# Patient Record
Sex: Female | Born: 1950 | Race: White | Hispanic: No | Smoking: Former smoker
Health system: Southern US, Community
[De-identification: ages and names within clinical notes are randomized; demographics above are authoritative.]

## PROBLEM LIST (undated history)

## (undated) DIAGNOSIS — Z8669 Personal history of other diseases of the nervous system and sense organs: Secondary | ICD-10-CM

## (undated) DIAGNOSIS — R350 Frequency of micturition: Secondary | ICD-10-CM

## (undated) DIAGNOSIS — M1990 Unspecified osteoarthritis, unspecified site: Secondary | ICD-10-CM

## (undated) DIAGNOSIS — R600 Localized edema: Secondary | ICD-10-CM

## (undated) DIAGNOSIS — R531 Weakness: Secondary | ICD-10-CM

## (undated) DIAGNOSIS — R51 Headache: Secondary | ICD-10-CM

## (undated) DIAGNOSIS — C801 Malignant (primary) neoplasm, unspecified: Secondary | ICD-10-CM

## (undated) DIAGNOSIS — M549 Dorsalgia, unspecified: Secondary | ICD-10-CM

## (undated) DIAGNOSIS — Z9289 Personal history of other medical treatment: Secondary | ICD-10-CM

## (undated) DIAGNOSIS — F32A Depression, unspecified: Secondary | ICD-10-CM

## (undated) DIAGNOSIS — R233 Spontaneous ecchymoses: Secondary | ICD-10-CM

## (undated) DIAGNOSIS — G629 Polyneuropathy, unspecified: Secondary | ICD-10-CM

## (undated) DIAGNOSIS — Z860100 Personal history of colon polyps, unspecified: Secondary | ICD-10-CM

## (undated) DIAGNOSIS — R238 Other skin changes: Secondary | ICD-10-CM

## (undated) DIAGNOSIS — F419 Anxiety disorder, unspecified: Secondary | ICD-10-CM

## (undated) DIAGNOSIS — R519 Headache, unspecified: Secondary | ICD-10-CM

## (undated) DIAGNOSIS — F329 Major depressive disorder, single episode, unspecified: Secondary | ICD-10-CM

## (undated) DIAGNOSIS — R3915 Urgency of urination: Secondary | ICD-10-CM

## (undated) DIAGNOSIS — M2550 Pain in unspecified joint: Secondary | ICD-10-CM

## (undated) DIAGNOSIS — G4700 Insomnia, unspecified: Secondary | ICD-10-CM

## (undated) DIAGNOSIS — Z8619 Personal history of other infectious and parasitic diseases: Secondary | ICD-10-CM

## (undated) DIAGNOSIS — K5900 Constipation, unspecified: Secondary | ICD-10-CM

## (undated) DIAGNOSIS — R609 Edema, unspecified: Secondary | ICD-10-CM

## (undated) DIAGNOSIS — K219 Gastro-esophageal reflux disease without esophagitis: Secondary | ICD-10-CM

## (undated) DIAGNOSIS — Z8614 Personal history of Methicillin resistant Staphylococcus aureus infection: Secondary | ICD-10-CM

## (undated) DIAGNOSIS — G8929 Other chronic pain: Secondary | ICD-10-CM

## (undated) HISTORY — PX: BUNIONECTOMY: SHX129

## (undated) HISTORY — PX: ESOPHAGOGASTRODUODENOSCOPY: SHX1529

## (undated) HISTORY — PX: OTHER SURGICAL HISTORY: SHX169

## (undated) HISTORY — DX: Malignant (primary) neoplasm, unspecified: C80.1

## (undated) HISTORY — PX: ROTATOR CUFF REPAIR: SHX139

## (undated) HISTORY — PX: ABDOMINAL HYSTERECTOMY: SHX81

## (undated) HISTORY — PX: TUBAL LIGATION: SHX77

## (undated) HISTORY — PX: TARSAL TUNNEL RELEASE: SUR1099

## (undated) HISTORY — PX: SPINE SURGERY: SHX786

## (undated) HISTORY — PX: COLONOSCOPY: SHX174

## (undated) HISTORY — PX: WRIST SURGERY: SHX841

## (undated) HISTORY — PX: CARPAL TUNNEL RELEASE: SHX101

---

## 1978-03-24 HISTORY — PX: APPENDECTOMY: SHX54

## 2004-03-24 HISTORY — PX: JOINT REPLACEMENT: SHX530

## 2007-03-25 DIAGNOSIS — Z8614 Personal history of Methicillin resistant Staphylococcus aureus infection: Secondary | ICD-10-CM

## 2007-03-25 DIAGNOSIS — Z8619 Personal history of other infectious and parasitic diseases: Secondary | ICD-10-CM

## 2007-03-25 HISTORY — DX: Personal history of Methicillin resistant Staphylococcus aureus infection: Z86.14

## 2007-03-25 HISTORY — DX: Personal history of other infectious and parasitic diseases: Z86.19

## 2008-04-26 ENCOUNTER — Inpatient Hospital Stay (HOSPITAL_COMMUNITY): Admission: RE | Admit: 2008-04-26 | Discharge: 2008-04-30 | Payer: Self-pay | Admitting: Orthopedic Surgery

## 2008-12-22 ENCOUNTER — Encounter: Admission: RE | Admit: 2008-12-22 | Discharge: 2008-12-22 | Payer: Self-pay | Admitting: Orthopedic Surgery

## 2009-05-10 ENCOUNTER — Ambulatory Visit (HOSPITAL_COMMUNITY): Admission: RE | Admit: 2009-05-10 | Discharge: 2009-05-11 | Payer: Self-pay | Admitting: Orthopedic Surgery

## 2010-02-06 ENCOUNTER — Inpatient Hospital Stay (HOSPITAL_COMMUNITY): Admission: EM | Admit: 2010-02-06 | Discharge: 2010-02-08 | Payer: Self-pay | Admitting: Emergency Medicine

## 2010-02-21 ENCOUNTER — Ambulatory Visit (HOSPITAL_COMMUNITY)
Admission: RE | Admit: 2010-02-21 | Discharge: 2010-02-21 | Payer: Self-pay | Source: Home / Self Care | Admitting: Orthopedic Surgery

## 2010-04-23 ENCOUNTER — Encounter
Admission: RE | Admit: 2010-04-23 | Discharge: 2010-04-23 | Payer: Self-pay | Source: Home / Self Care | Attending: Orthopedic Surgery | Admitting: Orthopedic Surgery

## 2010-04-28 IMAGING — CR DG LUMBAR SPINE 2-3V
2 series · 2 of 2 positions shown · non-contrast
Comparison: None

CLINICAL DATA: Scoliosis.  Stenosis.

LUMBAR SPINE - 2-3 VIEW

[view not recorded (1 of 2)]
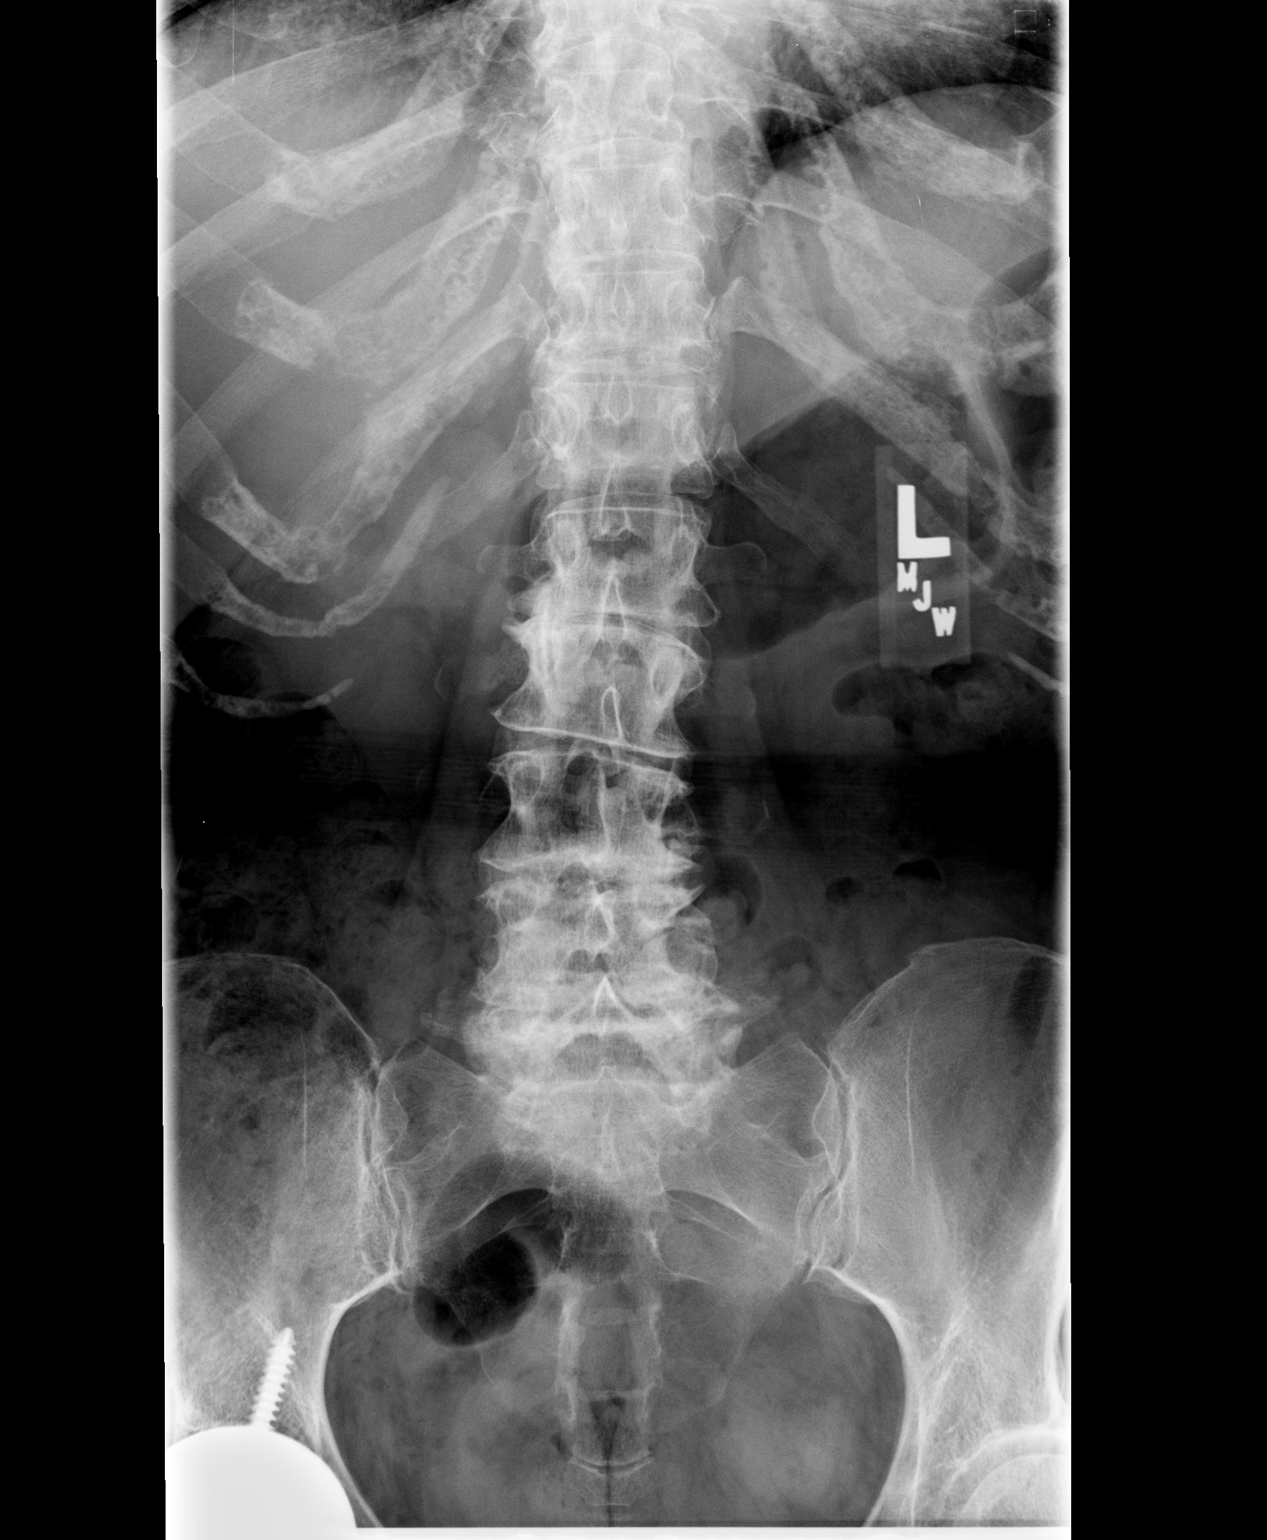

[view not recorded (2 of 2)]
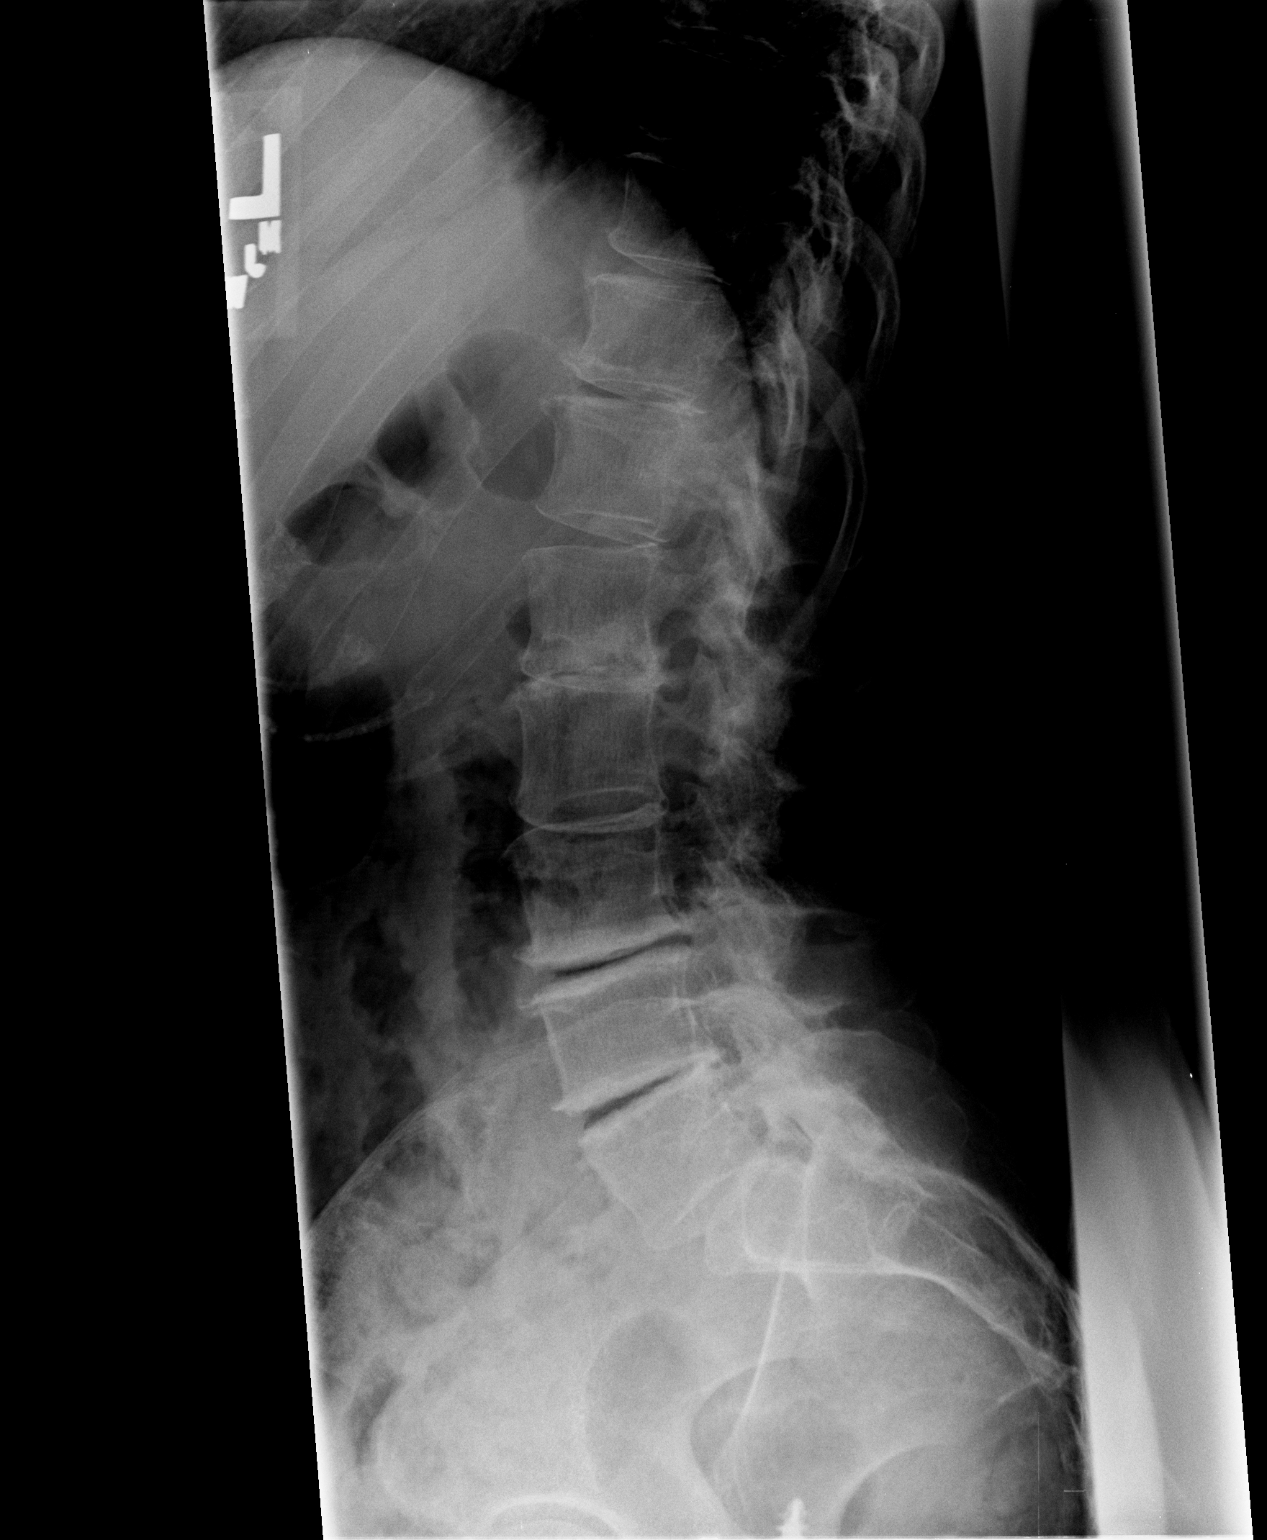

[2 of 2 positions shown; findings below may reference images not displayed]

FINDINGS: There is thoracolumbar scoliosis convex to the left and
lower lumbar scoliosis convex to the right.  There is disc space
narrowing throughout the lumbar spine.  There is facet arthropathy
in the lower lumbar spine.  There is 2 mm anterolisthesis of L5-S1.
IMPRESSION: Scoliosis, degenerative disc disease and degenerative facet
disease.  Levels marked for operative correlation.

## 2010-05-14 ENCOUNTER — Encounter (HOSPITAL_COMMUNITY)
Admission: RE | Admit: 2010-05-14 | Discharge: 2010-05-14 | Disposition: A | Discharge status: Home / Self Care | Payer: Medicare Other | Source: Ambulatory Visit | Attending: Orthopedic Surgery | Admitting: Orthopedic Surgery

## 2010-05-14 ENCOUNTER — Other Ambulatory Visit (HOSPITAL_COMMUNITY): Payer: Self-pay | Admitting: Orthopedic Surgery

## 2010-05-14 DIAGNOSIS — Z01812 Encounter for preprocedural laboratory examination: Secondary | ICD-10-CM | POA: Insufficient documentation

## 2010-05-14 DIAGNOSIS — Z01818 Encounter for other preprocedural examination: Secondary | ICD-10-CM

## 2010-05-14 LAB — CBC
HCT: 39.4 % (ref 36.0–46.0)
Hemoglobin: 13 g/dL (ref 12.0–15.0)
MCH: 29.5 pg (ref 26.0–34.0)
MCHC: 33 g/dL (ref 30.0–36.0)
MCV: 89.5 fL (ref 78.0–100.0)
Platelets: 331 10*3/uL (ref 150–400)
RBC: 4.4 MIL/uL (ref 3.87–5.11)
RDW: 14.4 % (ref 11.5–15.5)
WBC: 8 10*3/uL (ref 4.0–10.5)

## 2010-05-14 LAB — SURGICAL PCR SCREEN
MRSA, PCR: NEGATIVE
Staphylococcus aureus: NEGATIVE

## 2010-05-16 ENCOUNTER — Ambulatory Visit (HOSPITAL_COMMUNITY)
Admission: RE | Admit: 2010-05-16 | Discharge: 2010-05-17 | Disposition: A | Discharge status: Home / Self Care | Payer: Medicare Other | Source: Ambulatory Visit | Attending: Orthopedic Surgery | Admitting: Orthopedic Surgery

## 2010-05-16 ENCOUNTER — Ambulatory Visit (HOSPITAL_COMMUNITY): Payer: Medicare Other

## 2010-05-16 ENCOUNTER — Ambulatory Visit (HOSPITAL_COMMUNITY)
Admission: RE | Admit: 2010-05-16 | Discharge: 2010-05-16 | Disposition: A | Discharge status: Home / Self Care | Payer: Medicare Other | Source: Ambulatory Visit | Attending: Orthopedic Surgery | Admitting: Orthopedic Surgery

## 2010-05-16 ENCOUNTER — Other Ambulatory Visit (HOSPITAL_COMMUNITY): Payer: Self-pay | Admitting: Orthopedic Surgery

## 2010-05-16 DIAGNOSIS — IMO0001 Reserved for inherently not codable concepts without codable children: Secondary | ICD-10-CM | POA: Insufficient documentation

## 2010-05-16 DIAGNOSIS — H44009 Unspecified purulent endophthalmitis, unspecified eye: Secondary | ICD-10-CM | POA: Insufficient documentation

## 2010-05-16 DIAGNOSIS — Y838 Other surgical procedures as the cause of abnormal reaction of the patient, or of later complication, without mention of misadventure at the time of the procedure: Secondary | ICD-10-CM | POA: Insufficient documentation

## 2010-05-16 DIAGNOSIS — R52 Pain, unspecified: Secondary | ICD-10-CM

## 2010-05-16 DIAGNOSIS — K219 Gastro-esophageal reflux disease without esophagitis: Secondary | ICD-10-CM | POA: Insufficient documentation

## 2010-05-16 DIAGNOSIS — T8489XA Other specified complication of internal orthopedic prosthetic devices, implants and grafts, initial encounter: Secondary | ICD-10-CM | POA: Insufficient documentation

## 2010-05-16 DIAGNOSIS — Z87891 Personal history of nicotine dependence: Secondary | ICD-10-CM | POA: Insufficient documentation

## 2010-05-16 LAB — TYPE AND SCREEN
ABO/RH(D): A POS
Antibody Screen: NEGATIVE
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
## 2010-06-04 LAB — COMPREHENSIVE METABOLIC PANEL
ALT: 28 U/L (ref 0–35)
AST: 25 U/L (ref 0–37)
Albumin: 3 g/dL — ABNORMAL LOW (ref 3.5–5.2)
Alkaline Phosphatase: 42 U/L (ref 39–117)
BUN: 9 mg/dL (ref 6–23)
CO2: 23 mEq/L (ref 19–32)
Calcium: 7.9 mg/dL — ABNORMAL LOW (ref 8.4–10.5)
Chloride: 113 mEq/L — ABNORMAL HIGH (ref 96–112)
Creatinine, Ser: 0.53 mg/dL (ref 0.4–1.2)
GFR calc Af Amer: >60 mL/min (ref >60)
GFR calc non Af Amer: >60 mL/min (ref >60)
Glucose, Bld: 73 mg/dL (ref 70–99)
Potassium: 3 mEq/L — ABNORMAL LOW (ref 3.5–5.1)
Sodium: 142 mEq/L (ref 135–145)
Total Bilirubin: 0.4 mg/dL (ref 0.3–1.2)
Total Protein: 5.3 g/dL — ABNORMAL LOW (ref 6.0–8.3)

## 2010-06-04 LAB — DIFFERENTIAL
Basophils Absolute: 0 10*3/uL (ref 0.0–0.1)
Basophils Relative: 0 % (ref 0–1)
Eosinophils Absolute: 0 10*3/uL (ref 0.0–0.7)
Eosinophils Relative: 0 % (ref 0–5)
Lymphocytes Relative: 10 % — ABNORMAL LOW (ref 12–46)
Lymphs Abs: 1.2 10*3/uL (ref 0.7–4.0)
Monocytes Absolute: 1 10*3/uL (ref 0.1–1.0)
Monocytes Relative: 8 % (ref 3–12)
Neutro Abs: 10.6 10*3/uL — ABNORMAL HIGH (ref 1.7–7.7)
Neutrophils Relative %: 82 % — ABNORMAL HIGH (ref 43–77)

## 2010-06-04 LAB — CBC
HCT: 31.6 % — ABNORMAL LOW (ref 36.0–46.0)
HCT: 33.7 % — ABNORMAL LOW (ref 36.0–46.0)
Hemoglobin: 10.9 g/dL — ABNORMAL LOW (ref 12.0–15.0)
Hemoglobin: 11.5 g/dL — ABNORMAL LOW (ref 12.0–15.0)
MCH: 33 pg (ref 26.0–34.0)
MCH: 33.4 pg (ref 26.0–34.0)
MCHC: 34.1 g/dL (ref 30.0–36.0)
MCHC: 34.5 g/dL (ref 30.0–36.0)
MCV: 96.6 fL (ref 78.0–100.0)
MCV: 96.9 fL (ref 78.0–100.0)
Platelets: 204 10*3/uL (ref 150–400)
Platelets: 402 10*3/uL — ABNORMAL HIGH (ref 150–400)
RBC: 3.26 MIL/uL — ABNORMAL LOW (ref 3.87–5.11)
RBC: 3.49 MIL/uL — ABNORMAL LOW (ref 3.87–5.11)
RDW: 12.4 % (ref 11.5–15.5)
RDW: 12.5 % (ref 11.5–15.5)
WBC: 12.9 10*3/uL — ABNORMAL HIGH (ref 4.0–10.5)
WBC: 7.5 10*3/uL (ref 4.0–10.5)

## 2010-06-04 LAB — APTT: aPTT: 29 seconds (ref 24–37)

## 2010-06-04 LAB — SURGICAL PCR SCREEN
MRSA, PCR: NEGATIVE
Staphylococcus aureus: NEGATIVE

## 2010-06-04 LAB — PROTIME-INR
INR: 1.13 (ref 0.00–1.49)
Prothrombin Time: 14.7 seconds (ref 11.6–15.2)

## 2010-06-04 LAB — MRSA PCR SCREENING: MRSA by PCR: NEGATIVE

## 2010-06-12 LAB — CBC
HCT: 40.5 % (ref 36.0–46.0)
Hemoglobin: 14 g/dL (ref 12.0–15.0)
MCHC: 34.5 g/dL (ref 30.0–36.0)
MCV: 95.3 fL (ref 78.0–100.0)
Platelets: 254 10*3/uL (ref 150–400)
RBC: 4.25 MIL/uL (ref 3.87–5.11)
RDW: 12.4 % (ref 11.5–15.5)
WBC: 6.6 10*3/uL (ref 4.0–10.5)

## 2010-06-12 LAB — BASIC METABOLIC PANEL
BUN: 9 mg/dL (ref 6–23)
CO2: 28 mEq/L (ref 19–32)
Calcium: 10.2 mg/dL (ref 8.4–10.5)
Chloride: 106 mEq/L (ref 96–112)
Creatinine, Ser: 0.49 mg/dL (ref 0.4–1.2)
GFR calc Af Amer: >60 mL/min (ref >60)
GFR calc non Af Amer: >60 mL/min (ref >60)
Glucose, Bld: 98 mg/dL (ref 70–99)
Potassium: 4.3 mEq/L (ref 3.5–5.1)
Sodium: 142 mEq/L (ref 135–145)

## 2010-07-08 LAB — TYPE AND SCREEN
ABO/RH(D): A POS
Antibody Screen: NEGATIVE

## 2010-07-08 LAB — CBC
HCT: 41.3 % (ref 36.0–46.0)
Hemoglobin: 14.2 g/dL (ref 12.0–15.0)
MCHC: 34.3 g/dL (ref 30.0–36.0)
MCV: 96.2 fL (ref 78.0–100.0)
Platelets: 350 10*3/uL (ref 150–400)
RBC: 4.29 MIL/uL (ref 3.87–5.11)
RDW: 12.2 % (ref 11.5–15.5)
WBC: 8.8 10*3/uL (ref 4.0–10.5)

## 2010-07-08 LAB — ABO/RH: ABO/RH(D): A POS

## 2010-07-09 LAB — PROTIME-INR
INR: 1.1 (ref 0.00–1.49)
Prothrombin Time: 14.4 seconds (ref 11.6–15.2)

## 2010-07-09 LAB — DIFFERENTIAL
Basophils Absolute: 0 10*3/uL (ref 0.0–0.1)
Basophils Absolute: 0 10*3/uL (ref 0.0–0.1)
Basophils Absolute: 0 10*3/uL (ref 0.0–0.1)
Basophils Absolute: 0 10*3/uL (ref 0.0–0.1)
Basophils Absolute: 0.5 10*3/uL — ABNORMAL HIGH (ref 0.0–0.1)
Basophils Relative: 0 % (ref 0–1)
Basophils Relative: 0 % (ref 0–1)
Basophils Relative: 0 % (ref 0–1)
Basophils Relative: 0 % (ref 0–1)
Basophils Relative: 6 % — ABNORMAL HIGH (ref 0–1)
Eosinophils Absolute: 0 10*3/uL (ref 0.0–0.7)
Eosinophils Absolute: 0 10*3/uL (ref 0.0–0.7)
Eosinophils Absolute: 0 10*3/uL (ref 0.0–0.7)
Eosinophils Absolute: 0.2 10*3/uL (ref 0.0–0.7)
Eosinophils Absolute: 0.2 10*3/uL (ref 0.0–0.7)
Eosinophils Relative: 0 % (ref 0–5)
Eosinophils Relative: 0 % (ref 0–5)
Eosinophils Relative: 0 % (ref 0–5)
Eosinophils Relative: 2 % (ref 0–5)
Eosinophils Relative: 3 % (ref 0–5)
Lymphocytes Relative: 13 % (ref 12–46)
Lymphocytes Relative: 17 % (ref 12–46)
Lymphocytes Relative: 19 % (ref 12–46)
Lymphocytes Relative: 5 % — ABNORMAL LOW (ref 12–46)
Lymphocytes Relative: 7 % — ABNORMAL LOW (ref 12–46)
Lymphs Abs: 0.6 10*3/uL — ABNORMAL LOW (ref 0.7–4.0)
Lymphs Abs: 0.8 10*3/uL (ref 0.7–4.0)
Lymphs Abs: 1.3 10*3/uL (ref 0.7–4.0)
Lymphs Abs: 1.4 10*3/uL (ref 0.7–4.0)
Lymphs Abs: 1.5 10*3/uL (ref 0.7–4.0)
Monocytes Absolute: 0.5 10*3/uL (ref 0.1–1.0)
Monocytes Absolute: 0.6 10*3/uL (ref 0.1–1.0)
Monocytes Absolute: 0.8 10*3/uL (ref 0.1–1.0)
Monocytes Absolute: 1 10*3/uL (ref 0.1–1.0)
Monocytes Absolute: 1 10*3/uL (ref 0.1–1.0)
Monocytes Relative: 10 % (ref 3–12)
Monocytes Relative: 10 % (ref 3–12)
Monocytes Relative: 4 % (ref 3–12)
Monocytes Relative: 7 % (ref 3–12)
Monocytes Relative: 8 % (ref 3–12)
Neutro Abs: 10.6 10*3/uL — ABNORMAL HIGH (ref 1.7–7.7)
Neutro Abs: 11.6 10*3/uL — ABNORMAL HIGH (ref 1.7–7.7)
Neutro Abs: 5.3 10*3/uL (ref 1.7–7.7)
Neutro Abs: 6 10*3/uL (ref 1.7–7.7)
Neutro Abs: 7.7 10*3/uL (ref 1.7–7.7)
Neutrophils Relative %: 66 % (ref 43–77)
Neutrophils Relative %: 71 % (ref 43–77)
Neutrophils Relative %: 77 % (ref 43–77)
Neutrophils Relative %: 85 % — ABNORMAL HIGH (ref 43–77)
Neutrophils Relative %: 91 % — ABNORMAL HIGH (ref 43–77)

## 2010-07-09 LAB — BASIC METABOLIC PANEL
BUN: 4 mg/dL — ABNORMAL LOW (ref 6–23)
BUN: 5 mg/dL — ABNORMAL LOW (ref 6–23)
BUN: 6 mg/dL (ref 6–23)
BUN: 6 mg/dL (ref 6–23)
BUN: 6 mg/dL (ref 6–23)
CO2: 23 mEq/L (ref 19–32)
CO2: 29 mEq/L (ref 19–32)
CO2: 29 mEq/L (ref 19–32)
CO2: 29 mEq/L (ref 19–32)
CO2: 31 mEq/L (ref 19–32)
Calcium: 6.8 mg/dL — ABNORMAL LOW (ref 8.4–10.5)
Calcium: 7.2 mg/dL — ABNORMAL LOW (ref 8.4–10.5)
Calcium: 7.6 mg/dL — ABNORMAL LOW (ref 8.4–10.5)
Calcium: 7.7 mg/dL — ABNORMAL LOW (ref 8.4–10.5)
Calcium: 7.7 mg/dL — ABNORMAL LOW (ref 8.4–10.5)
Chloride: 100 mEq/L (ref 96–112)
Chloride: 102 mEq/L (ref 96–112)
Chloride: 111 mEq/L (ref 96–112)
Chloride: 96 mEq/L (ref 96–112)
Chloride: 98 mEq/L (ref 96–112)
Creatinine, Ser: 0.37 mg/dL — ABNORMAL LOW (ref 0.4–1.2)
Creatinine, Ser: 0.4 mg/dL (ref 0.4–1.2)
Creatinine, Ser: 0.44 mg/dL (ref 0.4–1.2)
Creatinine, Ser: 0.46 mg/dL (ref 0.4–1.2)
Creatinine, Ser: 0.5 mg/dL (ref 0.4–1.2)
GFR calc Af Amer: >60 mL/min (ref >60)
GFR calc Af Amer: >60 mL/min (ref >60)
GFR calc Af Amer: >60 mL/min (ref >60)
GFR calc Af Amer: >60 mL/min (ref >60)
GFR calc Af Amer: >60 mL/min (ref >60)
GFR calc non Af Amer: >60 mL/min (ref >60)
GFR calc non Af Amer: >60 mL/min (ref >60)
GFR calc non Af Amer: >60 mL/min (ref >60)
GFR calc non Af Amer: >60 mL/min (ref >60)
GFR calc non Af Amer: >60 mL/min (ref >60)
Glucose, Bld: 118 mg/dL — ABNORMAL HIGH (ref 70–99)
Glucose, Bld: 133 mg/dL — ABNORMAL HIGH (ref 70–99)
Glucose, Bld: 138 mg/dL — ABNORMAL HIGH (ref 70–99)
Glucose, Bld: 91 mg/dL (ref 70–99)
Glucose, Bld: 91 mg/dL (ref 70–99)
Potassium: 3.4 mEq/L — ABNORMAL LOW (ref 3.5–5.1)
Potassium: 3.6 mEq/L (ref 3.5–5.1)
Potassium: 3.6 mEq/L (ref 3.5–5.1)
Potassium: 3.9 mEq/L (ref 3.5–5.1)
Potassium: 4.2 mEq/L (ref 3.5–5.1)
Sodium: 130 mEq/L — ABNORMAL LOW (ref 135–145)
Sodium: 132 mEq/L — ABNORMAL LOW (ref 135–145)
Sodium: 136 mEq/L (ref 135–145)
Sodium: 136 mEq/L (ref 135–145)
Sodium: 137 mEq/L (ref 135–145)

## 2010-07-09 LAB — CROSSMATCH
ABO/RH(D): A POS
Antibody Screen: NEGATIVE

## 2010-07-09 LAB — POCT I-STAT 7, (LYTES, BLD GAS, ICA,H+H)
Acid-Base Excess: 3 mmol/L — ABNORMAL HIGH (ref 0.0–2.0)
Bicarbonate: 27.4 mEq/L — ABNORMAL HIGH (ref 20.0–24.0)
Calcium, Ion: 1.17 mmol/L (ref 1.12–1.32)
HCT: 22 % — ABNORMAL LOW (ref 36.0–46.0)
Hemoglobin: 7.5 g/dL — CL (ref 12.0–15.0)
O2 Saturation: 100 %
Patient temperature: 37
Potassium: 3.9 mEq/L (ref 3.5–5.1)
Sodium: 139 mEq/L (ref 135–145)
TCO2: 29 mmol/L (ref 0–100)
pCO2 arterial: 41.8 mmHg (ref 35.0–45.0)
pH, Arterial: 7.424 — ABNORMAL HIGH (ref 7.350–7.400)
pO2, Arterial: 453 mmHg — ABNORMAL HIGH (ref 80.0–100.0)

## 2010-07-09 LAB — CBC
HCT: 19.5 % — ABNORMAL LOW (ref 36.0–46.0)
HCT: 25 % — ABNORMAL LOW (ref 36.0–46.0)
HCT: 25.6 % — ABNORMAL LOW (ref 36.0–46.0)
HCT: 27.6 % — ABNORMAL LOW (ref 36.0–46.0)
HCT: 27.9 % — ABNORMAL LOW (ref 36.0–46.0)
Hemoglobin: 7 g/dL — CL (ref 12.0–15.0)
Hemoglobin: 8.8 g/dL — ABNORMAL LOW (ref 12.0–15.0)
Hemoglobin: 9.1 g/dL — ABNORMAL LOW (ref 12.0–15.0)
Hemoglobin: 9.8 g/dL — ABNORMAL LOW (ref 12.0–15.0)
Hemoglobin: 9.9 g/dL — ABNORMAL LOW (ref 12.0–15.0)
MCHC: 35.2 g/dL (ref 30.0–36.0)
MCHC: 35.3 g/dL (ref 30.0–36.0)
MCHC: 35.4 g/dL (ref 30.0–36.0)
MCHC: 35.5 g/dL (ref 30.0–36.0)
MCHC: 36.1 g/dL — ABNORMAL HIGH (ref 30.0–36.0)
MCV: 91.4 fL (ref 78.0–100.0)
MCV: 91.8 fL (ref 78.0–100.0)
MCV: 92.7 fL (ref 78.0–100.0)
MCV: 92.9 fL (ref 78.0–100.0)
MCV: 93.1 fL (ref 78.0–100.0)
Platelets: 104 10*3/uL — ABNORMAL LOW (ref 150–400)
Platelets: 109 10*3/uL — ABNORMAL LOW (ref 150–400)
Platelets: 122 10*3/uL — ABNORMAL LOW (ref 150–400)
Platelets: 146 10*3/uL — ABNORMAL LOW (ref 150–400)
Platelets: 94 10*3/uL — ABNORMAL LOW (ref 150–400)
RBC: 2.1 MIL/uL — ABNORMAL LOW (ref 3.87–5.11)
RBC: 2.72 MIL/uL — ABNORMAL LOW (ref 3.87–5.11)
RBC: 2.8 MIL/uL — ABNORMAL LOW (ref 3.87–5.11)
RBC: 2.98 MIL/uL — ABNORMAL LOW (ref 3.87–5.11)
RBC: 2.99 MIL/uL — ABNORMAL LOW (ref 3.87–5.11)
RDW: 13.1 % (ref 11.5–15.5)
RDW: 13.9 % (ref 11.5–15.5)
RDW: 14 % (ref 11.5–15.5)
RDW: 14.5 % (ref 11.5–15.5)
RDW: 15 % (ref 11.5–15.5)
WBC: 10 10*3/uL (ref 4.0–10.5)
WBC: 12.4 10*3/uL — ABNORMAL HIGH (ref 4.0–10.5)
WBC: 12.7 10*3/uL — ABNORMAL HIGH (ref 4.0–10.5)
WBC: 8 10*3/uL (ref 4.0–10.5)
WBC: 8.4 10*3/uL (ref 4.0–10.5)

## 2010-07-09 LAB — POCT I-STAT 4, (NA,K, GLUC, HGB,HCT)
Glucose, Bld: 112 mg/dL — ABNORMAL HIGH (ref 70–99)
HCT: 25 % — ABNORMAL LOW (ref 36.0–46.0)
Hemoglobin: 8.5 g/dL — ABNORMAL LOW (ref 12.0–15.0)
Potassium: 4.2 mEq/L (ref 3.5–5.1)
Sodium: 140 mEq/L (ref 135–145)

## 2010-07-09 LAB — BLEEDING TIME: Bleeding Time: 10.5 minutes — ABNORMAL HIGH (ref 2.5–9.5)

## 2010-07-09 LAB — HEMATOCRIT: HCT: 26.8 % — ABNORMAL LOW (ref 36.0–46.0)

## 2010-07-09 LAB — APTT: aPTT: 34 seconds (ref 24–37)

## 2010-08-06 NOTE — Op Note (Signed)
Amanda Guerrero, Amanda Guerrero                ACCOUNT NO.:  [id number]   MEDICAL RECORD NO.:  [id number]          PATIENT TYPE:  INP   LOCATION:  5040                         FACILITY:  [facility]   PHYSICIAN:  Alvy Beal, MD    DATE OF BIRTH:  1950/05/25   DATE OF PROCEDURE:  04/26/2008  DATE OF DISCHARGE:                               OPERATIVE REPORT   PREOPERATIVE DIAGNOSIS:  Degenerative spinal stenosis with scoliosis.   POSTOPERATIVE DIAGNOSIS:  Degenerative spinal stenosis with scoliosis.   OPERATIVE PROCEDURES:  1. Posterior instrumented fusion T10 through L5.  2. L2 through L5 posterior decompression.  3. Posterior arthrodesis, T10 through L5.   COMPLICATIONS:  None.   CONDITION:  Stable.   Intraoperative-evoked motor sensory and EMGs remained normal throughout  except for one.  There were diminished signals in the right upper  extremity and right lower extremity; however, this was felt due to the  change in anesthesia and hypotension.  Once both were resolved, the  signals returned to normal to their baseline.   It also be noted that each pedicle screw was tested  electrodiagnostically to determine whether or not there was pedicle  breach and ranges from 26-40 with no response indicating no evidence of  electrodiagnostic pedicle screw compromise.   HISTORY:  Amanda Guerrero is a very pleasant 60 year old woman with longstanding  back and bilateral leg and buttock pain.  The patient was diagnosed with  degenerative lumbar stenosis with scoliosis and neurogenic claudication.  After attempts at conservative management had failed to alleviate her  symptoms, she elected to proceed with surgery.  All appropriate risks,  benefits, and alternatives were discussed with the patient and consent  was obtained.   SURGEON:  Dahari D. Shon Baton, MD   FIRST ASSISTANT:  Crissie Reese, PA-C   At this point, the patient was brought to the operating room, placed  supine on the operating table.   After successful induction of general  anesthesia, endotracheal intubation, insertion of a triple-lumen  catheter and A-line, a Foley, SCDs, and TEDs were applied.  The arms  were secured to 5.5 [place] frame.  Spinal frame was secured over the  patient, and she was turned to the prone position.  The arms were placed  overhead.  Axillary rolls were placed, and all bony prominences were  well padded.  The back was prepped and draped in standard fashion.  A  midline incision was made starting at about the level of the T9 spinous  process and proceeding caudally down to the S1 spinous process.  Sharp  dissection was carried out down to the deep fascia.  The deep fascia was  clear and then using electrocautery, I began a subperiosteal dissection.  I used a Cobb and the cautery to dissect the rectus spinal muscles off  the spinous process from T10 down to L5.  I then carried the dissection  out over the lamina and exposed the facet complex.  I did this  bilaterally.  Once I had the bilateral thoracolumbar spine exposed, I  took an x-ray confirming the location of the L5 pedicle.  Once I  identified this L5 pedicle, I then proceeded to strip the L4-5 facet  complex and exposed out the transverse process of L5.  I then continued  this dissection cranially exposing the L4, L3, L2, and L1 transverse  processes and excising the facet capsule at the same time.  I then  dissected out to expose the T12, T11, and T10 transverse processes and  began decorticating the facet complex.  Once I had bilateral exposure to  the spine, I then proceeded with the instrumentation.   Using a [employer], I used fluoro and anatomical landmarks to  identify my appropriate starting position.  I then used an awl to break  through the cortex and then a pedicle probe to place a transpedicular  pedicle screw.  Appropriate trajectory was confirmed with the AP and  lateral x-rays.  Once I was at an appropriate depth  in the vertebral  body, I then went to L4 and in a similar fashion, placed the L4 pedicle  probe and then the L3, the L2, and the L1.  Once I had the lumbar spine  pedicle probes in position on the left-hand side, I then sequentially  removed each pedicle probe, palpated with a ball-tip feeler, tapped and  then re-palpated with a ball-tip feeler.  I then decorticated the  transverse process with a bur and then placed the appropriate size screw  at the level.  I repeated this entire procedure at the L4, L3, L2, and  L1 levels.  I then checked again in AP and lateral fluoroscopy,  confirmed satisfactory position and trajectory of the screws.  I then  used the electrodiagnostic equipment, grounded it, and then tested each  of the pedicle screws to ensure that there was no evidence of any  electrodiagnostically of pedicle breach and nerve irritation.  Once I  had confirmed this, I then proceeded to place the T12, T11, T10  pedicles.  I used a transpedicular approach.  I identified the margins  of the pedicle, decorticated with a high-speed bur, and then used the  leaky probe to advance down using the AP to identify my trajectory and  then once I confirmed I was at the medial wall of the pedicle and the  posterior aspect of the vertebral body, I then went into the body.  Once  I had placed the pedicle probes at the T11, 12, and 10 levels, I then  removed the probe, palpated with a ball-tip feeler, tapped, re-palpated  with ball-tip feeler and then placed the screw again the appropriate  size and length.  I then tested electrodiagnostically confirming that I  had satisfactory position with no evidence of any nerve irritation.  At  this point with the fixation from T10 through L5 completed, I then went  to the contralateral side and repeated the entire process instrumenting  from T10 down to L5.  Again, I tested all of the screws individually  electrodiagnostically.  There was no evidence of  nerve irritation or  pedicle breach and radiographically, all screws appeared to be in the  appropriate position and trajectory.   At this point with the hardware in place, I then proceeded with the  decompression.  Using a double-action Leksell rongeur, I resected the  spinous process of L5, 4, 3 and 2.  I saved this bone for my  arthrodesis.  I then performed a complete laminectomy of L5, L4, L3, and  L2.  With a 3-mm Kerrison rongeur, I  proceeded to complete the central  and lateral decompression.  I used a Kerrison rongeur to resect the  remaining portion of the lamina and completed the L5, 4, 3, 2  laminectomies.  At this point with the central decompression completed,  I then went into the lateral gutter and resected the osteophyte  thickened ligamentum flavum from the lateral gutter.  I then used a 2  and 3 mm Kerrison to perform foraminotomies at L2, L3, and L4 neural  foramen.  I then went to the contralateral side and did a lateral recess  and decompression from L2 through L5 and a foraminotomy at L2, L3, and  L4.  At this point, I could freely pass a Woodson elevator superiorly in  the lateral recess and out each neural foramen and there was no  significant tension or neural compressive bone spurs.   With the decompression complete, I then contoured the Vitallium 6.0 rod  and placed it on the right-hand side which was the concavity of the  major King 1 curve.  I then reduced this and locked it into place.  I  confirmed that the hardware was completely fixed and I torqued down all  the nuts.  I then packed the posterolateral gutter with a combination of  the local bone that I had harvested along with Actifuse.  Once I had the  posterolateral gutter packed with bone graft, I then contoured the rod  for the neutral side and locked it into place.  I then packed out the  left posterolateral gutter with bone graft and at this time, I needed  supplemental with cortical cancellus  bone chips.  Once the  posterolateral arthrodesis and instrumentation was complete, I placed 2  cross-links in order to augment the stiffness of the construct.  At this  point, I took an intraoperative AP and lateral x-ray, which demonstrated  satisfactory sagittal alignment and coronal alignment.  Hardware was in  good position.  At this point, I obtained hemostasis using bipolar  electrocautery and maintained it with FloSeal and thrombin-soaked  Gelfoam.  I placed 2 drains then closed the deep fascia with interrupted  #1 Vicryl sutures, superficial 2-0 Vicryl sutures, and the skin with  3-0 running Monocryl.  Steri-Strips, dry dressing were applied, and the  patient was extubated and transferred to the PACU without incident.  At  the end of the case, all needle and sponge counts were correct.  There  were no significant complications.      Alvy Beal, MD  Electronically Signed     DDB/MEDQ  D:  04/26/2008  T:  04/27/2008  Job:  [id number]

## 2010-08-06 NOTE — H&P (Signed)
[name], Guerrero                ACCOUNT NO.:  [id number]   MEDICAL RECORD NO.:  [id number]          PATIENT TYPE:  INP   LOCATION:  NA                           FACILITY:  [facility]   PHYSICIAN:  Alvy Beal, MD    DATE OF BIRTH:  03-08-1951   DATE OF ADMISSION:  04/26/2008  DATE OF DISCHARGE:                              HISTORY & PHYSICAL   The patient is to undergo a surgery at [facility] on April 26, 2008.   CHIEF COMPLAINT:  Chronic low back pain and buttock and bilateral leg  pain.   HISTORY OF PRESENT ILLNESS:  Amanda Guerrero is a very pleasant 60 year old female  who has been disabled for a longtime due to chronic pain issues.  The  patient has been having pain in her back for several years now, but she  has had no formal treatment for this and unfortunately she has just been  living with the pain.  Dr. Shon Baton has recently treated her daughter and  on her recommendation, the patient began seeing Dr. Shon Baton for treatment  for her low back pain.  The patient has no history of incontinence or  bowel or bladder problems.  She had significant stiffness in the lumbar  spine with no loss of range of motion.  An MRI was obtained dated  September, 2009 which demonstrated multilevel degenerative lumbar disk  disease at L2-L3, L3-L4, and L4-L5 with significant spinal stenosis at  L3-L4 and L4-L5.  There was also a degenerative scoliosis in the lower  lumbar spine.  The patient was initially started on appropriate  conservative therapy consisting of physical therapy, medications, and  injection therapy.  Unfortunately despite the conservative care that was  rendered by Dr. Shon Baton over the last few months, the patient still  having progressive back, buttock, and hamstring pain.  Dr. Shon Baton felt  that the spinal stenosis at the L2-L3, L3-L4, and L4-L5 was the major  symptom for her neurogenic claudication.  However, Dr. Shon Baton does not  feel that decompression alone would relieve  the pressure on the nerve  and it could possibly make the scoliosis more asymptomatic, so in  addition to the decompression Dr. Shon Baton feels that he has to address  the curve and that would entail a T10 through S1 instrumented fusion  with an L2 through L4 decompression and a possible L5-S1 TLIF.   PAST MEDICAL HISTORY:  Includes skin cancer, basal cell carcinoma in  remission, reflux, night sweats, arthritis, mild incontinence, bowel or  bladder problems which has necessitated by the bladder track.  The  patient also has a history of migraines, impaired vision, anxiety,  depression, dentures as well as osteoporosis, arthritis, and bursitis.  The patient also has a past medical history of MRSA infection in January  2009.   CURRENT MEDICATIONS:  1. Lyrica 200 mg twice a day.  2. Cymbalta 60 mg.  3. Nexium 40 mg twice a day.  4. Lunesta 3 mg once at night.  5. Actonel 150 mg once a month.  6. Tramadol 50 mg 1 tablet every 8 hours  as needed.   ALLERGIES:  The patient has no known medical allergies, no known food  allergies, and no known latex allergies.   FAMILY HISTORY:  Father was deceased at 103 because of cancer.  Mother  died at 98 because of a heart bypass surgery.  Her other siblings are  otherwise healthy.  She has three living children.  The patient is  currently disabled and separated, and living at home with her two sons.  She lives in a one-level home with four steps entering her facility.  The patient is a previous smoker.  She smoked approximately half a pack  per day for 35 years.   REVIEW OF SYSTEMS:  In the office today, general was otherwise negative  for fevers, chills, weight loss, loss of memory, or fatigue.  It was  positive for night sweats.  HEENT was positive for headache, blurred  vision, insomnia, and balance problems.  Dentures was otherwise negative  for double vision, dizziness, hearing loss, paralysis, tremor, blackouts  spells, or ringing in the  years.  Derm was otherwise negative for  rashes, itching, hives, lesions, or eczema.  Respiratory was negative  for shortness of breath, cough, wheezing, coughing up blood, and/or  allergies.  Cardiovascular was otherwise negative for chest pains,  palpitations, difficulty breathing lying flat, any swellings or heart  murmurs.  GI was otherwise negative for nausea, vomiting, diarrhea,  blood in stool, jaundice, difficulty swallowing, loss of appetite, or  abdominal pain.  It was positive for constipation and heartburn.  GU was  positive for incontinence.  Again, this is due to bladder-neck problems.  It was otherwise negative for painful urination, urinary frequency,  blood in the urine, urinary discharge, weak stream, urinating at night,  flank pain, or urinary retention.  Musculoskeletal, the patient has  multiple joint pains.  It was positive for muscular pain, back pain,  spasms, morning stiffness,, and positive for neurogenic claudication.   PHYSICAL EXAMINATION:  GENERAL:  In the office today, the patient is  alert and oriented x3, in no acute distress.  She is able to ambulate  without any antalgic gait.  [name] is a very pleasant 60 year old  female who appears younger than her stated age.  She is in no acute  distress.  She is alert and oriented x3.  Cranial nerves II through XII  are tested and grossly intact.  VITAL SIGNS:  Blood pressure today in the office was 115/80, respiratory  rate was 16, and pulse was 80.  HEAD:  Normocephalic and atraumatic.  RESPIRATORY:  Lungs were clear to auscultation.  No wheezes, crackles,  or vocal fremitus is present.  CARDIOVASCULAR:  Regular rate and rhythm.  No murmurs, rubs, or gallops  were present.  NECK:  Supple.  ABDOMEN:  Soft and nontender.  Bowel sounds are present in all 4  quadrants.  No masses were present.  MUSCULOSKELETAL:  She has significant stiffness in the lumbar spine.  No  loss of range of motion.  No hip, knee, or  ankle pain with isolated  joint range of motion.  NEUROLOGIC:  She is intact with no focal or motor neurosensory deficits.  In the lower extremity, she has negative nerve root tension signs.  She  has 2+ symmetrical deep tendon reflexes in the lower extremity.  No  clonus.  Negative Babinski.  Intact peripheral pulses bilaterally.   ASSESSMENT:  Degenerative spinal stenosis.   PLAN:  The plan is for the patient to undergo a  T10-S1 instrumented  fusion with the L2-L4 decompression and possible L5-S1 TLIF.      Crissie Reese, PA      Alvy Beal, MD  Electronically Signed    AC/MEDQ  D:  04/17/2008  T:  04/18/2008  Job:  [id number]

## 2010-08-09 NOTE — Discharge Summary (Signed)
[name], [name]                ACCOUNT NO.:  [id number]   MEDICAL RECORD NO.:  [id number]          PATIENT TYPE:  INP   LOCATION:  5040                         FACILITY:  [facility]   PHYSICIAN:  Amanda Beal, MD    DATE OF BIRTH:  04-03-1950   DATE OF ADMISSION:  04/26/2008  DATE OF DISCHARGE:  04/30/2008                               DISCHARGE SUMMARY   ADMISSION DIAGNOSIS:  Degenerative spinal stenosis with scoliosis.   DISCHARGE DIAGNOSES:  Degenerative spinal stenosis with scoliosis with  the addition of postoperative blood loss anemia.   OPERATIVE PROCEDURE:  Posterior instrumented fusion at T10 through L5,  L2 through L5 posterior decompression, and posterior arthrodesis at T10  through L5.   BRIEF HISTORY:  Amanda Guerrero is a very pleasant 60 year old female with a  longstanding back and bilateral buttock pain.  She was diagnosed with  degenerative lumbar stenosis with scoliosis and neurogenic claudication.  After all attempts at conservative management had failed to alleviate  her pain, she had elected to proceed with surgery.  All appropriate  risks, benefits, and alternatives were discussed and the patient consent  was obtained.   BRIEF HOSPITAL COURSE:  The patient's hospital course was approximately  4 days in length.  Postoperatively day #1, she was doing well.  However,  because of some mild increase in pain, physical therapy was held.  Postoperatively day #2, it was noted that her hemoglobin and hematocrit  had dropped from a preoperative level of 14.2 hemoglobin and hematocrit  of 41.3 to 7.5 hemoglobin and 22 hematocrit.  Therefore, she was  transfused 2 units of PRBCs and tolerated that very well.  Postoperatively day #3, she continued to work with physical therapy and  made gains.  Her other labs have remained stable.  Her hemoglobin and  hematocrit had improved and remained stable for the rest of her hospital  course.  Throughout her stay, she had no  compartment tenderness.  She  was tolerating a regular diet.  Abdomen remained soft and nontender.  She had no shortness of breath and/or chest pain.  Postoperatively day  #4, she was having regular bowel movements and having no other  complaints.  Therefore, she was deemed stable to be discharged home with  a home health service.   DISPOSITION:  The patient was discharged to home with home health  service.   DISCHARGE INSTRUCTIONS:  The patient was given a preprinted list of  discharge instructions that was thoroughly went over that she is allowed  to shower postoperatively day #3.  She is to keep her incision clean and  dry at all times, change the dressing for 7 days.  She is on back  precautions, no bending, twisting, stooping, or squatting.  She is to  continue using her lumbar brace when ambulating.  She is only to walk  short distances.  No lifting over 6 pounds.  The patient is to call the  office for increased fevers or chills or increased pain.   DISCHARGE MEDICATIONS:  The patient was discharged on:  1. Lyrica 200 mg.  2.  Cymbalta 60 mg.  3. Nexium 40 mg.  4. Lunesta 3 mg.  5. Actonel 150 mg.  6. Over-the-counter vitamin.  She was instructed to discontinue her tramadol.   She is also discharged on a new medication of Percocet 1-2 tablets  10/325 one every 4-6 hours as needed for pain and Robaxin 500 mg 1  tablet every 6 hours as needed for spasms.  She is also discharged on  Colace 100 mg b.i.d. and she is also discharged on iron 325 mg twice  daily for 3 weeks.   FOLLOWUP:  The patient is to call the office at [phone] to set up her  follow up.       Amanda Reese, PA      Amanda Beal, MD  Electronically Signed    AC/MEDQ  D:  06/02/2008  T:  06/03/2008  Job:  [id number]

## 2011-04-08 DIAGNOSIS — K219 Gastro-esophageal reflux disease without esophagitis: Secondary | ICD-10-CM | POA: Diagnosis not present

## 2011-04-08 DIAGNOSIS — M810 Age-related osteoporosis without current pathological fracture: Secondary | ICD-10-CM | POA: Diagnosis not present

## 2011-04-08 DIAGNOSIS — R252 Cramp and spasm: Secondary | ICD-10-CM | POA: Diagnosis not present

## 2011-04-08 DIAGNOSIS — G4700 Insomnia, unspecified: Secondary | ICD-10-CM | POA: Diagnosis not present

## 2011-04-22 DIAGNOSIS — Z1231 Encounter for screening mammogram for malignant neoplasm of breast: Secondary | ICD-10-CM | POA: Diagnosis not present

## 2011-04-24 DIAGNOSIS — M961 Postlaminectomy syndrome, not elsewhere classified: Secondary | ICD-10-CM | POA: Diagnosis not present

## 2011-05-07 DIAGNOSIS — G4700 Insomnia, unspecified: Secondary | ICD-10-CM | POA: Diagnosis not present

## 2011-05-07 DIAGNOSIS — M810 Age-related osteoporosis without current pathological fracture: Secondary | ICD-10-CM | POA: Diagnosis not present

## 2011-05-07 DIAGNOSIS — K219 Gastro-esophageal reflux disease without esophagitis: Secondary | ICD-10-CM | POA: Diagnosis not present

## 2011-05-07 DIAGNOSIS — IMO0002 Reserved for concepts with insufficient information to code with codable children: Secondary | ICD-10-CM | POA: Diagnosis not present

## 2011-06-04 DIAGNOSIS — G4700 Insomnia, unspecified: Secondary | ICD-10-CM | POA: Diagnosis not present

## 2011-06-04 DIAGNOSIS — K219 Gastro-esophageal reflux disease without esophagitis: Secondary | ICD-10-CM | POA: Diagnosis not present

## 2011-06-04 DIAGNOSIS — F411 Generalized anxiety disorder: Secondary | ICD-10-CM | POA: Diagnosis not present

## 2011-06-04 DIAGNOSIS — M159 Polyosteoarthritis, unspecified: Secondary | ICD-10-CM | POA: Diagnosis not present

## 2011-06-18 DIAGNOSIS — M961 Postlaminectomy syndrome, not elsewhere classified: Secondary | ICD-10-CM | POA: Diagnosis not present

## 2011-06-27 DIAGNOSIS — G894 Chronic pain syndrome: Secondary | ICD-10-CM | POA: Diagnosis not present

## 2011-07-02 DIAGNOSIS — M25519 Pain in unspecified shoulder: Secondary | ICD-10-CM | POA: Diagnosis not present

## 2011-07-07 DIAGNOSIS — R252 Cramp and spasm: Secondary | ICD-10-CM | POA: Diagnosis not present

## 2011-07-07 DIAGNOSIS — G4700 Insomnia, unspecified: Secondary | ICD-10-CM | POA: Diagnosis not present

## 2011-07-07 DIAGNOSIS — K219 Gastro-esophageal reflux disease without esophagitis: Secondary | ICD-10-CM | POA: Diagnosis not present

## 2011-07-07 DIAGNOSIS — M810 Age-related osteoporosis without current pathological fracture: Secondary | ICD-10-CM | POA: Diagnosis not present

## 2011-07-21 DIAGNOSIS — M25519 Pain in unspecified shoulder: Secondary | ICD-10-CM | POA: Diagnosis not present

## 2011-07-31 DIAGNOSIS — M25519 Pain in unspecified shoulder: Secondary | ICD-10-CM | POA: Diagnosis not present

## 2011-08-04 DIAGNOSIS — G4700 Insomnia, unspecified: Secondary | ICD-10-CM | POA: Diagnosis not present

## 2011-08-04 DIAGNOSIS — K219 Gastro-esophageal reflux disease without esophagitis: Secondary | ICD-10-CM | POA: Diagnosis not present

## 2011-08-04 DIAGNOSIS — F411 Generalized anxiety disorder: Secondary | ICD-10-CM | POA: Diagnosis not present

## 2011-08-04 DIAGNOSIS — M810 Age-related osteoporosis without current pathological fracture: Secondary | ICD-10-CM | POA: Diagnosis not present

## 2011-08-07 DIAGNOSIS — M25519 Pain in unspecified shoulder: Secondary | ICD-10-CM | POA: Diagnosis not present

## 2011-08-19 DIAGNOSIS — S43429A Sprain of unspecified rotator cuff capsule, initial encounter: Secondary | ICD-10-CM | POA: Diagnosis not present

## 2011-09-02 DIAGNOSIS — M159 Polyosteoarthritis, unspecified: Secondary | ICD-10-CM | POA: Diagnosis not present

## 2011-09-02 DIAGNOSIS — F329 Major depressive disorder, single episode, unspecified: Secondary | ICD-10-CM | POA: Diagnosis not present

## 2011-09-02 DIAGNOSIS — K219 Gastro-esophageal reflux disease without esophagitis: Secondary | ICD-10-CM | POA: Diagnosis not present

## 2011-09-02 DIAGNOSIS — Z01810 Encounter for preprocedural cardiovascular examination: Secondary | ICD-10-CM | POA: Diagnosis not present

## 2011-09-15 DIAGNOSIS — M719 Bursopathy, unspecified: Secondary | ICD-10-CM | POA: Diagnosis not present

## 2011-09-15 DIAGNOSIS — M67919 Unspecified disorder of synovium and tendon, unspecified shoulder: Secondary | ICD-10-CM | POA: Diagnosis not present

## 2011-09-15 DIAGNOSIS — S43439A Superior glenoid labrum lesion of unspecified shoulder, initial encounter: Secondary | ICD-10-CM | POA: Diagnosis not present

## 2011-09-15 DIAGNOSIS — M25819 Other specified joint disorders, unspecified shoulder: Secondary | ICD-10-CM | POA: Diagnosis not present

## 2011-09-15 DIAGNOSIS — M19019 Primary osteoarthritis, unspecified shoulder: Secondary | ICD-10-CM | POA: Diagnosis not present

## 2011-09-15 DIAGNOSIS — M24119 Other articular cartilage disorders, unspecified shoulder: Secondary | ICD-10-CM | POA: Diagnosis not present

## 2011-09-15 DIAGNOSIS — S43499A Other sprain of unspecified shoulder joint, initial encounter: Secondary | ICD-10-CM | POA: Diagnosis not present

## 2011-09-15 DIAGNOSIS — S46819A Strain of other muscles, fascia and tendons at shoulder and upper arm level, unspecified arm, initial encounter: Secondary | ICD-10-CM | POA: Diagnosis not present

## 2011-09-15 DIAGNOSIS — M75100 Unspecified rotator cuff tear or rupture of unspecified shoulder, not specified as traumatic: Secondary | ICD-10-CM | POA: Diagnosis not present

## 2011-09-18 DIAGNOSIS — M75120 Complete rotator cuff tear or rupture of unspecified shoulder, not specified as traumatic: Secondary | ICD-10-CM | POA: Diagnosis not present

## 2011-09-18 DIAGNOSIS — Z4789 Encounter for other orthopedic aftercare: Secondary | ICD-10-CM | POA: Diagnosis not present

## 2011-09-18 DIAGNOSIS — IMO0001 Reserved for inherently not codable concepts without codable children: Secondary | ICD-10-CM | POA: Diagnosis not present

## 2011-09-18 DIAGNOSIS — M6281 Muscle weakness (generalized): Secondary | ICD-10-CM | POA: Diagnosis not present

## 2011-09-18 DIAGNOSIS — M25519 Pain in unspecified shoulder: Secondary | ICD-10-CM | POA: Diagnosis not present

## 2011-09-18 DIAGNOSIS — M25619 Stiffness of unspecified shoulder, not elsewhere classified: Secondary | ICD-10-CM | POA: Diagnosis not present

## 2011-09-19 DIAGNOSIS — M75120 Complete rotator cuff tear or rupture of unspecified shoulder, not specified as traumatic: Secondary | ICD-10-CM | POA: Diagnosis not present

## 2011-09-19 DIAGNOSIS — M25519 Pain in unspecified shoulder: Secondary | ICD-10-CM | POA: Diagnosis not present

## 2011-09-19 DIAGNOSIS — IMO0001 Reserved for inherently not codable concepts without codable children: Secondary | ICD-10-CM | POA: Diagnosis not present

## 2011-09-19 DIAGNOSIS — M6281 Muscle weakness (generalized): Secondary | ICD-10-CM | POA: Diagnosis not present

## 2011-09-19 DIAGNOSIS — M25619 Stiffness of unspecified shoulder, not elsewhere classified: Secondary | ICD-10-CM | POA: Diagnosis not present

## 2011-09-19 DIAGNOSIS — Z4789 Encounter for other orthopedic aftercare: Secondary | ICD-10-CM | POA: Diagnosis not present

## 2011-09-22 DIAGNOSIS — M6281 Muscle weakness (generalized): Secondary | ICD-10-CM | POA: Diagnosis not present

## 2011-09-22 DIAGNOSIS — M75120 Complete rotator cuff tear or rupture of unspecified shoulder, not specified as traumatic: Secondary | ICD-10-CM | POA: Diagnosis not present

## 2011-09-22 DIAGNOSIS — Z4789 Encounter for other orthopedic aftercare: Secondary | ICD-10-CM | POA: Diagnosis not present

## 2011-09-22 DIAGNOSIS — M25519 Pain in unspecified shoulder: Secondary | ICD-10-CM | POA: Diagnosis not present

## 2011-09-22 DIAGNOSIS — IMO0001 Reserved for inherently not codable concepts without codable children: Secondary | ICD-10-CM | POA: Diagnosis not present

## 2011-09-22 DIAGNOSIS — M25619 Stiffness of unspecified shoulder, not elsewhere classified: Secondary | ICD-10-CM | POA: Diagnosis not present

## 2011-09-24 DIAGNOSIS — M25619 Stiffness of unspecified shoulder, not elsewhere classified: Secondary | ICD-10-CM | POA: Diagnosis not present

## 2011-09-24 DIAGNOSIS — M25519 Pain in unspecified shoulder: Secondary | ICD-10-CM | POA: Diagnosis not present

## 2011-09-24 DIAGNOSIS — Z4789 Encounter for other orthopedic aftercare: Secondary | ICD-10-CM | POA: Diagnosis not present

## 2011-09-24 DIAGNOSIS — IMO0001 Reserved for inherently not codable concepts without codable children: Secondary | ICD-10-CM | POA: Diagnosis not present

## 2011-09-24 DIAGNOSIS — M75120 Complete rotator cuff tear or rupture of unspecified shoulder, not specified as traumatic: Secondary | ICD-10-CM | POA: Diagnosis not present

## 2011-09-24 DIAGNOSIS — M6281 Muscle weakness (generalized): Secondary | ICD-10-CM | POA: Diagnosis not present

## 2011-09-26 DIAGNOSIS — M75120 Complete rotator cuff tear or rupture of unspecified shoulder, not specified as traumatic: Secondary | ICD-10-CM | POA: Diagnosis not present

## 2011-09-26 DIAGNOSIS — IMO0001 Reserved for inherently not codable concepts without codable children: Secondary | ICD-10-CM | POA: Diagnosis not present

## 2011-09-26 DIAGNOSIS — M25619 Stiffness of unspecified shoulder, not elsewhere classified: Secondary | ICD-10-CM | POA: Diagnosis not present

## 2011-09-26 DIAGNOSIS — Z4789 Encounter for other orthopedic aftercare: Secondary | ICD-10-CM | POA: Diagnosis not present

## 2011-09-26 DIAGNOSIS — M25519 Pain in unspecified shoulder: Secondary | ICD-10-CM | POA: Diagnosis not present

## 2011-09-26 DIAGNOSIS — M6281 Muscle weakness (generalized): Secondary | ICD-10-CM | POA: Diagnosis not present

## 2011-09-30 DIAGNOSIS — IMO0001 Reserved for inherently not codable concepts without codable children: Secondary | ICD-10-CM | POA: Diagnosis not present

## 2011-09-30 DIAGNOSIS — M25519 Pain in unspecified shoulder: Secondary | ICD-10-CM | POA: Diagnosis not present

## 2011-09-30 DIAGNOSIS — Z4789 Encounter for other orthopedic aftercare: Secondary | ICD-10-CM | POA: Diagnosis not present

## 2011-09-30 DIAGNOSIS — M810 Age-related osteoporosis without current pathological fracture: Secondary | ICD-10-CM | POA: Diagnosis not present

## 2011-09-30 DIAGNOSIS — G4700 Insomnia, unspecified: Secondary | ICD-10-CM | POA: Diagnosis not present

## 2011-09-30 DIAGNOSIS — M75120 Complete rotator cuff tear or rupture of unspecified shoulder, not specified as traumatic: Secondary | ICD-10-CM | POA: Diagnosis not present

## 2011-09-30 DIAGNOSIS — M159 Polyosteoarthritis, unspecified: Secondary | ICD-10-CM | POA: Diagnosis not present

## 2011-09-30 DIAGNOSIS — M25619 Stiffness of unspecified shoulder, not elsewhere classified: Secondary | ICD-10-CM | POA: Diagnosis not present

## 2011-09-30 DIAGNOSIS — K219 Gastro-esophageal reflux disease without esophagitis: Secondary | ICD-10-CM | POA: Diagnosis not present

## 2011-09-30 DIAGNOSIS — M6281 Muscle weakness (generalized): Secondary | ICD-10-CM | POA: Diagnosis not present

## 2011-10-01 DIAGNOSIS — IMO0001 Reserved for inherently not codable concepts without codable children: Secondary | ICD-10-CM | POA: Diagnosis not present

## 2011-10-01 DIAGNOSIS — M25519 Pain in unspecified shoulder: Secondary | ICD-10-CM | POA: Diagnosis not present

## 2011-10-01 DIAGNOSIS — M6281 Muscle weakness (generalized): Secondary | ICD-10-CM | POA: Diagnosis not present

## 2011-10-01 DIAGNOSIS — Z4789 Encounter for other orthopedic aftercare: Secondary | ICD-10-CM | POA: Diagnosis not present

## 2011-10-01 DIAGNOSIS — M75120 Complete rotator cuff tear or rupture of unspecified shoulder, not specified as traumatic: Secondary | ICD-10-CM | POA: Diagnosis not present

## 2011-10-01 DIAGNOSIS — M25619 Stiffness of unspecified shoulder, not elsewhere classified: Secondary | ICD-10-CM | POA: Diagnosis not present

## 2011-10-03 DIAGNOSIS — Z4789 Encounter for other orthopedic aftercare: Secondary | ICD-10-CM | POA: Diagnosis not present

## 2011-10-03 DIAGNOSIS — M75120 Complete rotator cuff tear or rupture of unspecified shoulder, not specified as traumatic: Secondary | ICD-10-CM | POA: Diagnosis not present

## 2011-10-03 DIAGNOSIS — M25619 Stiffness of unspecified shoulder, not elsewhere classified: Secondary | ICD-10-CM | POA: Diagnosis not present

## 2011-10-03 DIAGNOSIS — M6281 Muscle weakness (generalized): Secondary | ICD-10-CM | POA: Diagnosis not present

## 2011-10-03 DIAGNOSIS — M25519 Pain in unspecified shoulder: Secondary | ICD-10-CM | POA: Diagnosis not present

## 2011-10-03 DIAGNOSIS — IMO0001 Reserved for inherently not codable concepts without codable children: Secondary | ICD-10-CM | POA: Diagnosis not present

## 2011-10-07 DIAGNOSIS — M25619 Stiffness of unspecified shoulder, not elsewhere classified: Secondary | ICD-10-CM | POA: Diagnosis not present

## 2011-10-07 DIAGNOSIS — M25519 Pain in unspecified shoulder: Secondary | ICD-10-CM | POA: Diagnosis not present

## 2011-10-07 DIAGNOSIS — M75120 Complete rotator cuff tear or rupture of unspecified shoulder, not specified as traumatic: Secondary | ICD-10-CM | POA: Diagnosis not present

## 2011-10-07 DIAGNOSIS — M6281 Muscle weakness (generalized): Secondary | ICD-10-CM | POA: Diagnosis not present

## 2011-10-07 DIAGNOSIS — IMO0001 Reserved for inherently not codable concepts without codable children: Secondary | ICD-10-CM | POA: Diagnosis not present

## 2011-10-07 DIAGNOSIS — Z4789 Encounter for other orthopedic aftercare: Secondary | ICD-10-CM | POA: Diagnosis not present

## 2011-10-09 DIAGNOSIS — M25619 Stiffness of unspecified shoulder, not elsewhere classified: Secondary | ICD-10-CM | POA: Diagnosis not present

## 2011-10-09 DIAGNOSIS — M25519 Pain in unspecified shoulder: Secondary | ICD-10-CM | POA: Diagnosis not present

## 2011-10-09 DIAGNOSIS — M75120 Complete rotator cuff tear or rupture of unspecified shoulder, not specified as traumatic: Secondary | ICD-10-CM | POA: Diagnosis not present

## 2011-10-09 DIAGNOSIS — IMO0001 Reserved for inherently not codable concepts without codable children: Secondary | ICD-10-CM | POA: Diagnosis not present

## 2011-10-09 DIAGNOSIS — M6281 Muscle weakness (generalized): Secondary | ICD-10-CM | POA: Diagnosis not present

## 2011-10-09 DIAGNOSIS — Z4789 Encounter for other orthopedic aftercare: Secondary | ICD-10-CM | POA: Diagnosis not present

## 2011-10-10 DIAGNOSIS — M75120 Complete rotator cuff tear or rupture of unspecified shoulder, not specified as traumatic: Secondary | ICD-10-CM | POA: Diagnosis not present

## 2011-10-10 DIAGNOSIS — Z4789 Encounter for other orthopedic aftercare: Secondary | ICD-10-CM | POA: Diagnosis not present

## 2011-10-10 DIAGNOSIS — IMO0001 Reserved for inherently not codable concepts without codable children: Secondary | ICD-10-CM | POA: Diagnosis not present

## 2011-10-10 DIAGNOSIS — M25619 Stiffness of unspecified shoulder, not elsewhere classified: Secondary | ICD-10-CM | POA: Diagnosis not present

## 2011-10-10 DIAGNOSIS — M25519 Pain in unspecified shoulder: Secondary | ICD-10-CM | POA: Diagnosis not present

## 2011-10-10 DIAGNOSIS — M6281 Muscle weakness (generalized): Secondary | ICD-10-CM | POA: Diagnosis not present

## 2011-10-13 DIAGNOSIS — M961 Postlaminectomy syndrome, not elsewhere classified: Secondary | ICD-10-CM | POA: Diagnosis not present

## 2011-10-13 DIAGNOSIS — M545 Low back pain, unspecified: Secondary | ICD-10-CM | POA: Diagnosis not present

## 2011-10-13 DIAGNOSIS — M25519 Pain in unspecified shoulder: Secondary | ICD-10-CM | POA: Diagnosis not present

## 2011-10-14 DIAGNOSIS — Z4789 Encounter for other orthopedic aftercare: Secondary | ICD-10-CM | POA: Diagnosis not present

## 2011-10-14 DIAGNOSIS — M25519 Pain in unspecified shoulder: Secondary | ICD-10-CM | POA: Diagnosis not present

## 2011-10-14 DIAGNOSIS — M75120 Complete rotator cuff tear or rupture of unspecified shoulder, not specified as traumatic: Secondary | ICD-10-CM | POA: Diagnosis not present

## 2011-10-14 DIAGNOSIS — IMO0001 Reserved for inherently not codable concepts without codable children: Secondary | ICD-10-CM | POA: Diagnosis not present

## 2011-10-14 DIAGNOSIS — M6281 Muscle weakness (generalized): Secondary | ICD-10-CM | POA: Diagnosis not present

## 2011-10-14 DIAGNOSIS — M25619 Stiffness of unspecified shoulder, not elsewhere classified: Secondary | ICD-10-CM | POA: Diagnosis not present

## 2011-10-17 DIAGNOSIS — IMO0001 Reserved for inherently not codable concepts without codable children: Secondary | ICD-10-CM | POA: Diagnosis not present

## 2011-10-17 DIAGNOSIS — Z4789 Encounter for other orthopedic aftercare: Secondary | ICD-10-CM | POA: Diagnosis not present

## 2011-10-17 DIAGNOSIS — M75120 Complete rotator cuff tear or rupture of unspecified shoulder, not specified as traumatic: Secondary | ICD-10-CM | POA: Diagnosis not present

## 2011-10-17 DIAGNOSIS — M6281 Muscle weakness (generalized): Secondary | ICD-10-CM | POA: Diagnosis not present

## 2011-10-17 DIAGNOSIS — M25519 Pain in unspecified shoulder: Secondary | ICD-10-CM | POA: Diagnosis not present

## 2011-10-17 DIAGNOSIS — M25619 Stiffness of unspecified shoulder, not elsewhere classified: Secondary | ICD-10-CM | POA: Diagnosis not present

## 2011-10-21 DIAGNOSIS — M75120 Complete rotator cuff tear or rupture of unspecified shoulder, not specified as traumatic: Secondary | ICD-10-CM | POA: Diagnosis not present

## 2011-10-21 DIAGNOSIS — M6281 Muscle weakness (generalized): Secondary | ICD-10-CM | POA: Diagnosis not present

## 2011-10-21 DIAGNOSIS — IMO0001 Reserved for inherently not codable concepts without codable children: Secondary | ICD-10-CM | POA: Diagnosis not present

## 2011-10-21 DIAGNOSIS — Z4789 Encounter for other orthopedic aftercare: Secondary | ICD-10-CM | POA: Diagnosis not present

## 2011-10-21 DIAGNOSIS — M25519 Pain in unspecified shoulder: Secondary | ICD-10-CM | POA: Diagnosis not present

## 2011-10-21 DIAGNOSIS — M25619 Stiffness of unspecified shoulder, not elsewhere classified: Secondary | ICD-10-CM | POA: Diagnosis not present

## 2011-10-23 DIAGNOSIS — M75120 Complete rotator cuff tear or rupture of unspecified shoulder, not specified as traumatic: Secondary | ICD-10-CM | POA: Diagnosis not present

## 2011-10-23 DIAGNOSIS — M25619 Stiffness of unspecified shoulder, not elsewhere classified: Secondary | ICD-10-CM | POA: Diagnosis not present

## 2011-10-23 DIAGNOSIS — M25519 Pain in unspecified shoulder: Secondary | ICD-10-CM | POA: Diagnosis not present

## 2011-10-23 DIAGNOSIS — IMO0001 Reserved for inherently not codable concepts without codable children: Secondary | ICD-10-CM | POA: Diagnosis not present

## 2011-10-23 DIAGNOSIS — M6281 Muscle weakness (generalized): Secondary | ICD-10-CM | POA: Diagnosis not present

## 2011-10-23 DIAGNOSIS — Z4789 Encounter for other orthopedic aftercare: Secondary | ICD-10-CM | POA: Diagnosis not present

## 2011-10-28 DIAGNOSIS — M25619 Stiffness of unspecified shoulder, not elsewhere classified: Secondary | ICD-10-CM | POA: Diagnosis not present

## 2011-10-28 DIAGNOSIS — M6281 Muscle weakness (generalized): Secondary | ICD-10-CM | POA: Diagnosis not present

## 2011-10-28 DIAGNOSIS — M75120 Complete rotator cuff tear or rupture of unspecified shoulder, not specified as traumatic: Secondary | ICD-10-CM | POA: Diagnosis not present

## 2011-10-28 DIAGNOSIS — IMO0001 Reserved for inherently not codable concepts without codable children: Secondary | ICD-10-CM | POA: Diagnosis not present

## 2011-10-28 DIAGNOSIS — M810 Age-related osteoporosis without current pathological fracture: Secondary | ICD-10-CM | POA: Diagnosis not present

## 2011-10-28 DIAGNOSIS — F411 Generalized anxiety disorder: Secondary | ICD-10-CM | POA: Diagnosis not present

## 2011-10-28 DIAGNOSIS — Z4789 Encounter for other orthopedic aftercare: Secondary | ICD-10-CM | POA: Diagnosis not present

## 2011-10-28 DIAGNOSIS — M25519 Pain in unspecified shoulder: Secondary | ICD-10-CM | POA: Diagnosis not present

## 2011-10-28 DIAGNOSIS — G4700 Insomnia, unspecified: Secondary | ICD-10-CM | POA: Diagnosis not present

## 2011-10-28 DIAGNOSIS — K219 Gastro-esophageal reflux disease without esophagitis: Secondary | ICD-10-CM | POA: Diagnosis not present

## 2011-11-06 DIAGNOSIS — M75120 Complete rotator cuff tear or rupture of unspecified shoulder, not specified as traumatic: Secondary | ICD-10-CM | POA: Diagnosis not present

## 2011-11-06 DIAGNOSIS — Z4789 Encounter for other orthopedic aftercare: Secondary | ICD-10-CM | POA: Diagnosis not present

## 2011-11-06 DIAGNOSIS — M25519 Pain in unspecified shoulder: Secondary | ICD-10-CM | POA: Diagnosis not present

## 2011-11-06 DIAGNOSIS — M6281 Muscle weakness (generalized): Secondary | ICD-10-CM | POA: Diagnosis not present

## 2011-11-06 DIAGNOSIS — M25619 Stiffness of unspecified shoulder, not elsewhere classified: Secondary | ICD-10-CM | POA: Diagnosis not present

## 2011-11-06 DIAGNOSIS — IMO0001 Reserved for inherently not codable concepts without codable children: Secondary | ICD-10-CM | POA: Diagnosis not present

## 2011-11-13 DIAGNOSIS — M25519 Pain in unspecified shoulder: Secondary | ICD-10-CM | POA: Diagnosis not present

## 2011-11-13 DIAGNOSIS — M6281 Muscle weakness (generalized): Secondary | ICD-10-CM | POA: Diagnosis not present

## 2011-11-13 DIAGNOSIS — Z4789 Encounter for other orthopedic aftercare: Secondary | ICD-10-CM | POA: Diagnosis not present

## 2011-11-13 DIAGNOSIS — M75120 Complete rotator cuff tear or rupture of unspecified shoulder, not specified as traumatic: Secondary | ICD-10-CM | POA: Diagnosis not present

## 2011-11-13 DIAGNOSIS — M25619 Stiffness of unspecified shoulder, not elsewhere classified: Secondary | ICD-10-CM | POA: Diagnosis not present

## 2011-11-13 DIAGNOSIS — IMO0001 Reserved for inherently not codable concepts without codable children: Secondary | ICD-10-CM | POA: Diagnosis not present

## 2011-11-18 DIAGNOSIS — M75120 Complete rotator cuff tear or rupture of unspecified shoulder, not specified as traumatic: Secondary | ICD-10-CM | POA: Diagnosis not present

## 2011-11-18 DIAGNOSIS — M6281 Muscle weakness (generalized): Secondary | ICD-10-CM | POA: Diagnosis not present

## 2011-11-18 DIAGNOSIS — M25519 Pain in unspecified shoulder: Secondary | ICD-10-CM | POA: Diagnosis not present

## 2011-11-18 DIAGNOSIS — IMO0001 Reserved for inherently not codable concepts without codable children: Secondary | ICD-10-CM | POA: Diagnosis not present

## 2011-11-18 DIAGNOSIS — Z4789 Encounter for other orthopedic aftercare: Secondary | ICD-10-CM | POA: Diagnosis not present

## 2011-11-18 DIAGNOSIS — M25619 Stiffness of unspecified shoulder, not elsewhere classified: Secondary | ICD-10-CM | POA: Diagnosis not present

## 2011-11-21 DIAGNOSIS — M25619 Stiffness of unspecified shoulder, not elsewhere classified: Secondary | ICD-10-CM | POA: Diagnosis not present

## 2011-11-21 DIAGNOSIS — M75120 Complete rotator cuff tear or rupture of unspecified shoulder, not specified as traumatic: Secondary | ICD-10-CM | POA: Diagnosis not present

## 2011-11-21 DIAGNOSIS — M25519 Pain in unspecified shoulder: Secondary | ICD-10-CM | POA: Diagnosis not present

## 2011-11-21 DIAGNOSIS — M6281 Muscle weakness (generalized): Secondary | ICD-10-CM | POA: Diagnosis not present

## 2011-11-21 DIAGNOSIS — IMO0001 Reserved for inherently not codable concepts without codable children: Secondary | ICD-10-CM | POA: Diagnosis not present

## 2011-11-21 DIAGNOSIS — Z4789 Encounter for other orthopedic aftercare: Secondary | ICD-10-CM | POA: Diagnosis not present

## 2011-11-25 DIAGNOSIS — M75120 Complete rotator cuff tear or rupture of unspecified shoulder, not specified as traumatic: Secondary | ICD-10-CM | POA: Diagnosis not present

## 2011-11-25 DIAGNOSIS — Z4789 Encounter for other orthopedic aftercare: Secondary | ICD-10-CM | POA: Diagnosis not present

## 2011-11-25 DIAGNOSIS — M6281 Muscle weakness (generalized): Secondary | ICD-10-CM | POA: Diagnosis not present

## 2011-11-25 DIAGNOSIS — M25619 Stiffness of unspecified shoulder, not elsewhere classified: Secondary | ICD-10-CM | POA: Diagnosis not present

## 2011-11-25 DIAGNOSIS — M25519 Pain in unspecified shoulder: Secondary | ICD-10-CM | POA: Diagnosis not present

## 2011-11-25 DIAGNOSIS — IMO0001 Reserved for inherently not codable concepts without codable children: Secondary | ICD-10-CM | POA: Diagnosis not present

## 2011-11-26 DIAGNOSIS — M810 Age-related osteoporosis without current pathological fracture: Secondary | ICD-10-CM | POA: Diagnosis not present

## 2011-11-26 DIAGNOSIS — G4700 Insomnia, unspecified: Secondary | ICD-10-CM | POA: Diagnosis not present

## 2011-11-26 DIAGNOSIS — F411 Generalized anxiety disorder: Secondary | ICD-10-CM | POA: Diagnosis not present

## 2011-11-26 DIAGNOSIS — K219 Gastro-esophageal reflux disease without esophagitis: Secondary | ICD-10-CM | POA: Diagnosis not present

## 2011-11-27 DIAGNOSIS — M6281 Muscle weakness (generalized): Secondary | ICD-10-CM | POA: Diagnosis not present

## 2011-11-27 DIAGNOSIS — M25519 Pain in unspecified shoulder: Secondary | ICD-10-CM | POA: Diagnosis not present

## 2011-11-27 DIAGNOSIS — IMO0001 Reserved for inherently not codable concepts without codable children: Secondary | ICD-10-CM | POA: Diagnosis not present

## 2011-11-27 DIAGNOSIS — M75120 Complete rotator cuff tear or rupture of unspecified shoulder, not specified as traumatic: Secondary | ICD-10-CM | POA: Diagnosis not present

## 2011-11-27 DIAGNOSIS — Z4789 Encounter for other orthopedic aftercare: Secondary | ICD-10-CM | POA: Diagnosis not present

## 2011-11-27 DIAGNOSIS — M25619 Stiffness of unspecified shoulder, not elsewhere classified: Secondary | ICD-10-CM | POA: Diagnosis not present

## 2011-12-02 DIAGNOSIS — M6281 Muscle weakness (generalized): Secondary | ICD-10-CM | POA: Diagnosis not present

## 2011-12-02 DIAGNOSIS — IMO0001 Reserved for inherently not codable concepts without codable children: Secondary | ICD-10-CM | POA: Diagnosis not present

## 2011-12-02 DIAGNOSIS — M75120 Complete rotator cuff tear or rupture of unspecified shoulder, not specified as traumatic: Secondary | ICD-10-CM | POA: Diagnosis not present

## 2011-12-02 DIAGNOSIS — Z4789 Encounter for other orthopedic aftercare: Secondary | ICD-10-CM | POA: Diagnosis not present

## 2011-12-02 DIAGNOSIS — M25619 Stiffness of unspecified shoulder, not elsewhere classified: Secondary | ICD-10-CM | POA: Diagnosis not present

## 2011-12-02 DIAGNOSIS — M25519 Pain in unspecified shoulder: Secondary | ICD-10-CM | POA: Diagnosis not present

## 2011-12-04 DIAGNOSIS — M6281 Muscle weakness (generalized): Secondary | ICD-10-CM | POA: Diagnosis not present

## 2011-12-04 DIAGNOSIS — M25619 Stiffness of unspecified shoulder, not elsewhere classified: Secondary | ICD-10-CM | POA: Diagnosis not present

## 2011-12-04 DIAGNOSIS — M75120 Complete rotator cuff tear or rupture of unspecified shoulder, not specified as traumatic: Secondary | ICD-10-CM | POA: Diagnosis not present

## 2011-12-04 DIAGNOSIS — M25519 Pain in unspecified shoulder: Secondary | ICD-10-CM | POA: Diagnosis not present

## 2011-12-04 DIAGNOSIS — IMO0001 Reserved for inherently not codable concepts without codable children: Secondary | ICD-10-CM | POA: Diagnosis not present

## 2011-12-04 DIAGNOSIS — Z4789 Encounter for other orthopedic aftercare: Secondary | ICD-10-CM | POA: Diagnosis not present

## 2011-12-09 DIAGNOSIS — M6281 Muscle weakness (generalized): Secondary | ICD-10-CM | POA: Diagnosis not present

## 2011-12-09 DIAGNOSIS — Z4789 Encounter for other orthopedic aftercare: Secondary | ICD-10-CM | POA: Diagnosis not present

## 2011-12-09 DIAGNOSIS — M75120 Complete rotator cuff tear or rupture of unspecified shoulder, not specified as traumatic: Secondary | ICD-10-CM | POA: Diagnosis not present

## 2011-12-09 DIAGNOSIS — M25519 Pain in unspecified shoulder: Secondary | ICD-10-CM | POA: Diagnosis not present

## 2011-12-09 DIAGNOSIS — M25619 Stiffness of unspecified shoulder, not elsewhere classified: Secondary | ICD-10-CM | POA: Diagnosis not present

## 2011-12-09 DIAGNOSIS — IMO0001 Reserved for inherently not codable concepts without codable children: Secondary | ICD-10-CM | POA: Diagnosis not present

## 2011-12-11 DIAGNOSIS — IMO0001 Reserved for inherently not codable concepts without codable children: Secondary | ICD-10-CM | POA: Diagnosis not present

## 2011-12-11 DIAGNOSIS — M6281 Muscle weakness (generalized): Secondary | ICD-10-CM | POA: Diagnosis not present

## 2011-12-11 DIAGNOSIS — M75120 Complete rotator cuff tear or rupture of unspecified shoulder, not specified as traumatic: Secondary | ICD-10-CM | POA: Diagnosis not present

## 2011-12-11 DIAGNOSIS — M25619 Stiffness of unspecified shoulder, not elsewhere classified: Secondary | ICD-10-CM | POA: Diagnosis not present

## 2011-12-11 DIAGNOSIS — Z4789 Encounter for other orthopedic aftercare: Secondary | ICD-10-CM | POA: Diagnosis not present

## 2011-12-11 DIAGNOSIS — M25519 Pain in unspecified shoulder: Secondary | ICD-10-CM | POA: Diagnosis not present

## 2011-12-16 DIAGNOSIS — M25519 Pain in unspecified shoulder: Secondary | ICD-10-CM | POA: Diagnosis not present

## 2011-12-16 DIAGNOSIS — Z4789 Encounter for other orthopedic aftercare: Secondary | ICD-10-CM | POA: Diagnosis not present

## 2011-12-16 DIAGNOSIS — M6281 Muscle weakness (generalized): Secondary | ICD-10-CM | POA: Diagnosis not present

## 2011-12-16 DIAGNOSIS — M25619 Stiffness of unspecified shoulder, not elsewhere classified: Secondary | ICD-10-CM | POA: Diagnosis not present

## 2011-12-16 DIAGNOSIS — IMO0001 Reserved for inherently not codable concepts without codable children: Secondary | ICD-10-CM | POA: Diagnosis not present

## 2011-12-16 DIAGNOSIS — M75120 Complete rotator cuff tear or rupture of unspecified shoulder, not specified as traumatic: Secondary | ICD-10-CM | POA: Diagnosis not present

## 2011-12-18 DIAGNOSIS — IMO0001 Reserved for inherently not codable concepts without codable children: Secondary | ICD-10-CM | POA: Diagnosis not present

## 2011-12-18 DIAGNOSIS — Z4789 Encounter for other orthopedic aftercare: Secondary | ICD-10-CM | POA: Diagnosis not present

## 2011-12-18 DIAGNOSIS — M6281 Muscle weakness (generalized): Secondary | ICD-10-CM | POA: Diagnosis not present

## 2011-12-18 DIAGNOSIS — M75120 Complete rotator cuff tear or rupture of unspecified shoulder, not specified as traumatic: Secondary | ICD-10-CM | POA: Diagnosis not present

## 2011-12-18 DIAGNOSIS — M25519 Pain in unspecified shoulder: Secondary | ICD-10-CM | POA: Diagnosis not present

## 2011-12-18 DIAGNOSIS — M25619 Stiffness of unspecified shoulder, not elsewhere classified: Secondary | ICD-10-CM | POA: Diagnosis not present

## 2011-12-24 DIAGNOSIS — M25619 Stiffness of unspecified shoulder, not elsewhere classified: Secondary | ICD-10-CM | POA: Diagnosis not present

## 2011-12-24 DIAGNOSIS — IMO0001 Reserved for inherently not codable concepts without codable children: Secondary | ICD-10-CM | POA: Diagnosis not present

## 2011-12-24 DIAGNOSIS — K219 Gastro-esophageal reflux disease without esophagitis: Secondary | ICD-10-CM | POA: Diagnosis not present

## 2011-12-24 DIAGNOSIS — M75120 Complete rotator cuff tear or rupture of unspecified shoulder, not specified as traumatic: Secondary | ICD-10-CM | POA: Diagnosis not present

## 2011-12-24 DIAGNOSIS — Z4789 Encounter for other orthopedic aftercare: Secondary | ICD-10-CM | POA: Diagnosis not present

## 2011-12-24 DIAGNOSIS — M810 Age-related osteoporosis without current pathological fracture: Secondary | ICD-10-CM | POA: Diagnosis not present

## 2011-12-24 DIAGNOSIS — M6281 Muscle weakness (generalized): Secondary | ICD-10-CM | POA: Diagnosis not present

## 2011-12-24 DIAGNOSIS — IMO0002 Reserved for concepts with insufficient information to code with codable children: Secondary | ICD-10-CM | POA: Diagnosis not present

## 2011-12-24 DIAGNOSIS — G4700 Insomnia, unspecified: Secondary | ICD-10-CM | POA: Diagnosis not present

## 2011-12-24 DIAGNOSIS — M25519 Pain in unspecified shoulder: Secondary | ICD-10-CM | POA: Diagnosis not present

## 2011-12-24 DIAGNOSIS — Z23 Encounter for immunization: Secondary | ICD-10-CM | POA: Diagnosis not present

## 2011-12-31 DIAGNOSIS — M6281 Muscle weakness (generalized): Secondary | ICD-10-CM | POA: Diagnosis not present

## 2011-12-31 DIAGNOSIS — M25519 Pain in unspecified shoulder: Secondary | ICD-10-CM | POA: Diagnosis not present

## 2011-12-31 DIAGNOSIS — M25619 Stiffness of unspecified shoulder, not elsewhere classified: Secondary | ICD-10-CM | POA: Diagnosis not present

## 2011-12-31 DIAGNOSIS — M75120 Complete rotator cuff tear or rupture of unspecified shoulder, not specified as traumatic: Secondary | ICD-10-CM | POA: Diagnosis not present

## 2011-12-31 DIAGNOSIS — IMO0001 Reserved for inherently not codable concepts without codable children: Secondary | ICD-10-CM | POA: Diagnosis not present

## 2011-12-31 DIAGNOSIS — Z4789 Encounter for other orthopedic aftercare: Secondary | ICD-10-CM | POA: Diagnosis not present

## 2012-01-01 DIAGNOSIS — M25519 Pain in unspecified shoulder: Secondary | ICD-10-CM | POA: Diagnosis not present

## 2012-01-05 DIAGNOSIS — M961 Postlaminectomy syndrome, not elsewhere classified: Secondary | ICD-10-CM | POA: Diagnosis not present

## 2012-01-05 DIAGNOSIS — M5137 Other intervertebral disc degeneration, lumbosacral region: Secondary | ICD-10-CM | POA: Diagnosis not present

## 2012-01-05 DIAGNOSIS — M545 Low back pain, unspecified: Secondary | ICD-10-CM | POA: Diagnosis not present

## 2012-01-05 DIAGNOSIS — M25519 Pain in unspecified shoulder: Secondary | ICD-10-CM | POA: Diagnosis not present

## 2012-01-07 DIAGNOSIS — M6281 Muscle weakness (generalized): Secondary | ICD-10-CM | POA: Diagnosis not present

## 2012-01-07 DIAGNOSIS — IMO0001 Reserved for inherently not codable concepts without codable children: Secondary | ICD-10-CM | POA: Diagnosis not present

## 2012-01-07 DIAGNOSIS — M25619 Stiffness of unspecified shoulder, not elsewhere classified: Secondary | ICD-10-CM | POA: Diagnosis not present

## 2012-01-07 DIAGNOSIS — M25519 Pain in unspecified shoulder: Secondary | ICD-10-CM | POA: Diagnosis not present

## 2012-01-07 DIAGNOSIS — M75120 Complete rotator cuff tear or rupture of unspecified shoulder, not specified as traumatic: Secondary | ICD-10-CM | POA: Diagnosis not present

## 2012-01-07 DIAGNOSIS — Z4789 Encounter for other orthopedic aftercare: Secondary | ICD-10-CM | POA: Diagnosis not present

## 2012-01-14 DIAGNOSIS — IMO0001 Reserved for inherently not codable concepts without codable children: Secondary | ICD-10-CM | POA: Diagnosis not present

## 2012-01-14 DIAGNOSIS — M75120 Complete rotator cuff tear or rupture of unspecified shoulder, not specified as traumatic: Secondary | ICD-10-CM | POA: Diagnosis not present

## 2012-01-14 DIAGNOSIS — M25619 Stiffness of unspecified shoulder, not elsewhere classified: Secondary | ICD-10-CM | POA: Diagnosis not present

## 2012-01-14 DIAGNOSIS — M6281 Muscle weakness (generalized): Secondary | ICD-10-CM | POA: Diagnosis not present

## 2012-01-14 DIAGNOSIS — Z4789 Encounter for other orthopedic aftercare: Secondary | ICD-10-CM | POA: Diagnosis not present

## 2012-01-14 DIAGNOSIS — M25519 Pain in unspecified shoulder: Secondary | ICD-10-CM | POA: Diagnosis not present

## 2012-01-21 DIAGNOSIS — K219 Gastro-esophageal reflux disease without esophagitis: Secondary | ICD-10-CM | POA: Diagnosis not present

## 2012-01-21 DIAGNOSIS — R5381 Other malaise: Secondary | ICD-10-CM | POA: Diagnosis not present

## 2012-01-21 DIAGNOSIS — G238 Other specified degenerative diseases of basal ganglia: Secondary | ICD-10-CM | POA: Diagnosis not present

## 2012-01-21 DIAGNOSIS — IMO0002 Reserved for concepts with insufficient information to code with codable children: Secondary | ICD-10-CM | POA: Diagnosis not present

## 2012-01-21 DIAGNOSIS — G4700 Insomnia, unspecified: Secondary | ICD-10-CM | POA: Diagnosis not present

## 2012-01-21 DIAGNOSIS — R5383 Other fatigue: Secondary | ICD-10-CM | POA: Diagnosis not present

## 2012-02-06 DIAGNOSIS — M949 Disorder of cartilage, unspecified: Secondary | ICD-10-CM | POA: Diagnosis not present

## 2012-02-06 DIAGNOSIS — M899 Disorder of bone, unspecified: Secondary | ICD-10-CM | POA: Diagnosis not present

## 2012-02-12 DIAGNOSIS — S43439A Superior glenoid labrum lesion of unspecified shoulder, initial encounter: Secondary | ICD-10-CM | POA: Diagnosis not present

## 2012-02-18 DIAGNOSIS — G4700 Insomnia, unspecified: Secondary | ICD-10-CM | POA: Diagnosis not present

## 2012-02-18 DIAGNOSIS — IMO0002 Reserved for concepts with insufficient information to code with codable children: Secondary | ICD-10-CM | POA: Diagnosis not present

## 2012-02-18 DIAGNOSIS — M810 Age-related osteoporosis without current pathological fracture: Secondary | ICD-10-CM | POA: Diagnosis not present

## 2012-02-18 DIAGNOSIS — K219 Gastro-esophageal reflux disease without esophagitis: Secondary | ICD-10-CM | POA: Diagnosis not present

## 2012-02-26 DIAGNOSIS — R1310 Dysphagia, unspecified: Secondary | ICD-10-CM | POA: Diagnosis not present

## 2012-03-04 DIAGNOSIS — D126 Benign neoplasm of colon, unspecified: Secondary | ICD-10-CM | POA: Diagnosis not present

## 2012-03-04 DIAGNOSIS — Z800 Family history of malignant neoplasm of digestive organs: Secondary | ICD-10-CM | POA: Diagnosis not present

## 2012-03-04 DIAGNOSIS — Z86010 Personal history of colonic polyps: Secondary | ICD-10-CM | POA: Diagnosis not present

## 2012-03-04 DIAGNOSIS — D128 Benign neoplasm of rectum: Secondary | ICD-10-CM | POA: Diagnosis not present

## 2012-03-04 DIAGNOSIS — F329 Major depressive disorder, single episode, unspecified: Secondary | ICD-10-CM | POA: Diagnosis not present

## 2012-03-04 DIAGNOSIS — Z79899 Other long term (current) drug therapy: Secondary | ICD-10-CM | POA: Diagnosis not present

## 2012-03-04 DIAGNOSIS — E079 Disorder of thyroid, unspecified: Secondary | ICD-10-CM | POA: Diagnosis not present

## 2012-03-15 DIAGNOSIS — IMO0002 Reserved for concepts with insufficient information to code with codable children: Secondary | ICD-10-CM | POA: Diagnosis not present

## 2012-03-15 DIAGNOSIS — M159 Polyosteoarthritis, unspecified: Secondary | ICD-10-CM | POA: Diagnosis not present

## 2012-03-15 DIAGNOSIS — D72829 Elevated white blood cell count, unspecified: Secondary | ICD-10-CM | POA: Diagnosis not present

## 2012-03-15 DIAGNOSIS — R946 Abnormal results of thyroid function studies: Secondary | ICD-10-CM | POA: Diagnosis not present

## 2012-03-30 DIAGNOSIS — M961 Postlaminectomy syndrome, not elsewhere classified: Secondary | ICD-10-CM | POA: Diagnosis not present

## 2012-03-30 DIAGNOSIS — G894 Chronic pain syndrome: Secondary | ICD-10-CM | POA: Diagnosis not present

## 2012-03-30 DIAGNOSIS — M5137 Other intervertebral disc degeneration, lumbosacral region: Secondary | ICD-10-CM | POA: Diagnosis not present

## 2012-03-30 DIAGNOSIS — M51379 Other intervertebral disc degeneration, lumbosacral region without mention of lumbar back pain or lower extremity pain: Secondary | ICD-10-CM | POA: Diagnosis not present

## 2012-03-30 DIAGNOSIS — M25519 Pain in unspecified shoulder: Secondary | ICD-10-CM | POA: Diagnosis not present

## 2012-03-30 DIAGNOSIS — M25569 Pain in unspecified knee: Secondary | ICD-10-CM | POA: Diagnosis not present

## 2012-04-12 DIAGNOSIS — R252 Cramp and spasm: Secondary | ICD-10-CM | POA: Diagnosis not present

## 2012-04-12 DIAGNOSIS — G4700 Insomnia, unspecified: Secondary | ICD-10-CM | POA: Diagnosis not present

## 2012-04-12 DIAGNOSIS — M159 Polyosteoarthritis, unspecified: Secondary | ICD-10-CM | POA: Diagnosis not present

## 2012-04-12 DIAGNOSIS — IMO0002 Reserved for concepts with insufficient information to code with codable children: Secondary | ICD-10-CM | POA: Diagnosis not present

## 2012-04-19 DIAGNOSIS — M25569 Pain in unspecified knee: Secondary | ICD-10-CM | POA: Diagnosis not present

## 2012-04-24 HISTORY — PX: KNEE SURGERY: SHX244

## 2012-04-26 DIAGNOSIS — M23329 Other meniscus derangements, posterior horn of medial meniscus, unspecified knee: Secondary | ICD-10-CM | POA: Diagnosis not present

## 2012-04-26 DIAGNOSIS — M25569 Pain in unspecified knee: Secondary | ICD-10-CM | POA: Diagnosis not present

## 2012-04-28 DIAGNOSIS — S83289A Other tear of lateral meniscus, current injury, unspecified knee, initial encounter: Secondary | ICD-10-CM | POA: Diagnosis not present

## 2012-04-28 DIAGNOSIS — IMO0002 Reserved for concepts with insufficient information to code with codable children: Secondary | ICD-10-CM | POA: Diagnosis not present

## 2012-05-10 DIAGNOSIS — G4700 Insomnia, unspecified: Secondary | ICD-10-CM | POA: Diagnosis not present

## 2012-05-10 DIAGNOSIS — M810 Age-related osteoporosis without current pathological fracture: Secondary | ICD-10-CM | POA: Diagnosis not present

## 2012-05-10 DIAGNOSIS — K219 Gastro-esophageal reflux disease without esophagitis: Secondary | ICD-10-CM | POA: Diagnosis not present

## 2012-05-10 DIAGNOSIS — IMO0002 Reserved for concepts with insufficient information to code with codable children: Secondary | ICD-10-CM | POA: Diagnosis not present

## 2012-05-11 DIAGNOSIS — M2340 Loose body in knee, unspecified knee: Secondary | ICD-10-CM | POA: Diagnosis not present

## 2012-05-11 DIAGNOSIS — IMO0002 Reserved for concepts with insufficient information to code with codable children: Secondary | ICD-10-CM | POA: Diagnosis not present

## 2012-05-11 DIAGNOSIS — M659 Synovitis and tenosynovitis, unspecified: Secondary | ICD-10-CM | POA: Diagnosis not present

## 2012-05-11 DIAGNOSIS — M23359 Other meniscus derangements, posterior horn of lateral meniscus, unspecified knee: Secondary | ICD-10-CM | POA: Diagnosis not present

## 2012-05-11 DIAGNOSIS — S83289A Other tear of lateral meniscus, current injury, unspecified knee, initial encounter: Secondary | ICD-10-CM | POA: Diagnosis not present

## 2012-05-11 DIAGNOSIS — M23329 Other meniscus derangements, posterior horn of medial meniscus, unspecified knee: Secondary | ICD-10-CM | POA: Diagnosis not present

## 2012-05-11 DIAGNOSIS — M2240 Chondromalacia patellae, unspecified knee: Secondary | ICD-10-CM | POA: Diagnosis not present

## 2012-05-11 DIAGNOSIS — M6750 Plica syndrome, unspecified knee: Secondary | ICD-10-CM | POA: Diagnosis not present

## 2012-05-11 DIAGNOSIS — M1710 Unilateral primary osteoarthritis, unspecified knee: Secondary | ICD-10-CM | POA: Diagnosis not present

## 2012-05-14 DIAGNOSIS — M25569 Pain in unspecified knee: Secondary | ICD-10-CM | POA: Diagnosis not present

## 2012-05-19 DIAGNOSIS — M25569 Pain in unspecified knee: Secondary | ICD-10-CM | POA: Diagnosis not present

## 2012-05-21 DIAGNOSIS — M25569 Pain in unspecified knee: Secondary | ICD-10-CM | POA: Diagnosis not present

## 2012-05-21 DIAGNOSIS — M1710 Unilateral primary osteoarthritis, unspecified knee: Secondary | ICD-10-CM | POA: Diagnosis not present

## 2012-05-24 DIAGNOSIS — M1710 Unilateral primary osteoarthritis, unspecified knee: Secondary | ICD-10-CM | POA: Diagnosis not present

## 2012-05-24 DIAGNOSIS — M25569 Pain in unspecified knee: Secondary | ICD-10-CM | POA: Diagnosis not present

## 2012-05-27 DIAGNOSIS — M1710 Unilateral primary osteoarthritis, unspecified knee: Secondary | ICD-10-CM | POA: Diagnosis not present

## 2012-05-27 DIAGNOSIS — M25569 Pain in unspecified knee: Secondary | ICD-10-CM | POA: Diagnosis not present

## 2012-06-01 DIAGNOSIS — IMO0001 Reserved for inherently not codable concepts without codable children: Secondary | ICD-10-CM | POA: Diagnosis not present

## 2012-06-01 DIAGNOSIS — M25569 Pain in unspecified knee: Secondary | ICD-10-CM | POA: Diagnosis not present

## 2012-06-01 DIAGNOSIS — M1710 Unilateral primary osteoarthritis, unspecified knee: Secondary | ICD-10-CM | POA: Diagnosis not present

## 2012-06-04 DIAGNOSIS — IMO0001 Reserved for inherently not codable concepts without codable children: Secondary | ICD-10-CM | POA: Diagnosis not present

## 2012-06-04 DIAGNOSIS — M25569 Pain in unspecified knee: Secondary | ICD-10-CM | POA: Diagnosis not present

## 2012-06-04 DIAGNOSIS — M1710 Unilateral primary osteoarthritis, unspecified knee: Secondary | ICD-10-CM | POA: Diagnosis not present

## 2012-06-07 DIAGNOSIS — M25569 Pain in unspecified knee: Secondary | ICD-10-CM | POA: Diagnosis not present

## 2012-06-07 DIAGNOSIS — G4700 Insomnia, unspecified: Secondary | ICD-10-CM | POA: Diagnosis not present

## 2012-06-07 DIAGNOSIS — M1710 Unilateral primary osteoarthritis, unspecified knee: Secondary | ICD-10-CM | POA: Diagnosis not present

## 2012-06-07 DIAGNOSIS — M810 Age-related osteoporosis without current pathological fracture: Secondary | ICD-10-CM | POA: Diagnosis not present

## 2012-06-07 DIAGNOSIS — IMO0002 Reserved for concepts with insufficient information to code with codable children: Secondary | ICD-10-CM | POA: Diagnosis not present

## 2012-06-07 DIAGNOSIS — K219 Gastro-esophageal reflux disease without esophagitis: Secondary | ICD-10-CM | POA: Diagnosis not present

## 2012-06-07 DIAGNOSIS — IMO0001 Reserved for inherently not codable concepts without codable children: Secondary | ICD-10-CM | POA: Diagnosis not present

## 2012-06-11 DIAGNOSIS — Z1231 Encounter for screening mammogram for malignant neoplasm of breast: Secondary | ICD-10-CM | POA: Diagnosis not present

## 2012-06-17 DIAGNOSIS — K219 Gastro-esophageal reflux disease without esophagitis: Secondary | ICD-10-CM | POA: Diagnosis not present

## 2012-06-17 DIAGNOSIS — M25559 Pain in unspecified hip: Secondary | ICD-10-CM | POA: Diagnosis not present

## 2012-06-17 DIAGNOSIS — IMO0002 Reserved for concepts with insufficient information to code with codable children: Secondary | ICD-10-CM | POA: Diagnosis not present

## 2012-06-17 DIAGNOSIS — M159 Polyosteoarthritis, unspecified: Secondary | ICD-10-CM | POA: Diagnosis not present

## 2012-06-21 DIAGNOSIS — G894 Chronic pain syndrome: Secondary | ICD-10-CM | POA: Diagnosis not present

## 2012-06-21 DIAGNOSIS — M5137 Other intervertebral disc degeneration, lumbosacral region: Secondary | ICD-10-CM | POA: Diagnosis not present

## 2012-06-21 DIAGNOSIS — M961 Postlaminectomy syndrome, not elsewhere classified: Secondary | ICD-10-CM | POA: Diagnosis not present

## 2012-07-05 DIAGNOSIS — M5137 Other intervertebral disc degeneration, lumbosacral region: Secondary | ICD-10-CM | POA: Diagnosis not present

## 2012-07-05 DIAGNOSIS — M159 Polyosteoarthritis, unspecified: Secondary | ICD-10-CM | POA: Diagnosis not present

## 2012-07-05 DIAGNOSIS — M25569 Pain in unspecified knee: Secondary | ICD-10-CM | POA: Diagnosis not present

## 2012-07-05 DIAGNOSIS — Z9889 Other specified postprocedural states: Secondary | ICD-10-CM | POA: Diagnosis not present

## 2012-07-05 DIAGNOSIS — M1710 Unilateral primary osteoarthritis, unspecified knee: Secondary | ICD-10-CM | POA: Diagnosis not present

## 2012-07-05 DIAGNOSIS — G8929 Other chronic pain: Secondary | ICD-10-CM | POA: Diagnosis not present

## 2012-07-05 DIAGNOSIS — IMO0002 Reserved for concepts with insufficient information to code with codable children: Secondary | ICD-10-CM | POA: Diagnosis not present

## 2012-07-05 DIAGNOSIS — R609 Edema, unspecified: Secondary | ICD-10-CM | POA: Diagnosis not present

## 2012-07-05 DIAGNOSIS — M810 Age-related osteoporosis without current pathological fracture: Secondary | ICD-10-CM | POA: Diagnosis not present

## 2012-07-28 DIAGNOSIS — E041 Nontoxic single thyroid nodule: Secondary | ICD-10-CM | POA: Diagnosis not present

## 2012-07-28 DIAGNOSIS — Z9889 Other specified postprocedural states: Secondary | ICD-10-CM | POA: Diagnosis not present

## 2012-08-02 DIAGNOSIS — K219 Gastro-esophageal reflux disease without esophagitis: Secondary | ICD-10-CM | POA: Diagnosis not present

## 2012-08-02 DIAGNOSIS — Z79899 Other long term (current) drug therapy: Secondary | ICD-10-CM | POA: Diagnosis not present

## 2012-08-02 DIAGNOSIS — M159 Polyosteoarthritis, unspecified: Secondary | ICD-10-CM | POA: Diagnosis not present

## 2012-08-02 DIAGNOSIS — IMO0002 Reserved for concepts with insufficient information to code with codable children: Secondary | ICD-10-CM | POA: Diagnosis not present

## 2012-08-02 DIAGNOSIS — M810 Age-related osteoporosis without current pathological fracture: Secondary | ICD-10-CM | POA: Diagnosis not present

## 2012-08-05 DIAGNOSIS — E041 Nontoxic single thyroid nodule: Secondary | ICD-10-CM | POA: Diagnosis not present

## 2012-08-05 DIAGNOSIS — IMO0001 Reserved for inherently not codable concepts without codable children: Secondary | ICD-10-CM | POA: Diagnosis not present

## 2012-08-05 DIAGNOSIS — G4700 Insomnia, unspecified: Secondary | ICD-10-CM | POA: Diagnosis not present

## 2012-08-05 DIAGNOSIS — E049 Nontoxic goiter, unspecified: Secondary | ICD-10-CM | POA: Diagnosis not present

## 2012-08-05 DIAGNOSIS — K219 Gastro-esophageal reflux disease without esophagitis: Secondary | ICD-10-CM | POA: Diagnosis not present

## 2012-08-26 DIAGNOSIS — M545 Low back pain, unspecified: Secondary | ICD-10-CM | POA: Diagnosis not present

## 2012-08-31 DIAGNOSIS — M810 Age-related osteoporosis without current pathological fracture: Secondary | ICD-10-CM | POA: Diagnosis not present

## 2012-08-31 DIAGNOSIS — K219 Gastro-esophageal reflux disease without esophagitis: Secondary | ICD-10-CM | POA: Diagnosis not present

## 2012-08-31 DIAGNOSIS — R252 Cramp and spasm: Secondary | ICD-10-CM | POA: Diagnosis not present

## 2012-08-31 DIAGNOSIS — M159 Polyosteoarthritis, unspecified: Secondary | ICD-10-CM | POA: Diagnosis not present

## 2012-09-20 DIAGNOSIS — M545 Low back pain, unspecified: Secondary | ICD-10-CM | POA: Diagnosis not present

## 2012-09-22 ENCOUNTER — Other Ambulatory Visit: Payer: Self-pay | Admitting: Orthopedic Surgery

## 2012-09-22 DIAGNOSIS — M549 Dorsalgia, unspecified: Secondary | ICD-10-CM

## 2012-09-28 DIAGNOSIS — M810 Age-related osteoporosis without current pathological fracture: Secondary | ICD-10-CM | POA: Diagnosis not present

## 2012-09-28 DIAGNOSIS — M159 Polyosteoarthritis, unspecified: Secondary | ICD-10-CM | POA: Diagnosis not present

## 2012-09-28 DIAGNOSIS — K219 Gastro-esophageal reflux disease without esophagitis: Secondary | ICD-10-CM | POA: Diagnosis not present

## 2012-09-28 DIAGNOSIS — IMO0002 Reserved for concepts with insufficient information to code with codable children: Secondary | ICD-10-CM | POA: Diagnosis not present

## 2012-09-30 ENCOUNTER — Ambulatory Visit
Admission: RE | Admit: 2012-09-30 | Discharge: 2012-09-30 | Disposition: A | Discharge status: Home / Self Care | Payer: Medicare Other | Source: Ambulatory Visit | Attending: Orthopedic Surgery | Admitting: Orthopedic Surgery

## 2012-09-30 VITALS — BP 91/46 | HR 63 | Ht 65.0 in | Wt 143.0 lb

## 2012-09-30 DIAGNOSIS — M549 Dorsalgia, unspecified: Secondary | ICD-10-CM

## 2012-09-30 DIAGNOSIS — M545 Low back pain, unspecified: Secondary | ICD-10-CM | POA: Diagnosis not present

## 2012-09-30 DIAGNOSIS — M5126 Other intervertebral disc displacement, lumbar region: Secondary | ICD-10-CM | POA: Diagnosis not present

## 2012-09-30 MED ORDER — IOHEXOL 180 MG/ML  SOLN
15.0000 mL | Freq: Once | INTRAMUSCULAR | Status: AC | PRN
  Administered 2012-09-30: 15 mL via INTRATHECAL

## 2012-09-30 MED ORDER — DIAZEPAM 5 MG PO TABS
10.0000 mg | ORAL_TABLET | Freq: Once | ORAL | Status: AC
  Administered 2012-09-30: 10 mg via ORAL

## 2012-09-30 MED ORDER — ONDANSETRON HCL 4 MG/2ML IJ SOLN
4.0000 mg | Freq: Once | INTRAMUSCULAR | Status: AC
  Administered 2012-09-30: 4 mg via INTRAMUSCULAR

## 2012-09-30 MED ORDER — MEPERIDINE HCL 100 MG/ML IJ SOLN
75.0000 mg | Freq: Once | INTRAMUSCULAR | Status: AC
  Administered 2012-09-30: 75 mg via INTRAMUSCULAR

## 2012-09-30 NOTE — Progress Notes (Signed)
Pt states she has been off citalopram for the past 2 days. Discharge instructions explained to pt.

## 2012-10-05 DIAGNOSIS — IMO0002 Reserved for concepts with insufficient information to code with codable children: Secondary | ICD-10-CM | POA: Diagnosis not present

## 2012-10-05 DIAGNOSIS — M159 Polyosteoarthritis, unspecified: Secondary | ICD-10-CM | POA: Diagnosis not present

## 2012-10-05 DIAGNOSIS — R609 Edema, unspecified: Secondary | ICD-10-CM | POA: Diagnosis not present

## 2012-10-05 DIAGNOSIS — M810 Age-related osteoporosis without current pathological fracture: Secondary | ICD-10-CM | POA: Diagnosis not present

## 2012-10-13 DIAGNOSIS — M549 Dorsalgia, unspecified: Secondary | ICD-10-CM | POA: Diagnosis not present

## 2012-10-13 DIAGNOSIS — Z981 Arthrodesis status: Secondary | ICD-10-CM | POA: Diagnosis not present

## 2012-10-13 DIAGNOSIS — M961 Postlaminectomy syndrome, not elsewhere classified: Secondary | ICD-10-CM | POA: Diagnosis not present

## 2012-10-23 DIAGNOSIS — Z01810 Encounter for preprocedural cardiovascular examination: Secondary | ICD-10-CM | POA: Diagnosis not present

## 2012-10-23 DIAGNOSIS — K219 Gastro-esophageal reflux disease without esophagitis: Secondary | ICD-10-CM | POA: Diagnosis not present

## 2012-10-23 DIAGNOSIS — M159 Polyosteoarthritis, unspecified: Secondary | ICD-10-CM | POA: Diagnosis not present

## 2012-10-23 DIAGNOSIS — M810 Age-related osteoporosis without current pathological fracture: Secondary | ICD-10-CM | POA: Diagnosis not present

## 2012-10-23 DIAGNOSIS — IMO0002 Reserved for concepts with insufficient information to code with codable children: Secondary | ICD-10-CM | POA: Diagnosis not present

## 2012-10-25 ENCOUNTER — Encounter (HOSPITAL_COMMUNITY): Payer: Self-pay

## 2012-10-25 ENCOUNTER — Other Ambulatory Visit: Payer: Self-pay

## 2012-10-25 ENCOUNTER — Telehealth: Payer: Self-pay | Admitting: Surgery

## 2012-10-25 NOTE — Telephone Encounter (Addendum)
Message copied by Rosalyn Charters on Mon Oct 25, 2012  2:42 PM ------      Message from: Lamar Blinks S      Created: Mon Oct 25, 2012 12:46 PM      Regarding: needs consult with Dr. Arbie Cookey       Please schedule this pt.for consult with Dr. Arbie Cookey prior to [facility] on 11/03/12 (assisting Dr. Shon Baton) (hx. Of mult abdominal surgeries) needs to bring L-S spine films with her to appt.  ------ Notified patient of  appt. with dr. early on 10-26-12 11:45 prior to her surgery on 10-1312 AL[facility] (assisting dr. books) (hx of multi abd surgeries) notifed her to bring her l/s spine films

## 2012-10-25 NOTE — H&P (Signed)
History of Present Illness The patient is a 62 year old female who presents today for follow up of their back. The patient is being followed for their central back pain. They are now 5 month(s) out. Symptoms reported today include: pain, pain at night, aching, stiffness, catching, popping, giving way, difficulty ambulating, numbness (left side and left foot ) and leg pain (both legs are weak but left leg is worse). The patient states that they are doing poorly. The following medication has been used for pain control: Percocet (Morphine). The patient reports their current pain level to be moderate to severe and 6 / 10. The patient presents today following myelogram (done on 09/30/2012 at [facility]).     Subjective Transcription  Sol returns today for folllowup. She continues to have severe disabling low back, buttock and bilateral leg pain , the right side being a little bit more than the left.    Allergies No Known Drug Allergies. 10/31/2010   Social History Illicit drug use. no Children. 4 Copy of Drug/Alcohol Rehab (Previously). no Drug/Alcohol Rehab (Currently). no Exercise. Exercises daily; does running / walking Current work status. disabled Marital status. single Number of flights of stairs before winded. 1 Pain Contract. yes Living situation. live with caregiver Tobacco / smoke exposure. yes Alcohol use. never consumed alcohol Tobacco use. former smoker; smoke(d) 1 pack(s) per day   Medication History Robaxin (500MG  Tablet, 1 Oral TAKE 1 TABLET 3 TIMES DAILY PRN SPASM, Taken starting 08/26/2012) Active. Morphine Sulfate ER (30MG  Tablet ER, 1 (one) Tablet ER Oral every twelve hours, Taken starting 08/26/2012) Active. Lasix (20MG  Tablet, 1 Oral daily) Active. Lyrica (75MG  Capsule, Oral) Active. Ondansetron HCl (4MG  Tablet, Oral) Active. Vitamin C (500MG  Tablet, 1 Oral) Active. NexIUM (40MG  Capsule DR, Oral) Active. Citalopram  Hydrobromide (40MG  Tablet, Oral) Active. ClonazePAM (0.5MG  Tablet, Oral) Active. Lunesta (2MG  Tablet, Oral) Active.   Objective Transcription A+O X3 NVI - compartments soft/NT abd soft/NT  No incontinence of bowel/bladder Significant back pain - no radicular leg pain No sob/cp Loss of lumbar ROM due to pain Incision healed  RADIOGRAPHS:  Her myelogram clearly shows a solid fusion and no significant complication from her lumbar fusion T10 to L5. She has an L5-S1 degenerative adjacent segment with foraminal compromise. There is an anterolisthesis and foraminal narrowing.   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) Current Plans l Risks of surgery include, but are not limited to: Death, stroke, paralysis, nerve root damage/injury, bleeding, blood clots, loss of bowel/bladder control, sexual dysfunction, retrograde ejaculation, hardware failure, or malposition, spinal fluid leak, adjacent segment disease, non-union, need for further surgery, ongoing or worse pain, injury to bladder, bowel and abdominal contents, infection and recurrent disc herniation  l We have gone over the risks and benefits of surgery, which include infection, bleeding, nerve damage, death, stroke, paralysis, failure to heal, need for further surgery, ongoing or worse pain, loss of fixation, need for further surgery, CSF leak, loss of bowel or bladder control, ongoing or worse pain.  l Pt Education - How to access health information online: discussed with patient and provided information.   Plans Transcription  At this point in time, having tried injections, physical therapy, observation and pain medication she continues to have debilitating pain. We discussed the revision fusion to address the adjacent segment and she would like to proceed.    I think the best initial surgery would be an ALIF at L5-S1. This provides a large interbody cage for distraction and  stabilization. If she developed issues post operatively at a separate setting, we can always return and do a posterior pedicle screw supplementation. I would like to avoid this given the fact I would have to do a more extensive decompression due to the previous surgery.    I did review the risk with him to include infection, bleeding, nerve damage, death , stroke, paralysis, , failure to heal, ongoing or worse pain, loss of bowel and/or bladder control, injury to the bowel and bladder, blood clots and need for further surgery.    All questions were encouraged and answered. We will proceed with surgery in the very near future.

## 2012-10-26 ENCOUNTER — Encounter: Payer: Self-pay | Admitting: Vascular Surgery

## 2012-10-26 ENCOUNTER — Ambulatory Visit (INDEPENDENT_AMBULATORY_CARE_PROVIDER_SITE_OTHER): Payer: Medicare Other | Admitting: Vascular Surgery

## 2012-10-26 VITALS — BP 101/72 | HR 72 | Resp 16 | Ht 65.0 in | Wt 138.0 lb

## 2012-10-26 DIAGNOSIS — Z01818 Encounter for other preprocedural examination: Secondary | ICD-10-CM | POA: Diagnosis not present

## 2012-10-26 NOTE — Progress Notes (Signed)
[facility] [place]   Patient name: Amanda Guerrero MRN: [id number] DOB: Jun 13, 1950 Sex: female   Referred by: Shon Baton  Reason for referral:  Chief Complaint  Patient presents with  . New Evaluation    Consult for ALIF ref. by Dr. Shon Baton  C/O Back pain, duration 4-6 mo.  Pain in legs with walking and lying flat, duration 4+ years.  Bilateral numbness legs and feet 4-6 mo .  Dizziness off/on  4 mo.    HISTORY OF PRESENT ILLNESS: Patient 62 year old female with severe degenerative disc disease in her back. She has had prior posterior fusion at multiple levels. She's had persistent problems and is now scheduled for L5-S1 fusion from an anterior approach. I am seeing her today to discuss the our roll and exposure. She does not have any prior history of cardiac difficulties and no history of peripheral vascular occlusive disease. She has had a prior multiple abdominal procedures including appendectomy hysterectomy and bladder tacking.  Past Medical History  Diagnosis Date  . Cancer 2011-2012    [facility]- Face    Past Surgical History  Procedure Laterality Date  . Knee surgery  Feb. 2014    Left knee  . Joint replacement Right 2006    Hip-Total  . Appendectomy  1980  . Wrist surgery Right   . Carpal tunnel release Bilateral   . Tarsal tunnel release Bilateral   . Abdominal hysterectomy      Total  . Rotator cuff repair Left     Shoulder  . Bunionectomy Right     foot  . Spine surgery      Upper and lower fusion    History   Social History  . Marital Status: Legally Separated    Spouse Name: N/A    Number of Children: N/A  . Years of Education: N/A   Occupational History  . Not on file.   Social History Main Topics  . Smoking status: Former Smoker -- 1.00 packs/day for 7 years    Types: Cigarettes    Quit date: 09/30/2004  . Smokeless tobacco: Never Used  . Alcohol Use: No  . Drug Use: No  . Sexually Active: Not on file   Other Topics Concern  .  Not on file   Social History Narrative  . No narrative on file    Family History  Problem Relation Age of Onset  . Heart disease Mother   . Hyperlipidemia Mother   . Hypertension Mother   . Deep vein thrombosis Mother   . Heart attack Mother   . Cancer Father   . Hyperlipidemia Father   . Hypertension Father   . Deep vein thrombosis Father   . Arthritis Father   . Arthritis Maternal Uncle   . Arthritis Paternal Aunt     Allergies as of 10/26/2012 - Review Complete 10/26/2012  Allergen Reaction Noted  . Meloxicam Itching and Swelling 10/26/2012    Current Outpatient Prescriptions on File Prior to Visit  Medication Sig Dispense Refill  . Ascorbic Acid (VITAMIN C) 1000 MG tablet Take 1,000 mg by mouth daily.      . citalopram (CELEXA) 40 MG tablet Take 40 mg by mouth daily.      . clonazePAM (KLONOPIN) 0.5 MG tablet Take 0.5 mg by mouth 3 (three) times daily as needed for anxiety.      Marland Kitchen esomeprazole (NEXIUM) 40 MG capsule Take 40 mg by mouth 2 (two) times daily.      Marland Kitchen  eszopiclone (LUNESTA) 2 MG TABS tablet Take 2 mg by mouth at bedtime. Take immediately before bedtime      . furosemide (LASIX) 40 MG tablet Take 40 mg by mouth daily.      Marland Kitchen morphine (MS CONTIN) 30 MG 12 hr tablet Take 30 mg by mouth 2 (two) times daily.      Marland Kitchen oxyCODONE-acetaminophen (PERCOCET) 10-325 MG per tablet Take 1 tablet by mouth 2 (two) times daily.      . pregabalin (LYRICA) 75 MG capsule Take 75 mg by mouth 3 (three) times daily.       No current facility-administered medications on file prior to visit.     REVIEW OF SYSTEMS:  Positives indicated with an "X"  CARDIOVASCULAR:  [ ]  chest pain   [ ]  chest pressure   [ ]  palpitations   [ ]  orthopnea   [ ]  dyspnea on exertion   [ ]  claudication   [ ]  rest pain   [ ]  DVT   [ ]  phlebitis PULMONARY:   [ ]  productive cough   [ ]  asthma   [ ]  wheezing NEUROLOGIC:   [x ] weakness  [x ] paresthesias  [ ]  aphasia  [ ]  amaurosis  [x ]  dizziness HEMATOLOGIC:   [ ]  bleeding problems   [ ]  clotting disorders MUSCULOSKELETAL:  [ ]  joint pain   [ ]  joint swelling GASTROINTESTINAL: [ ]   blood in stool  [ ]   hematemesis GENITOURINARY:  [ ]   dysuria  [ ]   hematuria PSYCHIATRIC:  [x ] history of major depression INTEGUMENTARY:  [ ]  rashes  [ ]  ulcers CONSTITUTIONAL:  [ ]  fever   [ ]  chills  PHYSICAL EXAMINATION:  General: The patient is a well-nourished female, in no acute distress. Vital signs are BP 101/72  Pulse 72  Resp 16  Ht 5\' 5"  (1.651 m)  Wt 138 lb (62.596 kg)  BMI 22.96 kg/m2  SpO2 98% Pulmonary: There is a good air exchange bilaterally without wheezing or rales. Abdomen: Soft and non-tender with normal pitch bowel sounds. Soft and her left lower quadrant with no incisions Musculoskeletal: There are no major deformities.  There is no significant extremity pain. Neurologic: No focal weakness or paresthesias are detected, Skin: There are no ulcer or rashes noted. Psychiatric: The patient has normal affect. Cardiovascular: There is a regular rate and rhythm without significant murmur appreciated. Pulse status 2+ radial and 2+ dorsalis pedis pulses bilaterally  I reviewed her CT lumbar study. This does show her distal aortic and iliac vessels with some minimal calcifications present.  Impression and Plan:  Severe degenerative disc disease with plan for anterior exposure with Dr. Shon Baton. I discussed our role and exposure to include mobilization of intraperitoneal contents, left ureter and iliac arteries and veins and potential injury of these. For that she is at low risk with prior pelvic surgery but no a lower abdominal surgery. Her surgery scheduled for 8/13 and we will be available to assist    Connar Keating [facility] [place] Office: [phone]

## 2012-10-29 DIAGNOSIS — M810 Age-related osteoporosis without current pathological fracture: Secondary | ICD-10-CM | POA: Diagnosis not present

## 2012-10-29 DIAGNOSIS — IMO0002 Reserved for concepts with insufficient information to code with codable children: Secondary | ICD-10-CM | POA: Diagnosis not present

## 2012-10-29 DIAGNOSIS — K219 Gastro-esophageal reflux disease without esophagitis: Secondary | ICD-10-CM | POA: Diagnosis not present

## 2012-10-29 DIAGNOSIS — M159 Polyosteoarthritis, unspecified: Secondary | ICD-10-CM | POA: Diagnosis not present

## 2012-11-01 ENCOUNTER — Encounter (HOSPITAL_COMMUNITY): Payer: Self-pay

## 2012-11-01 ENCOUNTER — Encounter (HOSPITAL_COMMUNITY)
Admission: RE | Admit: 2012-11-01 | Discharge: 2012-11-01 | Disposition: A | Discharge status: Home / Self Care | Payer: Medicare Other | Source: Ambulatory Visit | Attending: Orthopedic Surgery | Admitting: Orthopedic Surgery

## 2012-11-01 DIAGNOSIS — M5137 Other intervertebral disc degeneration, lumbosacral region: Secondary | ICD-10-CM | POA: Diagnosis not present

## 2012-11-01 DIAGNOSIS — Z01812 Encounter for preprocedural laboratory examination: Secondary | ICD-10-CM | POA: Diagnosis not present

## 2012-11-01 DIAGNOSIS — Z86010 Personal history of colonic polyps: Secondary | ICD-10-CM | POA: Diagnosis not present

## 2012-11-01 DIAGNOSIS — Z01818 Encounter for other preprocedural examination: Secondary | ICD-10-CM | POA: Diagnosis not present

## 2012-11-01 DIAGNOSIS — F329 Major depressive disorder, single episode, unspecified: Secondary | ICD-10-CM | POA: Diagnosis present

## 2012-11-01 DIAGNOSIS — F411 Generalized anxiety disorder: Secondary | ICD-10-CM | POA: Diagnosis not present

## 2012-11-01 DIAGNOSIS — M539 Dorsopathy, unspecified: Secondary | ICD-10-CM | POA: Diagnosis not present

## 2012-11-01 DIAGNOSIS — K219 Gastro-esophageal reflux disease without esophagitis: Secondary | ICD-10-CM | POA: Diagnosis not present

## 2012-11-01 DIAGNOSIS — Z8614 Personal history of Methicillin resistant Staphylococcus aureus infection: Secondary | ICD-10-CM | POA: Diagnosis not present

## 2012-11-01 DIAGNOSIS — Z79899 Other long term (current) drug therapy: Secondary | ICD-10-CM | POA: Diagnosis not present

## 2012-11-01 DIAGNOSIS — M533 Sacrococcygeal disorders, not elsewhere classified: Secondary | ICD-10-CM | POA: Diagnosis not present

## 2012-11-01 DIAGNOSIS — R935 Abnormal findings on diagnostic imaging of other abdominal regions, including retroperitoneum: Secondary | ICD-10-CM | POA: Diagnosis not present

## 2012-11-01 DIAGNOSIS — M51379 Other intervertebral disc degeneration, lumbosacral region without mention of lumbar back pain or lower extremity pain: Secondary | ICD-10-CM | POA: Diagnosis present

## 2012-11-01 DIAGNOSIS — Z87891 Personal history of nicotine dependence: Secondary | ICD-10-CM | POA: Diagnosis not present

## 2012-11-01 DIAGNOSIS — M2559 Pain in other specified joint: Secondary | ICD-10-CM | POA: Diagnosis not present

## 2012-11-01 DIAGNOSIS — M549 Dorsalgia, unspecified: Secondary | ICD-10-CM | POA: Diagnosis not present

## 2012-11-01 DIAGNOSIS — Z09 Encounter for follow-up examination after completed treatment for conditions other than malignant neoplasm: Secondary | ICD-10-CM | POA: Diagnosis not present

## 2012-11-01 DIAGNOSIS — Z7901 Long term (current) use of anticoagulants: Secondary | ICD-10-CM | POA: Diagnosis not present

## 2012-11-01 DIAGNOSIS — G609 Hereditary and idiopathic neuropathy, unspecified: Secondary | ICD-10-CM | POA: Diagnosis not present

## 2012-11-01 DIAGNOSIS — G8929 Other chronic pain: Secondary | ICD-10-CM | POA: Diagnosis present

## 2012-11-01 DIAGNOSIS — G4700 Insomnia, unspecified: Secondary | ICD-10-CM | POA: Diagnosis present

## 2012-11-01 HISTORY — DX: Gastro-esophageal reflux disease without esophagitis: K21.9

## 2012-11-01 HISTORY — DX: Polyneuropathy, unspecified: G62.9

## 2012-11-01 HISTORY — DX: Personal history of other infectious and parasitic diseases: Z86.19

## 2012-11-01 HISTORY — DX: Edema, unspecified: R60.9

## 2012-11-01 HISTORY — DX: Dorsalgia, unspecified: M54.9

## 2012-11-01 HISTORY — DX: Other chronic pain: G89.29

## 2012-11-01 HISTORY — DX: Constipation, unspecified: K59.00

## 2012-11-01 HISTORY — DX: Personal history of colonic polyps: Z86.010

## 2012-11-01 HISTORY — DX: Depression, unspecified: F32.A

## 2012-11-01 HISTORY — DX: Personal history of other medical treatment: Z92.89

## 2012-11-01 HISTORY — DX: Personal history of colon polyps, unspecified: Z86.0100

## 2012-11-01 HISTORY — DX: Anxiety disorder, unspecified: F41.9

## 2012-11-01 HISTORY — DX: Weakness: R53.1

## 2012-11-01 HISTORY — DX: Urgency of urination: R39.15

## 2012-11-01 HISTORY — DX: Localized edema: R60.0

## 2012-11-01 HISTORY — DX: Pain in unspecified joint: M25.50

## 2012-11-01 HISTORY — DX: Other skin changes: R23.8

## 2012-11-01 HISTORY — DX: Insomnia, unspecified: G47.00

## 2012-11-01 HISTORY — DX: Major depressive disorder, single episode, unspecified: F32.9

## 2012-11-01 HISTORY — DX: Unspecified osteoarthritis, unspecified site: M19.90

## 2012-11-01 HISTORY — DX: Spontaneous ecchymoses: R23.3

## 2012-11-01 HISTORY — DX: Personal history of Methicillin resistant Staphylococcus aureus infection: Z86.14

## 2012-11-01 HISTORY — DX: Personal history of other diseases of the nervous system and sense organs: Z86.69

## 2012-11-01 HISTORY — DX: Frequency of micturition: R35.0

## 2012-11-01 LAB — BASIC METABOLIC PANEL
BUN: 10 mg/dL (ref 6–23)
CO2: 28 mEq/L (ref 19–32)
Calcium: 9.9 mg/dL (ref 8.4–10.5)
Chloride: 101 mEq/L (ref 96–112)
Creatinine, Ser: 0.61 mg/dL (ref 0.50–1.10)
GFR calc Af Amer: >90 mL/min (ref >90)
GFR calc non Af Amer: >90 mL/min (ref >90)
Glucose, Bld: 83 mg/dL (ref 70–99)
Potassium: 3.6 mEq/L (ref 3.5–5.1)
Sodium: 140 mEq/L (ref 135–145)

## 2012-11-01 LAB — CBC
HCT: 38.3 % (ref 36.0–46.0)
Hemoglobin: 13.3 g/dL (ref 12.0–15.0)
MCH: 31.1 pg (ref 26.0–34.0)
MCHC: 34.7 g/dL (ref 30.0–36.0)
MCV: 89.5 fL (ref 78.0–100.0)
Platelets: 252 10*3/uL (ref 150–400)
RBC: 4.28 MIL/uL (ref 3.87–5.11)
RDW: 14.1 % (ref 11.5–15.5)
WBC: 5.4 10*3/uL (ref 4.0–10.5)

## 2012-11-01 LAB — SURGICAL PCR SCREEN
MRSA, PCR: NEGATIVE
Staphylococcus aureus: POSITIVE — AB

## 2012-11-01 NOTE — Progress Notes (Signed)
Pt doesn't have a cardiologist  Denies ever having an echo/stress test/heart cath  Dr.Keung Lee in [place] is MEdical MD  Dr.Lee did an EKG in office a week ago -to request  Denies CXR in past yr

## 2012-11-01 NOTE — Pre-Procedure Instructions (Signed)
Amanda Guerrero  11/01/2012   Your procedure is scheduled on:  Wed, Aug 13 @ 8:30 AM  Report to [facility] at 6:30 AM.  Call this number if you have problems the morning of surgery: [phone]   Remember:   Do not eat food or drink liquids after midnight.   Take these medicines the morning of surgery with A SIP OF WATER: Citalopram(Celexa),Clonazepam(Klonopin-if needed),Nexium(Esomeprazole),Lyrica(Pregabalin),and Pain Pill(if needed)              No Aspirin,Goody's,BC's,Aleve,Ibuprofen,Fish Oil,or any Herbal Medications   Do not wear jewelry, make-up or nail polish.  Do not wear lotions, powders, or perfumes. You may wear deodorant.  Do not shave 48 hours prior to surgery.   Do not bring valuables to the hospital.  [facility] is not responsible                   for any belongings or valuables.  Contacts, dentures or bridgework may not be worn into surgery.  Leave suitcase in the car. After surgery it may be brought to your room.  For patients admitted to the hospital, checkout time is 11:00 AM the day of  discharge.     Special Instructions: Shower using CHG 2 nights before surgery and the night before surgery.  If you shower the day of surgery use CHG.  Use special wash - you have one bottle of CHG for all showers.  You should use approximately 1/3 of the bottle for each shower.   Please read over the following fact sheets that you were given: Pain Booklet, Coughing and Deep Breathing, Blood Transfusion Information, MRSA Information and Surgical Site Infection Prevention

## 2012-11-02 LAB — TYPE AND SCREEN
ABO/RH(D): A POS
Antibody Screen: NEGATIVE

## 2012-11-02 MED ORDER — CEFAZOLIN SODIUM-DEXTROSE 2-3 GM-% IV SOLR
2.0000 g | INTRAVENOUS | Status: AC
  Administered 2012-11-03: 2 g via INTRAVENOUS
  Filled 2012-11-02: qty 50

## 2012-11-02 NOTE — Progress Notes (Signed)
Call to [facility] spoke /w Hope, requested most recent EKG & last OV note.

## 2012-11-03 ENCOUNTER — Inpatient Hospital Stay (HOSPITAL_COMMUNITY): Payer: Medicare Other

## 2012-11-03 ENCOUNTER — Encounter (HOSPITAL_COMMUNITY)
Admission: RE | Disposition: A | Discharge status: Home / Self Care | Payer: Self-pay | Source: Ambulatory Visit | Attending: Orthopedic Surgery

## 2012-11-03 ENCOUNTER — Inpatient Hospital Stay (HOSPITAL_COMMUNITY)
Admission: RE | Admit: 2012-11-03 | Discharge: 2012-11-05 | DRG: 460 | Disposition: A | Discharge status: Home / Self Care | Payer: Medicare Other | Source: Ambulatory Visit | Attending: Orthopedic Surgery | Admitting: Orthopedic Surgery

## 2012-11-03 ENCOUNTER — Encounter (HOSPITAL_COMMUNITY): Payer: Self-pay | Admitting: Anesthesiology

## 2012-11-03 ENCOUNTER — Inpatient Hospital Stay (HOSPITAL_COMMUNITY): Payer: Medicare Other | Admitting: Anesthesiology

## 2012-11-03 ENCOUNTER — Encounter (HOSPITAL_COMMUNITY): Payer: Self-pay | Admitting: *Deleted

## 2012-11-03 DIAGNOSIS — K219 Gastro-esophageal reflux disease without esophagitis: Secondary | ICD-10-CM | POA: Diagnosis present

## 2012-11-03 DIAGNOSIS — F411 Generalized anxiety disorder: Secondary | ICD-10-CM | POA: Diagnosis present

## 2012-11-03 DIAGNOSIS — G609 Hereditary and idiopathic neuropathy, unspecified: Secondary | ICD-10-CM | POA: Diagnosis present

## 2012-11-03 DIAGNOSIS — Z8614 Personal history of Methicillin resistant Staphylococcus aureus infection: Secondary | ICD-10-CM

## 2012-11-03 DIAGNOSIS — F329 Major depressive disorder, single episode, unspecified: Secondary | ICD-10-CM | POA: Diagnosis present

## 2012-11-03 DIAGNOSIS — Z87891 Personal history of nicotine dependence: Secondary | ICD-10-CM

## 2012-11-03 DIAGNOSIS — M5137 Other intervertebral disc degeneration, lumbosacral region: Principal | ICD-10-CM | POA: Diagnosis present

## 2012-11-03 DIAGNOSIS — M51379 Other intervertebral disc degeneration, lumbosacral region without mention of lumbar back pain or lower extremity pain: Principal | ICD-10-CM | POA: Diagnosis present

## 2012-11-03 DIAGNOSIS — M549 Dorsalgia, unspecified: Secondary | ICD-10-CM

## 2012-11-03 DIAGNOSIS — Z01812 Encounter for preprocedural laboratory examination: Secondary | ICD-10-CM

## 2012-11-03 DIAGNOSIS — Z860100 Personal history of colon polyps, unspecified: Secondary | ICD-10-CM

## 2012-11-03 DIAGNOSIS — G4700 Insomnia, unspecified: Secondary | ICD-10-CM | POA: Diagnosis present

## 2012-11-03 DIAGNOSIS — Z7901 Long term (current) use of anticoagulants: Secondary | ICD-10-CM

## 2012-11-03 DIAGNOSIS — F3289 Other specified depressive episodes: Secondary | ICD-10-CM | POA: Diagnosis present

## 2012-11-03 DIAGNOSIS — Z79899 Other long term (current) drug therapy: Secondary | ICD-10-CM

## 2012-11-03 DIAGNOSIS — G8929 Other chronic pain: Secondary | ICD-10-CM | POA: Diagnosis present

## 2012-11-03 HISTORY — PX: ABDOMINAL EXPOSURE: SHX5708

## 2012-11-03 HISTORY — PX: ANTERIOR LUMBAR FUSION: SHX1170

## 2012-11-03 SURGERY — ANTERIOR LUMBAR FUSION 1 LEVEL
Anesthesia: General | Site: Abdomen | Wound class: Clean

## 2012-11-03 MED ORDER — PHENYLEPHRINE HCL 10 MG/ML IJ SOLN
INTRAMUSCULAR | Status: DC | PRN
  Administered 2012-11-03 (×2): 80 ug via INTRAVENOUS

## 2012-11-03 MED ORDER — SODIUM CHLORIDE 0.9 % IJ SOLN: 9.0000 mL | INTRAMUSCULAR | Status: DC | PRN

## 2012-11-03 MED ORDER — THROMBIN 20000 UNITS EX SOLR
CUTANEOUS | Status: DC | PRN
  Administered 2012-11-03: 11:00:00 via TOPICAL

## 2012-11-03 MED ORDER — HYDROMORPHONE HCL PF 1 MG/ML IJ SOLN
0.2500 mg | INTRAMUSCULAR | Status: DC | PRN
  Administered 2012-11-03 (×2): 0.25 mg via INTRAVENOUS

## 2012-11-03 MED ORDER — SODIUM CHLORIDE 0.9 % IV SOLN: 250.0000 mL | INTRAVENOUS | Status: DC

## 2012-11-03 MED ORDER — ONDANSETRON HCL 4 MG/2ML IJ SOLN
INTRAMUSCULAR | Status: DC | PRN
  Administered 2012-11-03: 4 mg via INTRAVENOUS

## 2012-11-03 MED ORDER — DIPHENHYDRAMINE HCL 50 MG/ML IJ SOLN: 12.5000 mg | Freq: Four times a day (QID) | INTRAMUSCULAR | Status: DC | PRN

## 2012-11-03 MED ORDER — EPHEDRINE SULFATE 50 MG/ML IJ SOLN
INTRAMUSCULAR | Status: DC | PRN
  Administered 2012-11-03: 15 mg via INTRAVENOUS

## 2012-11-03 MED ORDER — DEXAMETHASONE SODIUM PHOSPHATE 4 MG/ML IJ SOLN
4.0000 mg | Freq: Four times a day (QID) | INTRAMUSCULAR | Status: DC
  Filled 2012-11-03 (×11): qty 1

## 2012-11-03 MED ORDER — SODIUM CHLORIDE 0.9 % IJ SOLN: 3.0000 mL | INTRAMUSCULAR | Status: DC | PRN

## 2012-11-03 MED ORDER — MORPHINE SULFATE ER 15 MG PO TBCR
30.0000 mg | EXTENDED_RELEASE_TABLET | Freq: Two times a day (BID) | ORAL | Status: DC
  Administered 2012-11-03 – 2012-11-05 (×4): 30 mg via ORAL
  Filled 2012-11-03 (×4): qty 2

## 2012-11-03 MED ORDER — BUPIVACAINE-EPINEPHRINE PF 0.25-1:200000 % IJ SOLN
INTRAMUSCULAR | Status: AC
  Filled 2012-11-03: qty 30

## 2012-11-03 MED ORDER — OXYCODONE HCL 5 MG PO TABS
10.0000 mg | ORAL_TABLET | ORAL | Status: DC | PRN
  Administered 2012-11-04 (×3): 10 mg via ORAL
  Filled 2012-11-03 (×3): qty 2

## 2012-11-03 MED ORDER — NEOSTIGMINE METHYLSULFATE 1 MG/ML IJ SOLN
INTRAMUSCULAR | Status: DC | PRN
  Administered 2012-11-03: 3 mg via INTRAVENOUS

## 2012-11-03 MED ORDER — LIDOCAINE HCL (CARDIAC) 20 MG/ML IV SOLN
INTRAVENOUS | Status: DC | PRN
  Administered 2012-11-03: 60 mg via INTRAVENOUS

## 2012-11-03 MED ORDER — ONDANSETRON HCL 4 MG/2ML IJ SOLN: 4.0000 mg | INTRAMUSCULAR | Status: DC | PRN

## 2012-11-03 MED ORDER — SODIUM CHLORIDE 0.9 % IJ SOLN
3.0000 mL | Freq: Two times a day (BID) | INTRAMUSCULAR | Status: DC
  Administered 2012-11-03 – 2012-11-04 (×2): 3 mL via INTRAVENOUS

## 2012-11-03 MED ORDER — OXYCODONE HCL 5 MG PO TABS
5.0000 mg | ORAL_TABLET | Freq: Once | ORAL | Status: AC | PRN
  Administered 2012-11-03: 5 mg via ORAL

## 2012-11-03 MED ORDER — PHENOL 1.4 % MT LIQD: 1.0000 | OROMUCOSAL | Status: DC | PRN

## 2012-11-03 MED ORDER — LACTATED RINGERS IV SOLN
INTRAVENOUS | Status: DC | PRN
  Administered 2012-11-03 (×2): via INTRAVENOUS

## 2012-11-03 MED ORDER — ONDANSETRON HCL 4 MG/2ML IJ SOLN: 4.0000 mg | Freq: Four times a day (QID) | INTRAMUSCULAR | Status: DC | PRN

## 2012-11-03 MED ORDER — FUROSEMIDE 40 MG PO TABS
40.0000 mg | ORAL_TABLET | Freq: Every day | ORAL | Status: DC
  Administered 2012-11-03 – 2012-11-05 (×3): 40 mg via ORAL
  Filled 2012-11-03 (×3): qty 1

## 2012-11-03 MED ORDER — THROMBIN 20000 UNITS EX SOLR
CUTANEOUS | Status: AC
  Filled 2012-11-03: qty 20000

## 2012-11-03 MED ORDER — DOCUSATE SODIUM 100 MG PO CAPS
100.0000 mg | ORAL_CAPSULE | Freq: Two times a day (BID) | ORAL | Status: DC
  Administered 2012-11-03 – 2012-11-05 (×5): 100 mg via ORAL
  Filled 2012-11-03 (×6): qty 1

## 2012-11-03 MED ORDER — ROCURONIUM BROMIDE 100 MG/10ML IV SOLN
INTRAVENOUS | Status: DC | PRN
  Administered 2012-11-03: 50 mg via INTRAVENOUS
  Administered 2012-11-03: 10 mg via INTRAVENOUS
  Administered 2012-11-03: 20 mg via INTRAVENOUS

## 2012-11-03 MED ORDER — OXYCODONE HCL 5 MG/5ML PO SOLN: 5.0000 mg | Freq: Once | ORAL | Status: AC | PRN

## 2012-11-03 MED ORDER — METHOCARBAMOL 100 MG/ML IJ SOLN
500.0000 mg | Freq: Four times a day (QID) | INTRAMUSCULAR | Status: DC | PRN
  Filled 2012-11-03: qty 5

## 2012-11-03 MED ORDER — MORPHINE SULFATE (PF) 1 MG/ML IV SOLN
INTRAVENOUS | Status: AC
  Filled 2012-11-03: qty 25

## 2012-11-03 MED ORDER — CLONAZEPAM 0.5 MG PO TABS: 0.5000 mg | ORAL_TABLET | Freq: Three times a day (TID) | ORAL | Status: DC | PRN

## 2012-11-03 MED ORDER — DEXAMETHASONE 4 MG PO TABS
4.0000 mg | ORAL_TABLET | Freq: Four times a day (QID) | ORAL | Status: DC
  Administered 2012-11-03 – 2012-11-05 (×8): 4 mg via ORAL
  Filled 2012-11-03 (×11): qty 1

## 2012-11-03 MED ORDER — MUPIROCIN 2 % EX OINT
TOPICAL_OINTMENT | CUTANEOUS | Status: AC
  Filled 2012-11-03: qty 22

## 2012-11-03 MED ORDER — ACETAMINOPHEN 10 MG/ML IV SOLN
INTRAVENOUS | Status: AC
  Filled 2012-11-03: qty 100

## 2012-11-03 MED ORDER — BUPIVACAINE-EPINEPHRINE 0.25% -1:200000 IJ SOLN
INTRAMUSCULAR | Status: DC | PRN
  Administered 2012-11-03: 30 mL

## 2012-11-03 MED ORDER — METHOCARBAMOL 500 MG PO TABS
500.0000 mg | ORAL_TABLET | Freq: Four times a day (QID) | ORAL | Status: DC | PRN
  Administered 2012-11-04 (×2): 500 mg via ORAL
  Filled 2012-11-03 (×2): qty 1

## 2012-11-03 MED ORDER — DEXAMETHASONE SODIUM PHOSPHATE 4 MG/ML IJ SOLN: 4.0000 mg | Freq: Once | INTRAMUSCULAR | Status: DC

## 2012-11-03 MED ORDER — MIDAZOLAM HCL 5 MG/5ML IJ SOLN
INTRAMUSCULAR | Status: DC | PRN
  Administered 2012-11-03: 2 mg via INTRAVENOUS

## 2012-11-03 MED ORDER — NALOXONE HCL 0.4 MG/ML IJ SOLN: 0.4000 mg | INTRAMUSCULAR | Status: DC | PRN

## 2012-11-03 MED ORDER — OXYCODONE HCL 5 MG PO TABS
ORAL_TABLET | ORAL | Status: AC
  Filled 2012-11-03: qty 1

## 2012-11-03 MED ORDER — ZOLPIDEM TARTRATE 5 MG PO TABS: 5.0000 mg | ORAL_TABLET | Freq: Every evening | ORAL | Status: DC | PRN

## 2012-11-03 MED ORDER — ACETAMINOPHEN 500 MG PO TABS
1000.0000 mg | ORAL_TABLET | Freq: Four times a day (QID) | ORAL | Status: AC
  Administered 2012-11-03 – 2012-11-04 (×4): 1000 mg via ORAL
  Filled 2012-11-03 (×4): qty 2

## 2012-11-03 MED ORDER — MORPHINE SULFATE (PF) 1 MG/ML IV SOLN
INTRAVENOUS | Status: DC
  Administered 2012-11-03: 1 mg via INTRAVENOUS

## 2012-11-03 MED ORDER — PROPOFOL 10 MG/ML IV BOLUS
INTRAVENOUS | Status: DC | PRN
  Administered 2012-11-03: 120 mg via INTRAVENOUS

## 2012-11-03 MED ORDER — PREGABALIN 50 MG PO CAPS
75.0000 mg | ORAL_CAPSULE | Freq: Three times a day (TID) | ORAL | Status: DC
  Administered 2012-11-03 – 2012-11-05 (×6): 75 mg via ORAL
  Filled 2012-11-03 (×4): qty 1
  Filled 2012-11-03: qty 3
  Filled 2012-11-03: qty 1

## 2012-11-03 MED ORDER — ACETAMINOPHEN 10 MG/ML IV SOLN
1000.0000 mg | Freq: Four times a day (QID) | INTRAVENOUS | Status: DC
  Administered 2012-11-03: 1000 mg via INTRAVENOUS

## 2012-11-03 MED ORDER — LACTATED RINGERS IV SOLN
INTRAVENOUS | Status: DC
  Administered 2012-11-03: 14:00:00 via INTRAVENOUS

## 2012-11-03 MED ORDER — FLEET ENEMA 7-19 GM/118ML RE ENEM: 1.0000 | ENEMA | Freq: Once | RECTAL | Status: AC | PRN

## 2012-11-03 MED ORDER — ACETAMINOPHEN 10 MG/ML IV SOLN
1000.0000 mg | Freq: Four times a day (QID) | INTRAVENOUS | Status: DC
  Filled 2012-11-03 (×3): qty 100

## 2012-11-03 MED ORDER — DIPHENHYDRAMINE HCL 12.5 MG/5ML PO ELIX: 12.5000 mg | ORAL_SOLUTION | Freq: Four times a day (QID) | ORAL | Status: DC | PRN

## 2012-11-03 MED ORDER — 0.9 % SODIUM CHLORIDE (POUR BTL) OPTIME
TOPICAL | Status: DC | PRN
  Administered 2012-11-03: 1000 mL

## 2012-11-03 MED ORDER — HYDROMORPHONE HCL PF 1 MG/ML IJ SOLN
INTRAMUSCULAR | Status: AC
  Filled 2012-11-03: qty 1

## 2012-11-03 MED ORDER — CEFAZOLIN SODIUM 1-5 GM-% IV SOLN
1.0000 g | Freq: Three times a day (TID) | INTRAVENOUS | Status: AC
  Administered 2012-11-03 – 2012-11-04 (×2): 1 g via INTRAVENOUS
  Filled 2012-11-03 (×2): qty 50

## 2012-11-03 MED ORDER — DEXAMETHASONE SODIUM PHOSPHATE 10 MG/ML IJ SOLN
INTRAMUSCULAR | Status: AC
  Administered 2012-11-03: 10 mg via INTRAVENOUS
  Filled 2012-11-03: qty 1

## 2012-11-03 MED ORDER — GLYCOPYRROLATE 0.2 MG/ML IJ SOLN
INTRAMUSCULAR | Status: DC | PRN
  Administered 2012-11-03: 0.4 mg via INTRAVENOUS

## 2012-11-03 MED ORDER — ENOXAPARIN SODIUM 40 MG/0.4ML ~~LOC~~ SOLN
40.0000 mg | SUBCUTANEOUS | Status: DC
  Administered 2012-11-04 – 2012-11-05 (×2): 40 mg via SUBCUTANEOUS
  Filled 2012-11-03 (×3): qty 0.4

## 2012-11-03 MED ORDER — CITALOPRAM HYDROBROMIDE 40 MG PO TABS
40.0000 mg | ORAL_TABLET | Freq: Every day | ORAL | Status: DC
  Administered 2012-11-03 – 2012-11-05 (×3): 40 mg via ORAL
  Filled 2012-11-03 (×3): qty 1

## 2012-11-03 MED ORDER — MENTHOL 3 MG MT LOZG: 1.0000 | LOZENGE | OROMUCOSAL | Status: DC | PRN

## 2012-11-03 MED ORDER — PANTOPRAZOLE SODIUM 40 MG PO TBEC
40.0000 mg | DELAYED_RELEASE_TABLET | Freq: Every day | ORAL | Status: DC
  Administered 2012-11-04 – 2012-11-05 (×2): 40 mg via ORAL
  Filled 2012-11-03 (×2): qty 1

## 2012-11-03 MED ORDER — FENTANYL CITRATE 0.05 MG/ML IJ SOLN
INTRAMUSCULAR | Status: DC | PRN
  Administered 2012-11-03 (×6): 100 ug via INTRAVENOUS

## 2012-11-03 SURGICAL SUPPLY — 94 items
ADH SKN CLS APL DERMABOND .7 (GAUZE/BANDAGES/DRESSINGS) ×2
APPLIER CLIP 11 MED OPEN (CLIP) ×2
APR CLP MED 11 20 MLT OPN (CLIP) ×1
BLADE SURG 10 STRL SS (BLADE) ×2 IMPLANT
BLADE SURG ROTATE 9660 (MISCELLANEOUS) IMPLANT
CLIP APPLIE 11 MED OPEN (CLIP) ×1 IMPLANT
CLIP LIGATING EXTRA MED SLVR (CLIP) ×2 IMPLANT
CLIP LIGATING EXTRA SM BLUE (MISCELLANEOUS) ×2 IMPLANT
CLOTH BEACON ORANGE TIMEOUT ST (SAFETY) ×4 IMPLANT
CLSR STERI-STRIP ANTIMIC 1/2X4 (GAUZE/BANDAGES/DRESSINGS) ×2 IMPLANT
CONT SPEC 4OZ CLIKSEAL STRL BL (MISCELLANEOUS) ×2 IMPLANT
CORDS BIPOLAR (ELECTRODE) ×2 IMPLANT
COVER SURGICAL LIGHT HANDLE (MISCELLANEOUS) ×4 IMPLANT
DERMABOND ADVANCED (GAUZE/BANDAGES/DRESSINGS) ×2
DERMABOND ADVANCED .7 DNX12 (GAUZE/BANDAGES/DRESSINGS) ×2 IMPLANT
DRAPE C-ARM 42X72 X-RAY (DRAPES) ×4 IMPLANT
DRAPE INCISE IOBAN 66X45 STRL (DRAPES) IMPLANT
DRAPE POUCH INSTRU U-SHP 10X18 (DRAPES) ×2 IMPLANT
DRAPE SURG 17X23 STRL (DRAPES) ×2 IMPLANT
DRAPE U-SHAPE 47X51 STRL (DRAPES) ×2 IMPLANT
DRSG MEPILEX BORDER 4X8 (GAUZE/BANDAGES/DRESSINGS) ×2 IMPLANT
DURAPREP 26ML APPLICATOR (WOUND CARE) ×2 IMPLANT
ELECT BLADE 4.0 EZ CLEAN MEGAD (MISCELLANEOUS) ×2
ELECT CAUTERY BLADE 6.4 (BLADE) ×2 IMPLANT
ELECT REM PT RETURN 9FT ADLT (ELECTROSURGICAL) ×2
ELECTRODE BLDE 4.0 EZ CLN MEGD (MISCELLANEOUS) ×1 IMPLANT
ELECTRODE REM PT RTRN 9FT ADLT (ELECTROSURGICAL) ×1 IMPLANT
GAUZE SPONGE 4X4 16PLY XRAY LF (GAUZE/BANDAGES/DRESSINGS) IMPLANT
GLOVE BIOGEL PI IND STRL 6.5 (GLOVE) ×1 IMPLANT
GLOVE BIOGEL PI IND STRL 8.5 (GLOVE) ×2 IMPLANT
GLOVE BIOGEL PI INDICATOR 6.5 (GLOVE) ×1
GLOVE BIOGEL PI INDICATOR 8.5 (GLOVE) ×2
GLOVE ECLIPSE 6.0 STRL STRAW (GLOVE) ×2 IMPLANT
GLOVE ECLIPSE 8.5 STRL (GLOVE) ×4 IMPLANT
GLOVE SS BIOGEL STRL SZ 7.5 (GLOVE) ×1 IMPLANT
GLOVE SUPERSENSE BIOGEL SZ 7.5 (GLOVE) ×1
GOWN PREVENTION PLUS XXLARGE (GOWN DISPOSABLE) ×4 IMPLANT
GOWN STRL NON-REIN LRG LVL3 (GOWN DISPOSABLE) ×6 IMPLANT
GRANULES STERILE 5CC (Bone Implant) ×2 IMPLANT
HEMOSTAT SNOW SURGICEL 2X4 (HEMOSTASIS) IMPLANT
INSERT FOGARTY 61MM (MISCELLANEOUS) IMPLANT
INSERT FOGARTY SM (MISCELLANEOUS) IMPLANT
INTERPLATE 34X14X12 (Plate) ×2 IMPLANT
KIT BASIN OR (CUSTOM PROCEDURE TRAY) ×2 IMPLANT
KIT ROOM TURNOVER OR (KITS) ×4 IMPLANT
LOOP VESSEL MAXI BLUE (MISCELLANEOUS) IMPLANT
LOOP VESSEL MINI RED (MISCELLANEOUS) IMPLANT
NEEDLE ASP BONE MRW 11GX15 J (NEEDLE) ×2 IMPLANT
NEEDLE SPNL 18GX3.5 QUINCKE PK (NEEDLE) ×2 IMPLANT
NS IRRIG 1000ML POUR BTL (IV SOLUTION) ×2 IMPLANT
PACK LAMINECTOMY ORTHO (CUSTOM PROCEDURE TRAY) ×2 IMPLANT
PACK UNIVERSAL I (CUSTOM PROCEDURE TRAY) ×2 IMPLANT
PAD ARMBOARD 7.5X6 YLW CONV (MISCELLANEOUS) ×8 IMPLANT
PEEK SPACER INTERPLAT 35X14X12 (Peek) ×2 IMPLANT
PLATE SPINAL INTERPLT 34X14X12 (Plate) ×1 IMPLANT
PUTTY BONE DBX 5CC MIX (Putty) ×2 IMPLANT
SCREW BONE RESCUE (Screw) ×2 IMPLANT
SCREW BONE STANDARD (Screw) ×4 IMPLANT
SCREW BONE STD (Screw) ×2 IMPLANT
SCREW SPINAL STD (Orthopedic Implant) ×2 IMPLANT
SPONGE INTESTINAL PEANUT (DISPOSABLE) ×10 IMPLANT
SPONGE LAP 18X18 X RAY DECT (DISPOSABLE) IMPLANT
SPONGE LAP 4X18 X RAY DECT (DISPOSABLE) IMPLANT
SPONGE SURGIFOAM ABS GEL 100 (HEMOSTASIS) IMPLANT
STAPLER VISISTAT 35W (STAPLE) IMPLANT
STRIP CLOSURE SKIN 1/2X4 (GAUZE/BANDAGES/DRESSINGS) IMPLANT
SURGIFLO TRUKIT (HEMOSTASIS) IMPLANT
SUT MNCRL AB 3-0 PS2 18 (SUTURE) ×2 IMPLANT
SUT MNCRL AB 4-0 PS2 18 (SUTURE) ×2 IMPLANT
SUT PDS AB 1 CTX 36 (SUTURE) ×2 IMPLANT
SUT PROLENE 4 0 RB 1 (SUTURE) ×8
SUT PROLENE 4-0 RB1 .5 CRCL 36 (SUTURE) ×4 IMPLANT
SUT PROLENE 5 0 C 1 24 (SUTURE) ×2 IMPLANT
SUT PROLENE 5 0 CC1 (SUTURE) IMPLANT
SUT PROLENE 6 0 C 1 30 (SUTURE) ×2 IMPLANT
SUT PROLENE 6 0 CC (SUTURE) IMPLANT
SUT SILK 0 TIES 10X30 (SUTURE) ×2 IMPLANT
SUT SILK 2 0 TIES 10X30 (SUTURE) ×4 IMPLANT
SUT SILK 2 0SH CR/8 30 (SUTURE) IMPLANT
SUT SILK 3 0 TIES 10X30 (SUTURE) ×4 IMPLANT
SUT SILK 3 0SH CR/8 30 (SUTURE) IMPLANT
SUT VIC AB 0 CT1 27 (SUTURE) ×2
SUT VIC AB 0 CT1 27XBRD ANBCTR (SUTURE) ×1 IMPLANT
SUT VIC AB 1 CT1 27 (SUTURE) ×4
SUT VIC AB 1 CT1 27XBRD ANBCTR (SUTURE) ×2 IMPLANT
SUT VIC AB 2-0 CT1 18 (SUTURE) ×4 IMPLANT
SUT VIC AB 2-0 CT1 36 (SUTURE) ×2 IMPLANT
SUT VIC AB 3-0 SH 27 (SUTURE) ×2
SUT VIC AB 3-0 SH 27X BRD (SUTURE) ×1 IMPLANT
SYR BULB IRRIGATION 50ML (SYRINGE) ×2 IMPLANT
TOWEL OR 17X24 6PK STRL BLUE (TOWEL DISPOSABLE) ×4 IMPLANT
TOWEL OR 17X26 10 PK STRL BLUE (TOWEL DISPOSABLE) ×4 IMPLANT
TRAY FOLEY CATH 14FRSI W/METER (CATHETERS) ×2 IMPLANT
WATER STERILE IRR 1000ML POUR (IV SOLUTION) ×2 IMPLANT

## 2012-11-03 NOTE — Brief Op Note (Signed)
11/03/2012  10:57 AM  PATIENT:  Celso Amy  62 y.o. female  PRE-OPERATIVE DIAGNOSIS:  ADJACENT SEGMENTAL DISEASE L5-S1  POST-OPERATIVE DIAGNOSIS:  ADJACENT SEGMENTAL DISEASE L5-S1  PROCEDURE:  Procedure(s): ALIF L5-S1 (N/A) ABDOMINAL EXPOSURE FOR LUMBAR SURGERY (N/A)  SURGEON:  Surgeon(s) and Role: Panel 1:    * Venita Lick, MD - Primary  Panel 2:    * Larina Earthly, MD - Primary  PHYSICIAN ASSISTANT:   ASSISTANTS: Dr Early - approach surgeon   ANESTHESIA:   general  EBL:  Total I/O In: 1600 [I.V.:1600] Out: 400 [Urine:250; Blood:150]  BLOOD ADMINISTERED:none  DRAINS: none   LOCAL MEDICATIONS USED:  NONE  SPECIMEN:  No Specimen  DISPOSITION OF SPECIMEN:  N/A  COUNTS:  YES  TOURNIQUET:  * No tourniquets in log *  DICTATION: .Other Dictation: Dictation Number [id number]  PLAN OF CARE: Admit to inpatient   PATIENT DISPOSITION:  PACU - hemodynamically stable.

## 2012-11-03 NOTE — Transfer of Care (Signed)
Immediate Anesthesia Transfer of Care Note  Patient: Amanda Guerrero  Procedure(s) Performed: Procedure(s): ALIF L5-S1 (N/A) ABDOMINAL EXPOSURE FOR LUMBAR SURGERY (N/A)  Patient Location: PACU  Anesthesia Type:General  Level of Consciousness: awake, alert , oriented and patient cooperative  Airway & Oxygen Therapy: Patient Spontanous Breathing and Patient connected to nasal cannula oxygen  Post-op Assessment: Report given to PACU RN, Post -op Vital signs reviewed and stable and Patient moving all extremities X 4  Post vital signs: Reviewed and stable  Complications: No apparent anesthesia complications

## 2012-11-03 NOTE — Progress Notes (Signed)
SCHEDULED IV ACETAMINOPHEN:  CONVERSION TO ORAL ROUTE to complete the ordered doses.  The Pharmacy and Therapeutics Committee has restricted administration of IV acetaminophen (with a 24 hr maximum duration) to patients who meet both of the following criteria:  Unable to tolerate oral or enteral medication  Contraindication to NSAIDs  Because the patient has taken other oral medications today and has a diet ordered, IV acetaminophen has been converted to PO to complete the course of therapy originally ordered.  If PO acetaminophen should be continued beyond the original stop time, please adjust the order accordingly using the "modify" function.   If you have questions about this conversion, please contact the pharmacy department.  Brigid Re, [facility] 11/03/2012 3:24 PM

## 2012-11-03 NOTE — Anesthesia Postprocedure Evaluation (Signed)
  Anesthesia Post-op Note  Patient: Amanda Guerrero  Procedure(s) Performed: Procedure(s): ALIF L5-S1 (N/A) ABDOMINAL EXPOSURE FOR LUMBAR SURGERY (N/A)  Patient Location: PACU  Anesthesia Type:General  Level of Consciousness: awake and alert   Airway and Oxygen Therapy: Patient Spontanous Breathing and Patient connected to nasal cannula oxygen  Post-op Pain: none  Post-op Assessment: Post-op Vital signs reviewed, Patient's Cardiovascular Status Stable, Respiratory Function Stable, Patent Airway and No signs of Nausea or vomiting  Post-op Vital Signs: Reviewed and stable  Complications: No apparent anesthesia complications

## 2012-11-03 NOTE — H&P (Signed)
No change in clinical exam H+P reviewed  

## 2012-11-03 NOTE — Op Note (Signed)
Amanda Guerrero, Amanda Guerrero                ACCOUNT NO.:  [id number]  MEDICAL RECORD NO.:  [id number]  LOCATION:  [id number]                        FACILITY:  [facility]  PHYSICIAN:  Alvy Beal, MD    DATE OF BIRTH:  1950/09/27  DATE OF PROCEDURE:  11/03/2012 DATE OF DISCHARGE:                              OPERATIVE REPORT   PREOPERATIVE DIAGNOSIS:  Adjacent segment L5-S1 degenerative disk disease.  POSTOPERATIVE DIAGNOSIS:  Adjacent segment L5-S1 degenerative disk disease.  OPERATIVE PROCEDURE:  Anterior lumbar interbody fusion at L5-S1.  APPROACH SURGEON:  Larina Earthly, M.D. and make sure we referred his dictation for details on the approach.  INSTRUMENTATION USED:  A 14 lordotic interbody spacer from the inner plate, packed with chronOS and DBX mix and then RSD anterior lumbar plate appropriately sized fixed with 20 mm screws into L5 x2 and 25 mm screw into S1 x1.  COMPLICATIONS:  None.  CONDITION:  Stable.  HISTORY:  This is a very pleasant woman who had a previous multilevel thoracolumbar fusion down to the L4 by me.  The patient did well initially and then over the last 6-8 months had decreasing debilitating pain.  Despite appropriate nonoperative treatment, she continued to have progressive debilitating back pain.  As a result, we elected to proceed with surgery.  All appropriate risks, benefits, and alternatives were discussed with the patient and consent was obtained.  OPERATIVE NOTE:  The patient was brought to the operating room, placed supine on the operating table.  After successful induction of general anesthesia and endotracheal intubation, TEDs and SCDs and a Foley were inserted.  The patient's rolled towels were placed between underneath the pelvis and the abdomen was prepped and draped in a standard fashion. Dr. Arbie Cookey then scrubbed in.  An appropriate time-out was done confirming patient, procedure, and all other pertinent important data.  I then assisted Dr. Arbie Cookey  in his anterior approach to the lumbar spine.  This is a standard retroperitoneal approach to a transverse incision.  Please refer to his dictation for specifics on the approach.  Once the appropriate retractors were placed, an intraoperative x-ray was taken to confirm that we were at the L5-S1 level.  Once this was done, Dr. Arbie Cookey scrubbed out and I proceeded with the fusion.  Annulotomy was performed with a #10 blade scalpel and then a combination of pituitary rongeurs, curettes, and Kerrison rongeurs to remove the bulk of the disk material.  I then placed a small distractor units into the disk space, so I had to work posteriorly.  Once I had all the disk material out, I was able to use a 2 mm Kerrison to release the anulus from the posterior aspect of the L5 and S1 vertebral bodies.  Once I had released the anulus, I rasped the interbody space with trialing devices and noted that the 14 large trial was the best fit.  I then under live fluoro began distracting the anterior aspect of the disk space confirming that I was not fish mouthing and I had excellent parallel distraction.  Once I confirmed that the posterior release was adequate to allow parallel distraction, I then made sure had bleeding endplates.  I  then aspirated 8 mL of blood from the S1 vertebral body using a trocar.  I then mixed the chronOS and allowed it to sit for 15 minutes.  During this time, I irrigated the wound copiously with normal saline and then packed the PEEK interbody cage with DBX and chronOS.  This was malleted into the appropriate depth.  The anterior lumbar plate was then affixed and malleted into place as well.  I then secured it with 20 mm screws into the body of L5 and 25 mm screw into the body of S1, all screws had excellent purchase.  I then placed a final locking plate and according manufacturer's standard torqued it down so that it locked the screws to prevent backout.  I then irrigated the wound  copiously with normal saline and then sequentially removed the retractors.  There was no significant bleeding.  I then closed the fascia of the rectus with running #1 Prolene and then superficial with 2-0 Vicryl suture and 3-0 Monocryl for the skin.  Steri-Strips and dry dressing were applied.  An intraoperative AP x-ray was taken to confirm that there was no surgical instruments left into the wound.  Once this was confirmed, the patient was then extubated and transferred to PACU without incident.  At the end of the case, all needle and sponge counts were correct.  There was no adverse intraoperative events.     Alvy Beal, MD     DDB/MEDQ  D:  11/03/2012  T:  11/03/2012  Job:  [id number]  cc:   Larina Earthly, M.D.

## 2012-11-03 NOTE — Anesthesia Preprocedure Evaluation (Addendum)
Anesthesia Evaluation  Patient identified by MRN, date of birth, ID band Patient awake    Reviewed: Allergy & Precautions, H&P , NPO status , Patient's Chart, lab work & pertinent test results  Airway Mallampati: II TM Distance: >3 FB Neck ROM: Full    Dental no notable dental hx. (+) Edentulous Upper and Dental Advisory Given   Pulmonary neg pulmonary ROS,  breath sounds clear to auscultation  Pulmonary exam normal       Cardiovascular negative cardio ROS  Rhythm:Regular Rate:Normal     Neuro/Psych PSYCHIATRIC DISORDERS  Neuromuscular disease    GI/Hepatic Neg liver ROS, GERD-  Medicated and Controlled,  Endo/Other  negative endocrine ROS  Renal/GU negative Renal ROS  negative genitourinary   Musculoskeletal   Abdominal   Peds  Hematology negative hematology ROS (+)   Anesthesia Other Findings   Reproductive/Obstetrics negative OB ROS                          Anesthesia Physical Anesthesia Plan  ASA: III  Anesthesia Plan: General   Post-op Pain Management:    Induction: Intravenous  Airway Management Planned: Oral ETT  Additional Equipment:   Intra-op Plan:   Post-operative Plan: Extubation in OR  Informed Consent: I have reviewed the patients History and Physical, chart, labs and discussed the procedure including the risks, benefits and alternatives for the proposed anesthesia with the patient or authorized representative who has indicated his/her understanding and acceptance.   Dental advisory given  Plan Discussed with: CRNA  Anesthesia Plan Comments:        Anesthesia Quick Evaluation

## 2012-11-03 NOTE — Op Note (Signed)
OPERATIVE REPORT  DATE OF SURGERY: 11/03/2012  PATIENT: Amanda Guerrero, 62 y.o. female MRN: [id number]  DOB: [birth year]  PRE-OPERATIVE DIAGNOSIS: L5-S1 degenerative disc disease  POST-OPERATIVE DIAGNOSIS:  Same  PROCEDURE: Anterior exposure for L5-S1 interbody fusion  SURGEON:  Gretta Began, M.D.  Co-surgeon for the exposure: Dr. Shon Baton  ANESTHESIA:  Gen.  EBL: 150 ml  Total I/O In: 1600 [I.V.:1600] Out: 400 [Urine:250; Blood:150]  BLOOD ADMINISTERED: None  DRAINS: None    COUNTS CORRECT:  YES  PLAN OF CARE: PACU   PATIENT DISPOSITION:  PACU - hemodynamically stable  PROCEDURE DETAILS: The patient was taken to the operating room placed supine position where the area of the abdomen prepped and draped in usual sterile fashion. Lateral C-arm projection revealed the level of the L5-S1 disc. An incision was made beginning in the midline several fingerbreadths below the umbilicus and extended laterally. The subcutaneous fat was opened in line with the skin incision all with electrocautery. The fat was mobilized off the anterior fascia. The rectus muscle was mobilized circumferentially. The retroperitoneal space was entered bluntly below the level of the semilunar line. Peritoneal sac was left intact. This was mobilized towards the right. The L5-S1 disc was identified and the iliac vessels were bluntly dissected free. Middle sacral vessels were divided between ligaclips for better mobilization. The left ureter was identified and was reflected to the right. All the [facility] retractor was brought onto the field and the reverse lip 150 blades were positioned to the right and left of the L5-S1 disc. Malleable retractors were used first. Inferior exposure. C-arm was brought back onto the field to confirm that this was the L5-S1 disc. The remainder of the procedure will be dictated as a separate note by Dr. Serina Cowper, M.D. 11/03/2012 12:16 PM

## 2012-11-03 NOTE — Progress Notes (Signed)
Utilization review completed.  

## 2012-11-04 ENCOUNTER — Encounter (HOSPITAL_COMMUNITY): Payer: Self-pay | Admitting: Orthopedic Surgery

## 2012-11-04 ENCOUNTER — Inpatient Hospital Stay (HOSPITAL_COMMUNITY): Payer: Medicare Other

## 2012-11-04 DIAGNOSIS — Z09 Encounter for follow-up examination after completed treatment for conditions other than malignant neoplasm: Secondary | ICD-10-CM

## 2012-11-04 MED FILL — Sodium Chloride IV Soln 0.9%: INTRAVENOUS | Qty: 1000 | Status: AC

## 2012-11-04 MED FILL — Heparin Sodium (Porcine) Inj 1000 Unit/ML: INTRAMUSCULAR | Qty: 30 | Status: AC

## 2012-11-04 NOTE — Progress Notes (Signed)
VASCULAR LAB PRELIMINARY  PRELIMINARY  PRELIMINARY  PRELIMINARY  Bilateral lower extremity venous duplex  completed.    Preliminary report:  Bilateral:  No evidence of DVT, superficial thrombosis, or Baker's Cyst.    Julina Altmann, RVT 11/04/2012, 4:26 PM

## 2012-11-04 NOTE — Progress Notes (Signed)
    Subjective: Procedure(s) (LRB): ALIF L5-S1 (N/A) ABDOMINAL EXPOSURE FOR LUMBAR SURGERY (N/A) 1 Day Post-Op  Patient reports pain as 2 on 0-10 scale and moderate.  Reports decreased leg pain reports incisional back pain   Positive void Negative bowel movement Positive flatus Negative chest pain or shortness of breath  Objective: Vital signs in last 24 hours: Temp:  [97.3 F (36.3 C)-98.8 F (37.1 C)] 98.8 F (37.1 C) (08/14 0659) Pulse Rate:  [62-90] 90 (08/14 0659) Resp:  [9-16] 16 (08/14 0659) BP: (98-122)/(55-68) 122/65 mmHg (08/14 0659) SpO2:  [95 %-99 %] 95 % (08/14 0659)  Intake/Output from previous day: 08/13 0701 - 08/14 0700 In: 2915 [P.O.:720; I.V.:2195] Out: 2775 [Urine:2625; Blood:150]  Labs:  Recent Labs  11/01/12 1548  WBC 5.4  RBC 4.28  HCT 38.3  PLT 252    Recent Labs  11/01/12 1548  NA 140  K 3.6  CL 101  CO2 28  BUN 10  CREATININE 0.61  GLUCOSE 83  CALCIUM 9.9   No results found for this basename: LABPT, INR,  in the last 72 hours  Physical Exam: Neurologically intact ABD soft Neurovascular intact Incision: dressing C/D/I and no drainage Compartment soft  Assessment/Plan: Patient stable  xrays pending  Continue mobilization with physical therapy Continue care  Advance diet Up with therapy D/C IV fluids Plan for discharge tomorrow  Venita Lick, MD [facility] [phone]

## 2012-11-04 NOTE — Progress Notes (Signed)
Morphine PCA dc'd.

## 2012-11-04 NOTE — Progress Notes (Signed)
Patient ID: INETA SINNING, female   DOB: 1950/04/22, 62 y.o.   MRN: [id number]  postop day #1 Comfortable up in chair. He reports mild to moderate discomfort when moving. Abdomen soft nontender. Dressings intact 2+ left dorsalis pedis pulse Stable postop day 1. Will not follow actively. Please call we can assist.

## 2012-11-04 NOTE — Evaluation (Signed)
Occupational Therapy Evaluation Patient Details Name: Amanda Guerrero MRN: [id number] DOB: 05/24/1950 Today's Date: 11/04/2012 Time: 4098-1191 OT Time Calculation (min): 13 min  OT Assessment / Plan / Recommendation History of present illness Pt. admitted with L5-S1 DDD and underwent ALIF of same   Clinical Impression   Pt admitted with above.  Pt is making excellent progress with ADL performance and functional mobility. Education completed and pt has no further acute OT needs. Will sign off.    OT Assessment  Patient does not need any further OT services    Follow Up Recommendations  No OT follow up    Barriers to Discharge      Equipment Recommendations  None recommended by OT    Recommendations for Other Services    Frequency       Precautions / Restrictions Precautions Precautions: Back Precaution Booklet Issued: Yes (comment) Precaution Comments: Reviewed 3/3 back precautions. Required Braces or Orthoses: Spinal Brace Spinal Brace: Lumbar corset Restrictions Weight Bearing Restrictions: No   Pertinent Vitals/Pain See vitals    ADL  Eating/Feeding: Performed;Independent Where Assessed - Eating/Feeding: Chair Grooming: Performed;Brushing hair;Independent Where Assessed - Grooming: Unsupported standing Upper Body Bathing: Simulated;Modified independent Where Assessed - Upper Body Bathing: Unsupported sitting Lower Body Bathing: Simulated;Modified independent Where Assessed - Lower Body Bathing: Unsupported sit to stand Upper Body Dressing: Performed;Modified independent Where Assessed - Upper Body Dressing: Unsupported sitting Lower Body Dressing: Performed;Modified independent Where Assessed - Lower Body Dressing: Unsupported sit to stand Toilet Transfer: Simulated;Modified independent Toilet Transfer Method:  (ambulating) Acupuncturist:  (chair) Equipment Used: Rolling walker Transfers/Ambulation Related to ADLs: mod I with RW ADL Comments: Pt  ambulating in hall back to room on OT arrival.  Reviewed back precautions with pt and educated pt on techniques to maintain precautions during ADLs.  Pt states she has AE from past hip sx and was able to correctly verbalize how to use AE for LB ADLs.      OT Diagnosis:    OT Problem List:   OT Treatment Interventions:     OT Goals(Current goals can be found in the care plan section) Acute Rehab OT Goals Patient Stated Goal: return to her prior independent level  Visit Information  Last OT Received On: 11/04/12 Assistance Needed: +1 History of Present Illness: Pt. admitted with L5-S1 DDD and underwent ALIF of same       Prior Functioning     Home Living Family/patient expects to be discharged to:: Private residence Living Arrangements: Children Available Help at Discharge: Family;Available 24 hours/day Type of Home: Mobile home Home Access: Stairs to enter Entrance Stairs-Number of Steps: 4 Entrance Stairs-Rails: Right;Left;Can reach both Home Layout: One level Home Equipment: Walker - 2 wheels;Bedside commode;Shower seat;Adaptive equipment Adaptive Equipment: Sock aid;Reacher Prior Function Level of Independence: Independent Communication Communication: No difficulties         Vision/Perception     Cognition  Cognition Arousal/Alertness: Awake/alert Behavior During Therapy: WFL for tasks assessed/performed Overall Cognitive Status: Within Functional Limits for tasks assessed    Extremity/Trunk Assessment Upper Extremity Assessment Upper Extremity Assessment: Overall WFL for tasks assessed Lower Extremity Assessment Lower Extremity Assessment: Overall WFL for tasks assessed Cervical / Trunk Assessment Cervical / Trunk Assessment: Normal     Mobility Bed Mobility Bed Mobility: Not assessed  Transfers Transfers: Sit to Stand;Stand to Sit Sit to Stand: 6: Modified independent (Device/Increase time);From chair/3-in-1;With upper extremity assist Stand to Sit:  6: Modified independent (Device/Increase time);To chair/3-in-1;With upper extremity assist Details for  Transfer Assistance: cues for erect spine and correct/safe technique     Exercise     Balance Balance Balance Assessed: Yes Dynamic Standing Balance Dynamic Standing - Balance Support: No upper extremity supported;During functional activity Dynamic Standing - Level of Assistance: 5: Stand by assistance   End of Session OT - End of Session Equipment Utilized During Treatment: Back brace;Rolling walker Activity Tolerance: Patient tolerated treatment well Patient left: in chair;with call bell/phone within reach;with family/visitor present Nurse Communication: Mobility status  GO   11/04/2012 Cipriano Mile OTR/L Pager [phone] Office [phone]   Cipriano Mile 11/04/2012, 4:21 PM

## 2012-11-04 NOTE — Evaluation (Signed)
Physical Therapy Evaluation Patient Details Name: Amanda Guerrero MRN: [id number] DOB: 31-Jan-1951 Today's Date: 11/04/2012 Time: 6295-2841 PT Time Calculation (min): 32 min  PT Assessment / Plan / Recommendation History of Present Illness  Pt. admitted with L5-S1 DDD and underwent ALIF of same  Clinical Impression  Pt. Presented to Pt already up in chair.  She has post op decrease in her independence level and needs another session of PT to make sure she is mobilizing safely and satisfactorily for upcoming DC home.  She will have 24 hour supervision from her sons and education has been initiated.  She is making EXCELLENT PROGRESS.      PT Assessment  Patient needs continued PT services    Follow Up Recommendations  No PT follow up;Supervision/Assistance - 24 hour    Does the patient have the potential to tolerate intense rehabilitation      Barriers to Discharge        Equipment Recommendations  None recommended by PT    Recommendations for Other Services     Frequency Min 6X/week    Precautions / Restrictions Precautions Precautions: Back Precaution Booklet Issued: Yes (comment) Precaution Comments: instructed pt in back precautions and log rolling technique and provided handout Required Braces or Orthoses: Spinal Brace Spinal Brace: Lumbar corset Restrictions Weight Bearing Restrictions: No   Pertinent Vitals/Pain See vitals tab        Mobility  Bed Mobility Bed Mobility: Sit to Supine;Supine to Sit;Sitting - Scoot to [place] of Bed;Rolling Left;Left Sidelying to Sit (pt. already up in chair) Rolling Left: 6: Modified independent (Device/Increase time) Left Sidelying to Sit: 6: Modified independent (Device/Increase time) Supine to Sit: 6: Modified independent (Device/Increase time) Sitting - Scoot to Edge of Bed: 6: Modified independent (Device/Increase time) Sit to Supine: 6: Modified independent (Device/Increase time) Details for Bed Mobility Assistance: cues for  back precautions and technique but no physical assist needed Transfers Transfers: Sit to Stand;Stand to Sit Sit to Stand: 5: Supervision;With upper extremity assist;From bed;From chair/3-in-1;With armrests Stand to Sit: 5: Supervision;To bed;To chair/3-in-1;With upper extremity assist;With armrests Details for Transfer Assistance: cues for erect spine and correct/safe technique Ambulation/Gait Ambulation/Gait Assistance: 5: Supervision Ambulation Distance (Feet): 500 Feet Assistive device: Rolling walker Ambulation/Gait Assistance Details: pt. managing gait with RW well with no overt LOB and supervision for safety.  son will help her in hallway again later today. Gait Pattern: Step-through pattern;Within Functional Limits Stairs: Yes Stair Management Technique: Two rails;Step to pattern;Forwards Number of Stairs: 5    Exercises     PT Diagnosis: Difficulty walking;Acute pain  PT Problem List: Decreased mobility;Decreased knowledge of use of DME;Decreased knowledge of precautions;Pain PT Treatment Interventions: DME instruction;Gait training;Stair training;Functional mobility training;Therapeutic activities;Patient/family education     PT Goals(Current goals can be found in the care plan section) Acute Rehab PT Goals Patient Stated Goal: return to her prior independent level PT Goal Formulation: With patient Time For Goal Achievement: 11/05/12 Potential to Achieve Goals: Good  Visit Information  Last PT Received On: 11/04/12 Assistance Needed: +1 History of Present Illness: Pt. admitted with L5-S1 DDD and underwent ALIF of same       Prior Functioning  Home Living Family/patient expects to be discharged to:: Private residence Living Arrangements: Children Available Help at Discharge: Family;Available 24 hours/day Type of Home: Mobile home Home Access: Stairs to enter Entrance Stairs-Number of Steps: 4 Entrance Stairs-Rails: Right;Left;Can reach both Home Layout: One  level Home Equipment: Walker - 2 wheels;Bedside commode;Wheelchair - manual Prior Function  Level of Independence: Independent Communication Communication: No difficulties    Cognition  Cognition Arousal/Alertness: Awake/alert Behavior During Therapy: WFL for tasks assessed/performed Overall Cognitive Status: Within Functional Limits for tasks assessed    Extremity/Trunk Assessment Upper Extremity Assessment Upper Extremity Assessment: Overall WFL for tasks assessed Lower Extremity Assessment Lower Extremity Assessment: Overall WFL for tasks assessed Cervical / Trunk Assessment Cervical / Trunk Assessment: Normal   Balance Balance Balance Assessed: Yes Dynamic Standing Balance Dynamic Standing - Balance Support: No upper extremity supported;During functional activity Dynamic Standing - Level of Assistance: 5: Stand by assistance  End of Session PT - End of Session Equipment Utilized During Treatment: Gait belt;Back brace Activity Tolerance: Patient tolerated treatment well Patient left: in chair;with call bell/phone within reach;with family/visitor present Nurse Communication: Mobility status;Precautions  GP     Ferman Hamming 11/04/2012, 2:21 PM Weldon Picking PT Acute Rehab Services [phone] Beeper [phone]

## 2012-11-05 MED ORDER — POLYETHYLENE GLYCOL 3350 17 GM/SCOOP PO POWD: 17.0000 g | Freq: Every day | ORAL | Status: DC

## 2012-11-05 MED ORDER — ENOXAPARIN SODIUM 30 MG/0.3ML ~~LOC~~ SOLN: 30.0000 mg | Freq: Two times a day (BID) | SUBCUTANEOUS | Status: DC

## 2012-11-05 MED ORDER — METHOCARBAMOL 500 MG PO TABS: 500.0000 mg | ORAL_TABLET | Freq: Three times a day (TID) | ORAL | Status: DC | PRN

## 2012-11-05 NOTE — Progress Notes (Signed)
SW received a consult for possible placement. PT is recommending no f/u. Clinical Social Worker will sign off for now as social work intervention is no longer needed. Please consult [place] again if new need arises.   Sabino Niemann, MSW [phone]

## 2012-11-05 NOTE — Discharge Summary (Signed)
Patient ID: Amanda Guerrero MRN: [id number] DOB/AGE: [birth year] 62 y.o.  Admit date: 11/03/2012 Discharge date: 11/05/2012  Admission Diagnoses:  Active Problems:   * No active hospital problems. *   Discharge Diagnoses:  Active Problems:   * No active hospital problems. *  status post Procedure(s): ALIF L5-S1 ABDOMINAL EXPOSURE FOR LUMBAR SURGERY  Past Medical History  Diagnosis Date  . 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 Peripheral edema     takes Furosemide daily  . History of migraine     last one about 6months ago  . Weakness     and numbness both hands  . Peripheral neuropathy     takes Lyrica bid  . Arthritis   . Joint pain   . Chronic back pain   . Bruises easily   . GERD (gastroesophageal reflux disease)     takes Nexium bid  . Constipation     r/t pain meds;takes OTC stool softener  . History of colon polyps   . Urinary frequency   . Urinary urgency   . History of blood transfusion     no abnormal reaction noted  . Depression     takes Celexa daily  . Anxiety     takes Klonopin tid  . Insomnia     takes Lunesta nightly  . History of MRSA infection 2009  . History of staph infection 2009    Surgeries: Procedure(s): ALIF L5-S1 ABDOMINAL EXPOSURE FOR LUMBAR SURGERY on 11/03/2012   Consultants:    Discharged Condition: Improved  Hospital Course: Amanda Guerrero is an 62 y.o. female who was admitted 11/03/2012 for operative treatment of <principal problem not specified>. Patient failed conservative treatments (please see the history and physical for the specifics) and had severe unremitting pain that affects sleep, daily activities and work/hobbies. After pre-op clearance, the patient was taken to the operating room on 11/03/2012 and underwent  Procedure(s): ALIF L5-S1 ABDOMINAL EXPOSURE FOR LUMBAR SURGERY.    Patient was given perioperative antibiotics: Anti-infectives   Start     Dose/Rate Route Frequency Ordered Stop   11/03/12 1700  ceFAZolin  (ANCEF) IVPB 1 g/50 mL premix     1 g 100 mL/hr over 30 Minutes Intravenous Every 8 hours 11/03/12 1300 11/04/12 0048   11/02/12 1435  ceFAZolin (ANCEF) IVPB 2 g/50 mL premix     2 g 100 mL/hr over 30 Minutes Intravenous 30 min pre-op 11/02/12 1435 11/03/12 0845       Patient was given sequential compression devices and early ambulation to prevent DVT.   Patient benefited maximally from hospital stay and there were no complications. At the time of discharge, the patient was urinating/moving their bowels without difficulty, tolerating a regular diet, pain is controlled with oral pain medications and they have been cleared by PT/OT.   Recent vital signs: Patient Vitals for the past 24 hrs:  BP Temp Temp src Pulse Resp SpO2  11/05/12 0528 125/63 mmHg 97.8 F (36.6 C) Oral 56 16 94 %  11/04/12 2117 116/64 mmHg 98.8 F (37.1 C) Oral 51 16 94 %  11/04/12 1530 118/67 mmHg 98.2 F (36.8 C) - 57 16 96 %     Recent laboratory studies: No results found for this basename: WBC, HGB, HCT, PLT, NA, K, CL, CO2, BUN, CREATININE, GLUCOSE, PT, INR, CALCIUM, 2,  in the last 72 hours   Discharge Medications:     Medication List  citalopram 40 MG tablet  Commonly known as:  CELEXA  Take 40 mg by mouth daily.     clonazePAM 0.5 MG tablet  Commonly known as:  KLONOPIN  Take 0.5 mg by mouth 3 (three) times daily as needed for anxiety.     enoxaparin 30 MG/0.3ML injection  Commonly known as:  LOVENOX  Inject 0.3 mL (30 mg total) into the skin every 12 (twelve) hours.     esomeprazole 40 MG capsule  Commonly known as:  NEXIUM  Take 40 mg by mouth 2 (two) times daily.     eszopiclone 2 MG Tabs tablet  Commonly known as:  LUNESTA  Take 2 mg by mouth at bedtime. Take immediately before bedtime     furosemide 40 MG tablet  Commonly known as:  LASIX  Take 40 mg by mouth daily.     methocarbamol 500 MG tablet  Commonly known as:  ROBAXIN  Take 1 tablet (500 mg total) by mouth 3 (three)  times daily as needed.     morphine 30 MG 12 hr tablet  Commonly known as:  MS CONTIN  Take 30 mg by mouth 2 (two) times daily.     oxyCODONE-acetaminophen 10-325 MG per tablet  Commonly known as:  PERCOCET  Take 1 tablet by mouth 2 (two) times daily.     polyethylene glycol powder powder  Commonly known as:  GLYCOLAX  Take 17 g by mouth daily.     pregabalin 75 MG capsule  Commonly known as:  LYRICA  Take 75 mg by mouth 3 (three) times daily.     vitamin C 1000 MG tablet  Take 1,000 mg by mouth daily.        Diagnostic Studies: Dg Lumbar Spine 2-3 Views  11/04/2012   *RADIOLOGY REPORT*  Clinical Data: Lumbar fusion  LUMBAR SPINE - 2-3 VIEW  Comparison:   the previous day's studyand earlier studies  Findings: Stable bilateral pedicle screws at all levels from T10- L4, and on the left at L5, with vertical interconnecting hardware, intact without surrounding lucency.  Changes of anterior interbody fusion with hardware and interbody cages L5-S1.  Alignment is preserved.  Negative for fracture.  IMPRESSION:  1.  Stable anterior interbody fusion hardware L5-S1, posterior fusion hardware T10-L5.   Original Report Authenticated By: D. Andria Rhein, MD   Dg Lumbar Spine 2-3 Views  11/03/2012   *RADIOLOGY REPORT*  Clinical Data: L5-S1 anterior discectomy and fusion  LUMBAR SPINE - 2-3 VIEW  Comparison: 08/11  Findings: Two C-arm images show anterior discectomy and fusion at the L5-S1 level.  Interbody fusion material appears grossly well positioned.  Anterior plate and screws appear well positioned.  No operative complication evident.  IMPRESSION: Good appearance following anterior fusion L5-S1   Original Report Authenticated By: Paulina Fusi, M.D.   Dg Lumbar Spine 2-3 Views  11/01/2012   *RADIOLOGY REPORT*  Clinical Data: Preop for effusion.  LUMBAR SPINE - 2-3 VIEW  Comparison: Mild gram and post myelogram CT, 09/30/2012  Findings: Bilateral pedicle screws extend from T10 through the lower  lumbar spine, which appear well seated with no evidence of loosening.  The pedicle screws on the right extend to the L4 where as those on the left extend to L5.  There are interconnecting rods bilaterally, and transverse connecting brackets at the thoracolumbar junction and mid lumbar spine.  There is a grade 1 anterolisthesis of L5 on S1, stable.  There is no other spondylolisthesis.  There is no fracture.  There is significant loss of disc height throughout the lumbar spine which is stable.  The bones are diffusely demineralized.  The soft tissues are unremarkable.  Bilateral laminectomies have been performed at L2, L3 and L4.  IMPRESSION: Changes from a previous extensive lower thoracic to lumbar spine fusion as detailed.  No evidence of loosening or malalignment of the orthopedic hardware.  Degenerative changes.  The findings are stable from the prior myelogram of post-myelogram CT.   Original Report Authenticated By: Amie Portland, M.D.   Dg Or Local Abdomen  11/03/2012   *RADIOLOGY REPORT*  Clinical Data: Instrument count.  L5-S1 anterior discectomy and fusion.  OR LOCAL ABDOMEN  Comparison: 11/01/2012  Findings: I believe the film is left- right  mismarked.  This exam covers the region from L2 through the pelvis.  This single image demonstrates interval discectomy and fusion at L5-S1, presumably from a left-sided approach.  There is an interbody marker. Anterior plate and screws appear as expected.  There is a singular vascular clip projected over the sacrum.  I do not see anything that looks like an unexpected retained foreign object.  IMPRESSION: Anterior discectomy and fusion L5-S1.  No evidence of unexpected retained foreign object.  This film appears to be right left mismarked.  Result was called to the operating room by myself.   Original Report Authenticated By: Paulina Fusi, M.D.          Follow-up Information   Schedule an appointment as soon as possible for a visit with Alvy Beal, MD.    Specialty:  Orthopedic Surgery   Contact information:   [address] [place] [address] [phone]       Discharge Plan:  discharge to home  Disposition: stable    Signed: Venita Lick D for Dr. Venita Lick [facility] [phone] 11/05/2012, 7:49 AM

## 2012-11-05 NOTE — Progress Notes (Signed)
    Subjective: Procedure(s) (LRB): ALIF L5-S1 (N/A) ABDOMINAL EXPOSURE FOR LUMBAR SURGERY (N/A) 2 Days Post-Op  Patient reports pain as 2 on 0-10 scale.  Reports decreased leg pain reports incisional back pain   Positive void Negative bowel movement Positive flatus Negative chest pain or shortness of breath  Objective: Vital signs in last 24 hours: Temp:  [97.8 F (36.6 C)-98.8 F (37.1 C)] 97.8 F (36.6 C) (08/15 0528) Pulse Rate:  [51-57] 56 (08/15 0528) Resp:  [16] 16 (08/15 0528) BP: (116-125)/(63-67) 125/63 mmHg (08/15 0528) SpO2:  [94 %-96 %] 94 % (08/15 0528)  Intake/Output from previous day: 08/14 0701 - 08/15 0700 In: 960 [P.O.:960] Out: -   Labs: No results found for this basename: WBC, RBC, HCT, PLT,  in the last 72 hours No results found for this basename: NA, K, CL, CO2, BUN, CREATININE, GLUCOSE, CALCIUM,  in the last 72 hours No results found for this basename: LABPT, INR,  in the last 72 hours  Physical Exam: Neurologically intact ABD soft Neurovascular intact Intact pulses distally Incision: dressing C/D/I and no drainage Compartment soft  Assessment/Plan: Patient stable  xrays satisfactory Continue mobilization with physical therapy Continue care  Advance diet Up with therapy D/C IV fluids D/c to home  Venita Lick, MD [facility] [phone]

## 2012-11-05 NOTE — Progress Notes (Signed)
Physical Therapy Treatment Patient Details Name: Amanda Guerrero MRN: [id number] DOB: 07-Jan-1951 Today's Date: 11/05/2012 Time: 4098-1191 PT Time Calculation (min): 18 min  PT Assessment / Plan / Recommendation  History of Present Illness Pt. admitted with L5-S1 DDD and underwent ALIF of same   PT Comments   Pt. Has made excellent progress with her mobility and anticipates DC this am.  All education completed and pt. Will have 24 hour assist upon DC.  Pt reminded to limit her activity as directed by Dr. Shon Baton.  She asked when she can begin to drive.  I advised pt. That she must wait for clearance from Dr. Shon Baton.  She verbalizes understanding and agreement.  Follow Up Recommendations  No PT follow up;Supervision/Assistance - 24 hour     Does the patient have the potential to tolerate intense rehabilitation     Barriers to Discharge        Equipment Recommendations  None recommended by PT    Recommendations for Other Services    Frequency Min 6X/week   Progress towards PT Goals Progress towards PT goals: Progressing toward goals  Plan Current plan remains appropriate    Precautions / Restrictions Precautions Precautions: Back Precaution Comments: pt. able to state 3/3 back precautions but needed reminder to generalize not to twist during mobility Required Braces or Orthoses: Spinal Brace Spinal Brace: Lumbar corset Restrictions Weight Bearing Restrictions: No   Pertinent Vitals/Pain See vitals tab     Mobility  Bed Mobility Bed Mobility: Not assessed (pt. in chair) Transfers Transfers: Sit to Stand;Stand to Sit Sit to Stand: 6: Modified independent (Device/Increase time);From chair/3-in-1;With upper extremity assist Stand to Sit: 6: Modified independent (Device/Increase time);To chair/3-in-1;With upper extremity assist Details for Transfer Assistance: no cueing needed; pt. used correct technique Ambulation/Gait Ambulation/Gait Assistance: 6: Modified independent  (Device/Increase time) Ambulation Distance (Feet): 500 Feet Assistive device: Rolling walker Ambulation/Gait Assistance Details: no overt LOB noted and pt. at mod I level with use of RW Gait Pattern: Step-through pattern;Within Functional Limits Stairs: Yes Stairs Assistance: 5: Supervision Stairs Assistance Details (indicate cue type and reason): initial reminder for correct sequence, pt. then able to demo correct technique Stair Management Technique: Two rails;Alternating pattern;Forwards Number of Stairs: 5    Exercises     PT Diagnosis:    PT Problem List:   PT Treatment Interventions:     PT Goals (current goals can now be found in the care plan section) Acute Rehab PT Goals Patient Stated Goal: return to her prior independent level  Visit Information  Last PT Received On: 11/05/12 History of Present Illness: Pt. admitted with L5-S1 DDD and underwent ALIF of same    Subjective Data  Subjective: Pt. reports she and her son have walked several laps around nursing unit since yesterday with no problems Patient Stated Goal: return to her prior independent level   Cognition  Cognition Arousal/Alertness: Awake/alert Behavior During Therapy: WFL for tasks assessed/performed Overall Cognitive Status: Within Functional Limits for tasks assessed    Balance     End of Session PT - End of Session Equipment Utilized During Treatment: Gait belt;Back brace Activity Tolerance: Patient tolerated treatment well Patient left: in chair;with call bell/phone within reach;with family/visitor present Nurse Communication: Mobility status;Precautions   GP     Ferman Hamming 11/05/2012, 9:21 AM Wel[place] Picking PT Acute Rehab Services [phone] [place] [phone]

## 2012-11-05 NOTE — Progress Notes (Signed)
Patient to be d/c to home with family.  No equipment needed, d/c instructions reviewed and prescriptions given to family.  Reviewed Lovenox injection therapy with patient and family.

## 2012-11-26 DIAGNOSIS — M5137 Other intervertebral disc degeneration, lumbosacral region: Secondary | ICD-10-CM | POA: Diagnosis not present

## 2012-11-29 DIAGNOSIS — M810 Age-related osteoporosis without current pathological fracture: Secondary | ICD-10-CM | POA: Diagnosis not present

## 2012-11-29 DIAGNOSIS — M159 Polyosteoarthritis, unspecified: Secondary | ICD-10-CM | POA: Diagnosis not present

## 2012-11-29 DIAGNOSIS — K219 Gastro-esophageal reflux disease without esophagitis: Secondary | ICD-10-CM | POA: Diagnosis not present

## 2012-11-29 DIAGNOSIS — F411 Generalized anxiety disorder: Secondary | ICD-10-CM | POA: Diagnosis not present

## 2012-12-21 DIAGNOSIS — M1710 Unilateral primary osteoarthritis, unspecified knee: Secondary | ICD-10-CM | POA: Diagnosis not present

## 2012-12-21 DIAGNOSIS — Z981 Arthrodesis status: Secondary | ICD-10-CM | POA: Diagnosis not present

## 2012-12-27 DIAGNOSIS — IMO0002 Reserved for concepts with insufficient information to code with codable children: Secondary | ICD-10-CM | POA: Diagnosis not present

## 2012-12-27 DIAGNOSIS — Z23 Encounter for immunization: Secondary | ICD-10-CM | POA: Diagnosis not present

## 2012-12-27 DIAGNOSIS — K219 Gastro-esophageal reflux disease without esophagitis: Secondary | ICD-10-CM | POA: Diagnosis not present

## 2012-12-27 DIAGNOSIS — M159 Polyosteoarthritis, unspecified: Secondary | ICD-10-CM | POA: Diagnosis not present

## 2012-12-27 DIAGNOSIS — M810 Age-related osteoporosis without current pathological fracture: Secondary | ICD-10-CM | POA: Diagnosis not present

## 2013-01-24 DIAGNOSIS — J209 Acute bronchitis, unspecified: Secondary | ICD-10-CM | POA: Diagnosis not present

## 2013-01-24 DIAGNOSIS — IMO0002 Reserved for concepts with insufficient information to code with codable children: Secondary | ICD-10-CM | POA: Diagnosis not present

## 2013-01-24 DIAGNOSIS — M810 Age-related osteoporosis without current pathological fracture: Secondary | ICD-10-CM | POA: Diagnosis not present

## 2013-01-24 DIAGNOSIS — M159 Polyosteoarthritis, unspecified: Secondary | ICD-10-CM | POA: Diagnosis not present

## 2013-01-28 DIAGNOSIS — M1710 Unilateral primary osteoarthritis, unspecified knee: Secondary | ICD-10-CM | POA: Diagnosis not present

## 2013-01-28 DIAGNOSIS — M25569 Pain in unspecified knee: Secondary | ICD-10-CM | POA: Diagnosis not present

## 2013-02-07 DIAGNOSIS — M1710 Unilateral primary osteoarthritis, unspecified knee: Secondary | ICD-10-CM | POA: Diagnosis not present

## 2013-02-07 DIAGNOSIS — M25569 Pain in unspecified knee: Secondary | ICD-10-CM | POA: Diagnosis not present

## 2013-02-14 DIAGNOSIS — M1710 Unilateral primary osteoarthritis, unspecified knee: Secondary | ICD-10-CM | POA: Diagnosis not present

## 2013-02-14 DIAGNOSIS — M25569 Pain in unspecified knee: Secondary | ICD-10-CM | POA: Diagnosis not present

## 2013-02-22 DIAGNOSIS — K219 Gastro-esophageal reflux disease without esophagitis: Secondary | ICD-10-CM | POA: Diagnosis not present

## 2013-02-22 DIAGNOSIS — M810 Age-related osteoporosis without current pathological fracture: Secondary | ICD-10-CM | POA: Diagnosis not present

## 2013-02-22 DIAGNOSIS — Z6825 Body mass index (BMI) 25.0-25.9, adult: Secondary | ICD-10-CM | POA: Diagnosis not present

## 2013-02-22 DIAGNOSIS — M159 Polyosteoarthritis, unspecified: Secondary | ICD-10-CM | POA: Diagnosis not present

## 2013-02-24 DIAGNOSIS — M25569 Pain in unspecified knee: Secondary | ICD-10-CM | POA: Diagnosis not present

## 2013-02-28 DIAGNOSIS — M25569 Pain in unspecified knee: Secondary | ICD-10-CM | POA: Diagnosis not present

## 2013-03-22 DIAGNOSIS — F411 Generalized anxiety disorder: Secondary | ICD-10-CM | POA: Diagnosis not present

## 2013-03-22 DIAGNOSIS — K219 Gastro-esophageal reflux disease without esophagitis: Secondary | ICD-10-CM | POA: Diagnosis not present

## 2013-03-22 DIAGNOSIS — M159 Polyosteoarthritis, unspecified: Secondary | ICD-10-CM | POA: Diagnosis not present

## 2013-03-22 DIAGNOSIS — M810 Age-related osteoporosis without current pathological fracture: Secondary | ICD-10-CM | POA: Diagnosis not present

## 2013-03-29 DIAGNOSIS — M25569 Pain in unspecified knee: Secondary | ICD-10-CM | POA: Diagnosis not present

## 2013-04-19 DIAGNOSIS — F411 Generalized anxiety disorder: Secondary | ICD-10-CM | POA: Diagnosis not present

## 2013-04-19 DIAGNOSIS — M159 Polyosteoarthritis, unspecified: Secondary | ICD-10-CM | POA: Diagnosis not present

## 2013-04-19 DIAGNOSIS — M810 Age-related osteoporosis without current pathological fracture: Secondary | ICD-10-CM | POA: Diagnosis not present

## 2013-04-19 DIAGNOSIS — K219 Gastro-esophageal reflux disease without esophagitis: Secondary | ICD-10-CM | POA: Diagnosis not present

## 2013-05-17 DIAGNOSIS — R609 Edema, unspecified: Secondary | ICD-10-CM | POA: Diagnosis not present

## 2013-05-17 DIAGNOSIS — IMO0002 Reserved for concepts with insufficient information to code with codable children: Secondary | ICD-10-CM | POA: Diagnosis not present

## 2013-05-17 DIAGNOSIS — K219 Gastro-esophageal reflux disease without esophagitis: Secondary | ICD-10-CM | POA: Diagnosis not present

## 2013-05-17 DIAGNOSIS — G4700 Insomnia, unspecified: Secondary | ICD-10-CM | POA: Diagnosis not present

## 2013-05-17 DIAGNOSIS — M810 Age-related osteoporosis without current pathological fracture: Secondary | ICD-10-CM | POA: Diagnosis not present

## 2013-05-23 DIAGNOSIS — M25569 Pain in unspecified knee: Secondary | ICD-10-CM | POA: Diagnosis not present

## 2013-05-23 DIAGNOSIS — M961 Postlaminectomy syndrome, not elsewhere classified: Secondary | ICD-10-CM | POA: Diagnosis not present

## 2013-05-23 DIAGNOSIS — G894 Chronic pain syndrome: Secondary | ICD-10-CM | POA: Diagnosis not present

## 2013-05-23 DIAGNOSIS — M5137 Other intervertebral disc degeneration, lumbosacral region: Secondary | ICD-10-CM | POA: Diagnosis not present

## 2013-05-26 DIAGNOSIS — M25569 Pain in unspecified knee: Secondary | ICD-10-CM | POA: Diagnosis not present

## 2013-06-01 DIAGNOSIS — IMO0002 Reserved for concepts with insufficient information to code with codable children: Secondary | ICD-10-CM | POA: Diagnosis not present

## 2013-06-01 DIAGNOSIS — M25569 Pain in unspecified knee: Secondary | ICD-10-CM | POA: Diagnosis not present

## 2013-06-03 DIAGNOSIS — G609 Hereditary and idiopathic neuropathy, unspecified: Secondary | ICD-10-CM | POA: Diagnosis not present

## 2013-06-03 DIAGNOSIS — Z01810 Encounter for preprocedural cardiovascular examination: Secondary | ICD-10-CM | POA: Diagnosis not present

## 2013-06-03 DIAGNOSIS — M810 Age-related osteoporosis without current pathological fracture: Secondary | ICD-10-CM | POA: Diagnosis not present

## 2013-06-03 DIAGNOSIS — M159 Polyosteoarthritis, unspecified: Secondary | ICD-10-CM | POA: Diagnosis not present

## 2013-06-08 DIAGNOSIS — M23329 Other meniscus derangements, posterior horn of medial meniscus, unspecified knee: Secondary | ICD-10-CM | POA: Diagnosis not present

## 2013-06-08 DIAGNOSIS — M2340 Loose body in knee, unspecified knee: Secondary | ICD-10-CM | POA: Diagnosis not present

## 2013-06-08 DIAGNOSIS — M659 Synovitis and tenosynovitis, unspecified: Secondary | ICD-10-CM | POA: Diagnosis not present

## 2013-06-08 DIAGNOSIS — M2240 Chondromalacia patellae, unspecified knee: Secondary | ICD-10-CM | POA: Diagnosis not present

## 2013-06-08 DIAGNOSIS — M9420 Chondromalacia, unspecified site: Secondary | ICD-10-CM | POA: Diagnosis not present

## 2013-06-16 DIAGNOSIS — M159 Polyosteoarthritis, unspecified: Secondary | ICD-10-CM | POA: Diagnosis not present

## 2013-06-16 DIAGNOSIS — G4700 Insomnia, unspecified: Secondary | ICD-10-CM | POA: Diagnosis not present

## 2013-06-16 DIAGNOSIS — Z6825 Body mass index (BMI) 25.0-25.9, adult: Secondary | ICD-10-CM | POA: Diagnosis not present

## 2013-06-16 DIAGNOSIS — M810 Age-related osteoporosis without current pathological fracture: Secondary | ICD-10-CM | POA: Diagnosis not present

## 2013-07-07 DIAGNOSIS — M159 Polyosteoarthritis, unspecified: Secondary | ICD-10-CM | POA: Diagnosis not present

## 2013-07-07 DIAGNOSIS — M810 Age-related osteoporosis without current pathological fracture: Secondary | ICD-10-CM | POA: Diagnosis not present

## 2013-07-07 DIAGNOSIS — F411 Generalized anxiety disorder: Secondary | ICD-10-CM | POA: Diagnosis not present

## 2013-07-07 DIAGNOSIS — J029 Acute pharyngitis, unspecified: Secondary | ICD-10-CM | POA: Diagnosis not present

## 2013-07-14 DIAGNOSIS — R252 Cramp and spasm: Secondary | ICD-10-CM | POA: Diagnosis not present

## 2013-07-14 DIAGNOSIS — M159 Polyosteoarthritis, unspecified: Secondary | ICD-10-CM | POA: Diagnosis not present

## 2013-07-14 DIAGNOSIS — M810 Age-related osteoporosis without current pathological fracture: Secondary | ICD-10-CM | POA: Diagnosis not present

## 2013-07-14 DIAGNOSIS — K219 Gastro-esophageal reflux disease without esophagitis: Secondary | ICD-10-CM | POA: Diagnosis not present

## 2013-07-14 DIAGNOSIS — R609 Edema, unspecified: Secondary | ICD-10-CM | POA: Diagnosis not present

## 2013-08-11 DIAGNOSIS — G4700 Insomnia, unspecified: Secondary | ICD-10-CM | POA: Diagnosis not present

## 2013-08-11 DIAGNOSIS — M159 Polyosteoarthritis, unspecified: Secondary | ICD-10-CM | POA: Diagnosis not present

## 2013-08-11 DIAGNOSIS — K219 Gastro-esophageal reflux disease without esophagitis: Secondary | ICD-10-CM | POA: Diagnosis not present

## 2013-08-11 DIAGNOSIS — M810 Age-related osteoporosis without current pathological fracture: Secondary | ICD-10-CM | POA: Diagnosis not present

## 2013-08-23 DIAGNOSIS — Z9889 Other specified postprocedural states: Secondary | ICD-10-CM | POA: Diagnosis not present

## 2013-08-23 DIAGNOSIS — Z79899 Other long term (current) drug therapy: Secondary | ICD-10-CM | POA: Diagnosis not present

## 2013-08-23 DIAGNOSIS — M549 Dorsalgia, unspecified: Secondary | ICD-10-CM | POA: Diagnosis not present

## 2013-09-08 DIAGNOSIS — M159 Polyosteoarthritis, unspecified: Secondary | ICD-10-CM | POA: Diagnosis not present

## 2013-09-08 DIAGNOSIS — G4700 Insomnia, unspecified: Secondary | ICD-10-CM | POA: Diagnosis not present

## 2013-09-08 DIAGNOSIS — F411 Generalized anxiety disorder: Secondary | ICD-10-CM | POA: Diagnosis not present

## 2013-09-08 DIAGNOSIS — F329 Major depressive disorder, single episode, unspecified: Secondary | ICD-10-CM | POA: Diagnosis not present

## 2013-09-08 DIAGNOSIS — F3289 Other specified depressive episodes: Secondary | ICD-10-CM | POA: Diagnosis not present

## 2013-09-28 DIAGNOSIS — E041 Nontoxic single thyroid nodule: Secondary | ICD-10-CM | POA: Diagnosis not present

## 2013-10-10 DIAGNOSIS — F3289 Other specified depressive episodes: Secondary | ICD-10-CM | POA: Diagnosis not present

## 2013-10-10 DIAGNOSIS — K219 Gastro-esophageal reflux disease without esophagitis: Secondary | ICD-10-CM | POA: Diagnosis not present

## 2013-10-10 DIAGNOSIS — M159 Polyosteoarthritis, unspecified: Secondary | ICD-10-CM | POA: Diagnosis not present

## 2013-10-10 DIAGNOSIS — F411 Generalized anxiety disorder: Secondary | ICD-10-CM | POA: Diagnosis not present

## 2013-10-10 DIAGNOSIS — F329 Major depressive disorder, single episode, unspecified: Secondary | ICD-10-CM | POA: Diagnosis not present

## 2013-10-13 DIAGNOSIS — G4700 Insomnia, unspecified: Secondary | ICD-10-CM | POA: Diagnosis not present

## 2013-10-13 DIAGNOSIS — R1314 Dysphagia, pharyngoesophageal phase: Secondary | ICD-10-CM | POA: Diagnosis not present

## 2013-10-13 DIAGNOSIS — E041 Nontoxic single thyroid nodule: Secondary | ICD-10-CM | POA: Diagnosis not present

## 2013-10-13 DIAGNOSIS — IMO0001 Reserved for inherently not codable concepts without codable children: Secondary | ICD-10-CM | POA: Diagnosis not present

## 2013-10-19 DIAGNOSIS — K5900 Constipation, unspecified: Secondary | ICD-10-CM | POA: Diagnosis not present

## 2013-10-19 DIAGNOSIS — Z86010 Personal history of colonic polyps: Secondary | ICD-10-CM | POA: Diagnosis not present

## 2013-10-19 DIAGNOSIS — R1310 Dysphagia, unspecified: Secondary | ICD-10-CM | POA: Diagnosis not present

## 2013-10-19 DIAGNOSIS — K219 Gastro-esophageal reflux disease without esophagitis: Secondary | ICD-10-CM | POA: Diagnosis not present

## 2013-10-25 DIAGNOSIS — K219 Gastro-esophageal reflux disease without esophagitis: Secondary | ICD-10-CM | POA: Diagnosis not present

## 2013-10-25 DIAGNOSIS — Z87891 Personal history of nicotine dependence: Secondary | ICD-10-CM | POA: Diagnosis not present

## 2013-10-25 DIAGNOSIS — E039 Hypothyroidism, unspecified: Secondary | ICD-10-CM | POA: Diagnosis not present

## 2013-10-25 DIAGNOSIS — Z800 Family history of malignant neoplasm of digestive organs: Secondary | ICD-10-CM | POA: Diagnosis not present

## 2013-10-25 DIAGNOSIS — M129 Arthropathy, unspecified: Secondary | ICD-10-CM | POA: Diagnosis not present

## 2013-10-25 DIAGNOSIS — R1310 Dysphagia, unspecified: Secondary | ICD-10-CM | POA: Diagnosis not present

## 2013-10-25 DIAGNOSIS — Z96649 Presence of unspecified artificial hip joint: Secondary | ICD-10-CM | POA: Diagnosis not present

## 2013-10-25 DIAGNOSIS — K449 Diaphragmatic hernia without obstruction or gangrene: Secondary | ICD-10-CM | POA: Diagnosis not present

## 2013-10-26 DIAGNOSIS — F411 Generalized anxiety disorder: Secondary | ICD-10-CM | POA: Diagnosis not present

## 2013-10-26 DIAGNOSIS — F329 Major depressive disorder, single episode, unspecified: Secondary | ICD-10-CM | POA: Diagnosis not present

## 2013-10-26 DIAGNOSIS — IMO0001 Reserved for inherently not codable concepts without codable children: Secondary | ICD-10-CM | POA: Diagnosis not present

## 2013-10-26 DIAGNOSIS — M159 Polyosteoarthritis, unspecified: Secondary | ICD-10-CM | POA: Diagnosis not present

## 2013-10-26 DIAGNOSIS — F3289 Other specified depressive episodes: Secondary | ICD-10-CM | POA: Diagnosis not present

## 2013-11-07 DIAGNOSIS — M159 Polyosteoarthritis, unspecified: Secondary | ICD-10-CM | POA: Diagnosis not present

## 2013-11-07 DIAGNOSIS — K219 Gastro-esophageal reflux disease without esophagitis: Secondary | ICD-10-CM | POA: Diagnosis not present

## 2013-11-07 DIAGNOSIS — G4700 Insomnia, unspecified: Secondary | ICD-10-CM | POA: Diagnosis not present

## 2013-11-07 DIAGNOSIS — F411 Generalized anxiety disorder: Secondary | ICD-10-CM | POA: Diagnosis not present

## 2013-11-23 DIAGNOSIS — K219 Gastro-esophageal reflux disease without esophagitis: Secondary | ICD-10-CM | POA: Diagnosis not present

## 2013-11-23 DIAGNOSIS — K5900 Constipation, unspecified: Secondary | ICD-10-CM | POA: Diagnosis not present

## 2013-11-23 DIAGNOSIS — R1310 Dysphagia, unspecified: Secondary | ICD-10-CM | POA: Diagnosis not present

## 2013-11-29 DIAGNOSIS — K224 Dyskinesia of esophagus: Secondary | ICD-10-CM | POA: Diagnosis not present

## 2013-11-29 DIAGNOSIS — R1310 Dysphagia, unspecified: Secondary | ICD-10-CM | POA: Diagnosis not present

## 2013-11-30 DIAGNOSIS — M25569 Pain in unspecified knee: Secondary | ICD-10-CM | POA: Diagnosis not present

## 2013-11-30 DIAGNOSIS — M549 Dorsalgia, unspecified: Secondary | ICD-10-CM | POA: Diagnosis not present

## 2013-11-30 DIAGNOSIS — M961 Postlaminectomy syndrome, not elsewhere classified: Secondary | ICD-10-CM | POA: Diagnosis not present

## 2013-11-30 DIAGNOSIS — M25559 Pain in unspecified hip: Secondary | ICD-10-CM | POA: Diagnosis not present

## 2013-12-05 DIAGNOSIS — M159 Polyosteoarthritis, unspecified: Secondary | ICD-10-CM | POA: Diagnosis not present

## 2013-12-05 DIAGNOSIS — F411 Generalized anxiety disorder: Secondary | ICD-10-CM | POA: Diagnosis not present

## 2013-12-05 DIAGNOSIS — G4700 Insomnia, unspecified: Secondary | ICD-10-CM | POA: Diagnosis not present

## 2013-12-05 DIAGNOSIS — R609 Edema, unspecified: Secondary | ICD-10-CM | POA: Diagnosis not present

## 2013-12-23 DIAGNOSIS — Z23 Encounter for immunization: Secondary | ICD-10-CM | POA: Diagnosis not present

## 2013-12-23 DIAGNOSIS — G4700 Insomnia, unspecified: Secondary | ICD-10-CM | POA: Diagnosis not present

## 2013-12-23 DIAGNOSIS — M159 Polyosteoarthritis, unspecified: Secondary | ICD-10-CM | POA: Diagnosis not present

## 2013-12-23 DIAGNOSIS — F411 Generalized anxiety disorder: Secondary | ICD-10-CM | POA: Diagnosis not present

## 2013-12-23 DIAGNOSIS — K219 Gastro-esophageal reflux disease without esophagitis: Secondary | ICD-10-CM | POA: Diagnosis not present

## 2014-01-02 DIAGNOSIS — G4700 Insomnia, unspecified: Secondary | ICD-10-CM | POA: Diagnosis not present

## 2014-01-02 DIAGNOSIS — F411 Generalized anxiety disorder: Secondary | ICD-10-CM | POA: Diagnosis not present

## 2014-01-02 DIAGNOSIS — G609 Hereditary and idiopathic neuropathy, unspecified: Secondary | ICD-10-CM | POA: Diagnosis not present

## 2014-01-02 DIAGNOSIS — K219 Gastro-esophageal reflux disease without esophagitis: Secondary | ICD-10-CM | POA: Diagnosis not present

## 2014-01-02 DIAGNOSIS — M159 Polyosteoarthritis, unspecified: Secondary | ICD-10-CM | POA: Diagnosis not present

## 2014-01-23 DIAGNOSIS — Z800 Family history of malignant neoplasm of digestive organs: Secondary | ICD-10-CM | POA: Diagnosis not present

## 2014-01-23 DIAGNOSIS — K222 Esophageal obstruction: Secondary | ICD-10-CM | POA: Diagnosis not present

## 2014-01-23 DIAGNOSIS — K219 Gastro-esophageal reflux disease without esophagitis: Secondary | ICD-10-CM | POA: Diagnosis not present

## 2014-01-23 DIAGNOSIS — R1310 Dysphagia, unspecified: Secondary | ICD-10-CM | POA: Diagnosis not present

## 2014-01-30 DIAGNOSIS — G609 Hereditary and idiopathic neuropathy, unspecified: Secondary | ICD-10-CM | POA: Diagnosis not present

## 2014-01-30 DIAGNOSIS — G4700 Insomnia, unspecified: Secondary | ICD-10-CM | POA: Diagnosis not present

## 2014-01-30 DIAGNOSIS — M810 Age-related osteoporosis without current pathological fracture: Secondary | ICD-10-CM | POA: Diagnosis not present

## 2014-01-30 DIAGNOSIS — K219 Gastro-esophageal reflux disease without esophagitis: Secondary | ICD-10-CM | POA: Diagnosis not present

## 2014-01-30 DIAGNOSIS — M159 Polyosteoarthritis, unspecified: Secondary | ICD-10-CM | POA: Diagnosis not present

## 2014-01-30 DIAGNOSIS — F329 Major depressive disorder, single episode, unspecified: Secondary | ICD-10-CM | POA: Diagnosis not present

## 2014-01-30 DIAGNOSIS — F411 Generalized anxiety disorder: Secondary | ICD-10-CM | POA: Diagnosis not present

## 2014-01-30 DIAGNOSIS — R609 Edema, unspecified: Secondary | ICD-10-CM | POA: Diagnosis not present

## 2014-02-22 DIAGNOSIS — M545 Low back pain: Secondary | ICD-10-CM | POA: Diagnosis not present

## 2014-02-22 DIAGNOSIS — Z79891 Long term (current) use of opiate analgesic: Secondary | ICD-10-CM | POA: Diagnosis not present

## 2014-02-22 DIAGNOSIS — G894 Chronic pain syndrome: Secondary | ICD-10-CM | POA: Diagnosis not present

## 2014-02-22 DIAGNOSIS — M961 Postlaminectomy syndrome, not elsewhere classified: Secondary | ICD-10-CM | POA: Diagnosis not present

## 2014-03-07 DIAGNOSIS — M810 Age-related osteoporosis without current pathological fracture: Secondary | ICD-10-CM | POA: Diagnosis not present

## 2014-03-07 DIAGNOSIS — K219 Gastro-esophageal reflux disease without esophagitis: Secondary | ICD-10-CM | POA: Diagnosis not present

## 2014-03-07 DIAGNOSIS — G4700 Insomnia, unspecified: Secondary | ICD-10-CM | POA: Diagnosis not present

## 2014-03-07 DIAGNOSIS — G609 Hereditary and idiopathic neuropathy, unspecified: Secondary | ICD-10-CM | POA: Diagnosis not present

## 2014-03-07 DIAGNOSIS — Z13820 Encounter for screening for osteoporosis: Secondary | ICD-10-CM | POA: Diagnosis not present

## 2014-03-07 DIAGNOSIS — F329 Major depressive disorder, single episode, unspecified: Secondary | ICD-10-CM | POA: Diagnosis not present

## 2014-03-07 DIAGNOSIS — F411 Generalized anxiety disorder: Secondary | ICD-10-CM | POA: Diagnosis not present

## 2014-03-07 DIAGNOSIS — Z9181 History of falling: Secondary | ICD-10-CM | POA: Diagnosis not present

## 2014-03-14 DIAGNOSIS — L814 Other melanin hyperpigmentation: Secondary | ICD-10-CM | POA: Diagnosis not present

## 2014-03-14 DIAGNOSIS — L570 Actinic keratosis: Secondary | ICD-10-CM | POA: Diagnosis not present

## 2014-03-14 DIAGNOSIS — D485 Neoplasm of uncertain behavior of skin: Secondary | ICD-10-CM | POA: Diagnosis not present

## 2014-03-14 DIAGNOSIS — L821 Other seborrheic keratosis: Secondary | ICD-10-CM | POA: Diagnosis not present

## 2014-03-22 DIAGNOSIS — Z13820 Encounter for screening for osteoporosis: Secondary | ICD-10-CM | POA: Diagnosis not present

## 2014-03-22 DIAGNOSIS — M810 Age-related osteoporosis without current pathological fracture: Secondary | ICD-10-CM | POA: Diagnosis not present

## 2014-04-04 DIAGNOSIS — F411 Generalized anxiety disorder: Secondary | ICD-10-CM | POA: Diagnosis not present

## 2014-04-04 DIAGNOSIS — K219 Gastro-esophageal reflux disease without esophagitis: Secondary | ICD-10-CM | POA: Diagnosis not present

## 2014-04-04 DIAGNOSIS — G609 Hereditary and idiopathic neuropathy, unspecified: Secondary | ICD-10-CM | POA: Diagnosis not present

## 2014-04-04 DIAGNOSIS — Z6823 Body mass index (BMI) 23.0-23.9, adult: Secondary | ICD-10-CM | POA: Diagnosis not present

## 2014-04-04 DIAGNOSIS — R609 Edema, unspecified: Secondary | ICD-10-CM | POA: Diagnosis not present

## 2014-04-04 DIAGNOSIS — F329 Major depressive disorder, single episode, unspecified: Secondary | ICD-10-CM | POA: Diagnosis not present

## 2014-04-04 DIAGNOSIS — M159 Polyosteoarthritis, unspecified: Secondary | ICD-10-CM | POA: Diagnosis not present

## 2014-04-04 DIAGNOSIS — M810 Age-related osteoporosis without current pathological fracture: Secondary | ICD-10-CM | POA: Diagnosis not present

## 2014-04-04 DIAGNOSIS — G4700 Insomnia, unspecified: Secondary | ICD-10-CM | POA: Diagnosis not present

## 2014-04-06 DIAGNOSIS — R269 Unspecified abnormalities of gait and mobility: Secondary | ICD-10-CM | POA: Diagnosis not present

## 2014-04-06 DIAGNOSIS — G629 Polyneuropathy, unspecified: Secondary | ICD-10-CM | POA: Diagnosis not present

## 2014-04-06 DIAGNOSIS — E1342 Other specified diabetes mellitus with diabetic polyneuropathy: Secondary | ICD-10-CM | POA: Diagnosis not present

## 2014-04-06 DIAGNOSIS — G603 Idiopathic progressive neuropathy: Secondary | ICD-10-CM | POA: Diagnosis not present

## 2014-04-27 DIAGNOSIS — M5416 Radiculopathy, lumbar region: Secondary | ICD-10-CM | POA: Diagnosis not present

## 2014-05-01 DIAGNOSIS — M4806 Spinal stenosis, lumbar region: Secondary | ICD-10-CM | POA: Diagnosis not present

## 2014-05-01 DIAGNOSIS — R269 Unspecified abnormalities of gait and mobility: Secondary | ICD-10-CM | POA: Diagnosis not present

## 2014-05-02 DIAGNOSIS — F329 Major depressive disorder, single episode, unspecified: Secondary | ICD-10-CM | POA: Diagnosis not present

## 2014-05-02 DIAGNOSIS — M159 Polyosteoarthritis, unspecified: Secondary | ICD-10-CM | POA: Diagnosis not present

## 2014-05-02 DIAGNOSIS — G4700 Insomnia, unspecified: Secondary | ICD-10-CM | POA: Diagnosis not present

## 2014-05-02 DIAGNOSIS — Z6823 Body mass index (BMI) 23.0-23.9, adult: Secondary | ICD-10-CM | POA: Diagnosis not present

## 2014-05-02 DIAGNOSIS — F411 Generalized anxiety disorder: Secondary | ICD-10-CM | POA: Diagnosis not present

## 2014-05-02 DIAGNOSIS — R609 Edema, unspecified: Secondary | ICD-10-CM | POA: Diagnosis not present

## 2014-05-02 DIAGNOSIS — G609 Hereditary and idiopathic neuropathy, unspecified: Secondary | ICD-10-CM | POA: Diagnosis not present

## 2014-05-02 DIAGNOSIS — K219 Gastro-esophageal reflux disease without esophagitis: Secondary | ICD-10-CM | POA: Diagnosis not present

## 2014-05-02 DIAGNOSIS — M810 Age-related osteoporosis without current pathological fracture: Secondary | ICD-10-CM | POA: Diagnosis not present

## 2014-05-04 DIAGNOSIS — R269 Unspecified abnormalities of gait and mobility: Secondary | ICD-10-CM | POA: Diagnosis not present

## 2014-05-04 DIAGNOSIS — Z981 Arthrodesis status: Secondary | ICD-10-CM | POA: Diagnosis not present

## 2014-05-04 DIAGNOSIS — M4806 Spinal stenosis, lumbar region: Secondary | ICD-10-CM | POA: Diagnosis not present

## 2014-05-09 DIAGNOSIS — M4806 Spinal stenosis, lumbar region: Secondary | ICD-10-CM | POA: Diagnosis not present

## 2014-05-09 DIAGNOSIS — R269 Unspecified abnormalities of gait and mobility: Secondary | ICD-10-CM | POA: Diagnosis not present

## 2014-05-12 DIAGNOSIS — R42 Dizziness and giddiness: Secondary | ICD-10-CM | POA: Diagnosis not present

## 2014-05-12 DIAGNOSIS — S0990XA Unspecified injury of head, initial encounter: Secondary | ICD-10-CM | POA: Diagnosis not present

## 2014-05-12 DIAGNOSIS — R51 Headache: Secondary | ICD-10-CM | POA: Diagnosis not present

## 2014-05-12 DIAGNOSIS — R269 Unspecified abnormalities of gait and mobility: Secondary | ICD-10-CM | POA: Diagnosis not present

## 2014-05-15 DIAGNOSIS — M25561 Pain in right knee: Secondary | ICD-10-CM | POA: Diagnosis not present

## 2014-05-15 DIAGNOSIS — G609 Hereditary and idiopathic neuropathy, unspecified: Secondary | ICD-10-CM | POA: Diagnosis not present

## 2014-05-15 DIAGNOSIS — M159 Polyosteoarthritis, unspecified: Secondary | ICD-10-CM | POA: Diagnosis not present

## 2014-05-15 DIAGNOSIS — M25461 Effusion, right knee: Secondary | ICD-10-CM | POA: Diagnosis not present

## 2014-05-15 DIAGNOSIS — K219 Gastro-esophageal reflux disease without esophagitis: Secondary | ICD-10-CM | POA: Diagnosis not present

## 2014-05-15 DIAGNOSIS — R609 Edema, unspecified: Secondary | ICD-10-CM | POA: Diagnosis not present

## 2014-05-15 DIAGNOSIS — F411 Generalized anxiety disorder: Secondary | ICD-10-CM | POA: Diagnosis not present

## 2014-05-15 DIAGNOSIS — F329 Major depressive disorder, single episode, unspecified: Secondary | ICD-10-CM | POA: Diagnosis not present

## 2014-05-15 DIAGNOSIS — G4700 Insomnia, unspecified: Secondary | ICD-10-CM | POA: Diagnosis not present

## 2014-05-15 DIAGNOSIS — S8991XA Unspecified injury of right lower leg, initial encounter: Secondary | ICD-10-CM | POA: Diagnosis not present

## 2014-05-15 DIAGNOSIS — M810 Age-related osteoporosis without current pathological fracture: Secondary | ICD-10-CM | POA: Diagnosis not present

## 2014-05-15 DIAGNOSIS — Z6823 Body mass index (BMI) 23.0-23.9, adult: Secondary | ICD-10-CM | POA: Diagnosis not present

## 2014-05-18 DIAGNOSIS — M545 Low back pain: Secondary | ICD-10-CM | POA: Diagnosis not present

## 2014-05-19 DIAGNOSIS — R269 Unspecified abnormalities of gait and mobility: Secondary | ICD-10-CM | POA: Diagnosis not present

## 2014-05-19 DIAGNOSIS — J0110 Acute frontal sinusitis, unspecified: Secondary | ICD-10-CM | POA: Diagnosis not present

## 2014-05-19 DIAGNOSIS — M4806 Spinal stenosis, lumbar region: Secondary | ICD-10-CM | POA: Diagnosis not present

## 2014-05-30 DIAGNOSIS — G609 Hereditary and idiopathic neuropathy, unspecified: Secondary | ICD-10-CM | POA: Diagnosis not present

## 2014-05-30 DIAGNOSIS — R609 Edema, unspecified: Secondary | ICD-10-CM | POA: Diagnosis not present

## 2014-05-30 DIAGNOSIS — G4700 Insomnia, unspecified: Secondary | ICD-10-CM | POA: Diagnosis not present

## 2014-05-30 DIAGNOSIS — M159 Polyosteoarthritis, unspecified: Secondary | ICD-10-CM | POA: Diagnosis not present

## 2014-05-30 DIAGNOSIS — M25561 Pain in right knee: Secondary | ICD-10-CM | POA: Diagnosis not present

## 2014-05-30 DIAGNOSIS — F411 Generalized anxiety disorder: Secondary | ICD-10-CM | POA: Diagnosis not present

## 2014-05-30 DIAGNOSIS — F329 Major depressive disorder, single episode, unspecified: Secondary | ICD-10-CM | POA: Diagnosis not present

## 2014-05-30 DIAGNOSIS — Z6824 Body mass index (BMI) 24.0-24.9, adult: Secondary | ICD-10-CM | POA: Diagnosis not present

## 2014-05-30 DIAGNOSIS — K219 Gastro-esophageal reflux disease without esophagitis: Secondary | ICD-10-CM | POA: Diagnosis not present

## 2014-05-30 DIAGNOSIS — M810 Age-related osteoporosis without current pathological fracture: Secondary | ICD-10-CM | POA: Diagnosis not present

## 2014-06-06 DIAGNOSIS — M1711 Unilateral primary osteoarthritis, right knee: Secondary | ICD-10-CM | POA: Diagnosis not present

## 2014-06-12 DIAGNOSIS — M1711 Unilateral primary osteoarthritis, right knee: Secondary | ICD-10-CM | POA: Diagnosis not present

## 2014-06-12 DIAGNOSIS — M25561 Pain in right knee: Secondary | ICD-10-CM | POA: Diagnosis not present

## 2014-06-12 DIAGNOSIS — R262 Difficulty in walking, not elsewhere classified: Secondary | ICD-10-CM | POA: Diagnosis not present

## 2014-06-15 DIAGNOSIS — M25561 Pain in right knee: Secondary | ICD-10-CM | POA: Diagnosis not present

## 2014-06-15 DIAGNOSIS — M1711 Unilateral primary osteoarthritis, right knee: Secondary | ICD-10-CM | POA: Diagnosis not present

## 2014-06-15 DIAGNOSIS — R262 Difficulty in walking, not elsewhere classified: Secondary | ICD-10-CM | POA: Diagnosis not present

## 2014-06-19 DIAGNOSIS — M1711 Unilateral primary osteoarthritis, right knee: Secondary | ICD-10-CM | POA: Diagnosis not present

## 2014-06-19 DIAGNOSIS — R262 Difficulty in walking, not elsewhere classified: Secondary | ICD-10-CM | POA: Diagnosis not present

## 2014-06-19 DIAGNOSIS — M25561 Pain in right knee: Secondary | ICD-10-CM | POA: Diagnosis not present

## 2014-06-27 DIAGNOSIS — Z6823 Body mass index (BMI) 23.0-23.9, adult: Secondary | ICD-10-CM | POA: Diagnosis not present

## 2014-06-27 DIAGNOSIS — K219 Gastro-esophageal reflux disease without esophagitis: Secondary | ICD-10-CM | POA: Diagnosis not present

## 2014-06-27 DIAGNOSIS — M810 Age-related osteoporosis without current pathological fracture: Secondary | ICD-10-CM | POA: Diagnosis not present

## 2014-06-27 DIAGNOSIS — R609 Edema, unspecified: Secondary | ICD-10-CM | POA: Diagnosis not present

## 2014-06-27 DIAGNOSIS — F329 Major depressive disorder, single episode, unspecified: Secondary | ICD-10-CM | POA: Diagnosis not present

## 2014-06-27 DIAGNOSIS — M25561 Pain in right knee: Secondary | ICD-10-CM | POA: Diagnosis not present

## 2014-06-27 DIAGNOSIS — M159 Polyosteoarthritis, unspecified: Secondary | ICD-10-CM | POA: Diagnosis not present

## 2014-06-27 DIAGNOSIS — F411 Generalized anxiety disorder: Secondary | ICD-10-CM | POA: Diagnosis not present

## 2014-06-27 DIAGNOSIS — G4700 Insomnia, unspecified: Secondary | ICD-10-CM | POA: Diagnosis not present

## 2014-06-27 DIAGNOSIS — G609 Hereditary and idiopathic neuropathy, unspecified: Secondary | ICD-10-CM | POA: Diagnosis not present

## 2014-07-04 DIAGNOSIS — Z87891 Personal history of nicotine dependence: Secondary | ICD-10-CM | POA: Diagnosis not present

## 2014-07-04 DIAGNOSIS — S52202A Unspecified fracture of shaft of left ulna, initial encounter for closed fracture: Secondary | ICD-10-CM | POA: Diagnosis not present

## 2014-07-04 DIAGNOSIS — S8991XA Unspecified injury of right lower leg, initial encounter: Secondary | ICD-10-CM | POA: Diagnosis not present

## 2014-07-04 DIAGNOSIS — S5292XA Unspecified fracture of left forearm, initial encounter for closed fracture: Secondary | ICD-10-CM | POA: Diagnosis not present

## 2014-07-04 DIAGNOSIS — S4991XA Unspecified injury of right shoulder and upper arm, initial encounter: Secondary | ICD-10-CM | POA: Diagnosis not present

## 2014-07-04 DIAGNOSIS — M25461 Effusion, right knee: Secondary | ICD-10-CM | POA: Diagnosis not present

## 2014-07-05 DIAGNOSIS — M810 Age-related osteoporosis without current pathological fracture: Secondary | ICD-10-CM | POA: Diagnosis not present

## 2014-07-05 DIAGNOSIS — F411 Generalized anxiety disorder: Secondary | ICD-10-CM | POA: Diagnosis not present

## 2014-07-05 DIAGNOSIS — K219 Gastro-esophageal reflux disease without esophagitis: Secondary | ICD-10-CM | POA: Diagnosis not present

## 2014-07-05 DIAGNOSIS — G4700 Insomnia, unspecified: Secondary | ICD-10-CM | POA: Diagnosis not present

## 2014-07-05 DIAGNOSIS — M159 Polyosteoarthritis, unspecified: Secondary | ICD-10-CM | POA: Diagnosis not present

## 2014-07-05 DIAGNOSIS — R609 Edema, unspecified: Secondary | ICD-10-CM | POA: Diagnosis not present

## 2014-07-05 DIAGNOSIS — G609 Hereditary and idiopathic neuropathy, unspecified: Secondary | ICD-10-CM | POA: Diagnosis not present

## 2014-07-05 DIAGNOSIS — Z6823 Body mass index (BMI) 23.0-23.9, adult: Secondary | ICD-10-CM | POA: Diagnosis not present

## 2014-07-05 DIAGNOSIS — F329 Major depressive disorder, single episode, unspecified: Secondary | ICD-10-CM | POA: Diagnosis not present

## 2014-07-12 DIAGNOSIS — M25511 Pain in right shoulder: Secondary | ICD-10-CM | POA: Diagnosis not present

## 2014-07-12 DIAGNOSIS — M25561 Pain in right knee: Secondary | ICD-10-CM | POA: Diagnosis not present

## 2014-07-19 DIAGNOSIS — Z23 Encounter for immunization: Secondary | ICD-10-CM | POA: Diagnosis not present

## 2014-07-19 DIAGNOSIS — Z85828 Personal history of other malignant neoplasm of skin: Secondary | ICD-10-CM | POA: Diagnosis not present

## 2014-07-19 DIAGNOSIS — N3001 Acute cystitis with hematuria: Secondary | ICD-10-CM | POA: Diagnosis present

## 2014-07-19 DIAGNOSIS — B9620 Unspecified Escherichia coli [E. coli] as the cause of diseases classified elsewhere: Secondary | ICD-10-CM | POA: Diagnosis present

## 2014-07-19 DIAGNOSIS — S79922A Unspecified injury of left thigh, initial encounter: Secondary | ICD-10-CM | POA: Diagnosis not present

## 2014-07-19 DIAGNOSIS — M810 Age-related osteoporosis without current pathological fracture: Secondary | ICD-10-CM | POA: Diagnosis not present

## 2014-07-19 DIAGNOSIS — M25552 Pain in left hip: Secondary | ICD-10-CM | POA: Diagnosis not present

## 2014-07-19 DIAGNOSIS — Z79899 Other long term (current) drug therapy: Secondary | ICD-10-CM | POA: Diagnosis not present

## 2014-07-19 DIAGNOSIS — D500 Iron deficiency anemia secondary to blood loss (chronic): Secondary | ICD-10-CM | POA: Diagnosis not present

## 2014-07-19 DIAGNOSIS — M25551 Pain in right hip: Secondary | ICD-10-CM | POA: Diagnosis not present

## 2014-07-19 DIAGNOSIS — Z87891 Personal history of nicotine dependence: Secondary | ICD-10-CM | POA: Diagnosis not present

## 2014-07-19 DIAGNOSIS — K219 Gastro-esophageal reflux disease without esophagitis: Secondary | ICD-10-CM | POA: Diagnosis not present

## 2014-07-19 DIAGNOSIS — Z471 Aftercare following joint replacement surgery: Secondary | ICD-10-CM | POA: Diagnosis not present

## 2014-07-19 DIAGNOSIS — D62 Acute posthemorrhagic anemia: Secondary | ICD-10-CM | POA: Diagnosis not present

## 2014-07-19 DIAGNOSIS — Z96642 Presence of left artificial hip joint: Secondary | ICD-10-CM | POA: Diagnosis not present

## 2014-07-19 DIAGNOSIS — Z01811 Encounter for preprocedural respiratory examination: Secondary | ICD-10-CM | POA: Diagnosis not present

## 2014-07-19 DIAGNOSIS — F418 Other specified anxiety disorders: Secondary | ICD-10-CM | POA: Diagnosis present

## 2014-07-19 DIAGNOSIS — S72142A Displaced intertrochanteric fracture of left femur, initial encounter for closed fracture: Secondary | ICD-10-CM | POA: Diagnosis not present

## 2014-07-19 DIAGNOSIS — Z888 Allergy status to other drugs, medicaments and biological substances status: Secondary | ICD-10-CM | POA: Diagnosis not present

## 2014-07-19 DIAGNOSIS — N3000 Acute cystitis without hematuria: Secondary | ICD-10-CM | POA: Diagnosis not present

## 2014-07-19 DIAGNOSIS — M1612 Unilateral primary osteoarthritis, left hip: Secondary | ICD-10-CM | POA: Diagnosis not present

## 2014-07-19 DIAGNOSIS — R52 Pain, unspecified: Secondary | ICD-10-CM | POA: Diagnosis not present

## 2014-07-19 DIAGNOSIS — S72002A Fracture of unspecified part of neck of left femur, initial encounter for closed fracture: Secondary | ICD-10-CM | POA: Diagnosis not present

## 2014-07-24 DIAGNOSIS — F419 Anxiety disorder, unspecified: Secondary | ICD-10-CM | POA: Diagnosis not present

## 2014-07-24 DIAGNOSIS — S72002D Fracture of unspecified part of neck of left femur, subsequent encounter for closed fracture with routine healing: Secondary | ICD-10-CM | POA: Diagnosis not present

## 2014-07-24 DIAGNOSIS — M80052D Age-related osteoporosis with current pathological fracture, left femur, subsequent encounter for fracture with routine healing: Secondary | ICD-10-CM | POA: Diagnosis not present

## 2014-07-24 DIAGNOSIS — N3000 Acute cystitis without hematuria: Secondary | ICD-10-CM | POA: Diagnosis not present

## 2014-07-24 DIAGNOSIS — F329 Major depressive disorder, single episode, unspecified: Secondary | ICD-10-CM | POA: Diagnosis not present

## 2014-07-24 DIAGNOSIS — M1990 Unspecified osteoarthritis, unspecified site: Secondary | ICD-10-CM | POA: Diagnosis not present

## 2014-07-25 DIAGNOSIS — R609 Edema, unspecified: Secondary | ICD-10-CM | POA: Diagnosis not present

## 2014-07-25 DIAGNOSIS — F329 Major depressive disorder, single episode, unspecified: Secondary | ICD-10-CM | POA: Diagnosis not present

## 2014-07-25 DIAGNOSIS — M159 Polyosteoarthritis, unspecified: Secondary | ICD-10-CM | POA: Diagnosis not present

## 2014-07-25 DIAGNOSIS — L237 Allergic contact dermatitis due to plants, except food: Secondary | ICD-10-CM | POA: Diagnosis not present

## 2014-07-25 DIAGNOSIS — G4700 Insomnia, unspecified: Secondary | ICD-10-CM | POA: Diagnosis not present

## 2014-07-25 DIAGNOSIS — G609 Hereditary and idiopathic neuropathy, unspecified: Secondary | ICD-10-CM | POA: Diagnosis not present

## 2014-07-25 DIAGNOSIS — Z6823 Body mass index (BMI) 23.0-23.9, adult: Secondary | ICD-10-CM | POA: Diagnosis not present

## 2014-07-25 DIAGNOSIS — F411 Generalized anxiety disorder: Secondary | ICD-10-CM | POA: Diagnosis not present

## 2014-07-25 DIAGNOSIS — M810 Age-related osteoporosis without current pathological fracture: Secondary | ICD-10-CM | POA: Diagnosis not present

## 2014-07-25 DIAGNOSIS — D649 Anemia, unspecified: Secondary | ICD-10-CM | POA: Diagnosis not present

## 2014-07-25 DIAGNOSIS — K219 Gastro-esophageal reflux disease without esophagitis: Secondary | ICD-10-CM | POA: Diagnosis not present

## 2014-07-26 DIAGNOSIS — M1990 Unspecified osteoarthritis, unspecified site: Secondary | ICD-10-CM | POA: Diagnosis not present

## 2014-07-26 DIAGNOSIS — N3000 Acute cystitis without hematuria: Secondary | ICD-10-CM | POA: Diagnosis not present

## 2014-07-26 DIAGNOSIS — F329 Major depressive disorder, single episode, unspecified: Secondary | ICD-10-CM | POA: Diagnosis not present

## 2014-07-26 DIAGNOSIS — S72002D Fracture of unspecified part of neck of left femur, subsequent encounter for closed fracture with routine healing: Secondary | ICD-10-CM | POA: Diagnosis not present

## 2014-07-26 DIAGNOSIS — M80052D Age-related osteoporosis with current pathological fracture, left femur, subsequent encounter for fracture with routine healing: Secondary | ICD-10-CM | POA: Diagnosis not present

## 2014-07-26 DIAGNOSIS — F419 Anxiety disorder, unspecified: Secondary | ICD-10-CM | POA: Diagnosis not present

## 2014-07-28 DIAGNOSIS — F419 Anxiety disorder, unspecified: Secondary | ICD-10-CM | POA: Diagnosis not present

## 2014-07-28 DIAGNOSIS — M1990 Unspecified osteoarthritis, unspecified site: Secondary | ICD-10-CM | POA: Diagnosis not present

## 2014-07-28 DIAGNOSIS — N3000 Acute cystitis without hematuria: Secondary | ICD-10-CM | POA: Diagnosis not present

## 2014-07-28 DIAGNOSIS — F329 Major depressive disorder, single episode, unspecified: Secondary | ICD-10-CM | POA: Diagnosis not present

## 2014-07-28 DIAGNOSIS — S72002D Fracture of unspecified part of neck of left femur, subsequent encounter for closed fracture with routine healing: Secondary | ICD-10-CM | POA: Diagnosis not present

## 2014-07-28 DIAGNOSIS — M80052D Age-related osteoporosis with current pathological fracture, left femur, subsequent encounter for fracture with routine healing: Secondary | ICD-10-CM | POA: Diagnosis not present

## 2014-07-31 DIAGNOSIS — M80052D Age-related osteoporosis with current pathological fracture, left femur, subsequent encounter for fracture with routine healing: Secondary | ICD-10-CM | POA: Diagnosis not present

## 2014-07-31 DIAGNOSIS — N3000 Acute cystitis without hematuria: Secondary | ICD-10-CM | POA: Diagnosis not present

## 2014-07-31 DIAGNOSIS — F329 Major depressive disorder, single episode, unspecified: Secondary | ICD-10-CM | POA: Diagnosis not present

## 2014-07-31 DIAGNOSIS — M1990 Unspecified osteoarthritis, unspecified site: Secondary | ICD-10-CM | POA: Diagnosis not present

## 2014-07-31 DIAGNOSIS — S72002D Fracture of unspecified part of neck of left femur, subsequent encounter for closed fracture with routine healing: Secondary | ICD-10-CM | POA: Diagnosis not present

## 2014-07-31 DIAGNOSIS — F419 Anxiety disorder, unspecified: Secondary | ICD-10-CM | POA: Diagnosis not present

## 2014-08-02 DIAGNOSIS — G609 Hereditary and idiopathic neuropathy, unspecified: Secondary | ICD-10-CM | POA: Diagnosis not present

## 2014-08-02 DIAGNOSIS — F329 Major depressive disorder, single episode, unspecified: Secondary | ICD-10-CM | POA: Diagnosis not present

## 2014-08-02 DIAGNOSIS — Z6823 Body mass index (BMI) 23.0-23.9, adult: Secondary | ICD-10-CM | POA: Diagnosis not present

## 2014-08-02 DIAGNOSIS — F411 Generalized anxiety disorder: Secondary | ICD-10-CM | POA: Diagnosis not present

## 2014-08-02 DIAGNOSIS — K219 Gastro-esophageal reflux disease without esophagitis: Secondary | ICD-10-CM | POA: Diagnosis not present

## 2014-08-02 DIAGNOSIS — R609 Edema, unspecified: Secondary | ICD-10-CM | POA: Diagnosis not present

## 2014-08-02 DIAGNOSIS — N3000 Acute cystitis without hematuria: Secondary | ICD-10-CM | POA: Diagnosis not present

## 2014-08-02 DIAGNOSIS — G4700 Insomnia, unspecified: Secondary | ICD-10-CM | POA: Diagnosis not present

## 2014-08-02 DIAGNOSIS — M1990 Unspecified osteoarthritis, unspecified site: Secondary | ICD-10-CM | POA: Diagnosis not present

## 2014-08-02 DIAGNOSIS — M80052D Age-related osteoporosis with current pathological fracture, left femur, subsequent encounter for fracture with routine healing: Secondary | ICD-10-CM | POA: Diagnosis not present

## 2014-08-02 DIAGNOSIS — S72002D Fracture of unspecified part of neck of left femur, subsequent encounter for closed fracture with routine healing: Secondary | ICD-10-CM | POA: Diagnosis not present

## 2014-08-02 DIAGNOSIS — F419 Anxiety disorder, unspecified: Secondary | ICD-10-CM | POA: Diagnosis not present

## 2014-08-02 DIAGNOSIS — M810 Age-related osteoporosis without current pathological fracture: Secondary | ICD-10-CM | POA: Diagnosis not present

## 2014-08-02 DIAGNOSIS — M159 Polyosteoarthritis, unspecified: Secondary | ICD-10-CM | POA: Diagnosis not present

## 2014-08-02 DIAGNOSIS — D649 Anemia, unspecified: Secondary | ICD-10-CM | POA: Diagnosis not present

## 2014-08-04 DIAGNOSIS — F329 Major depressive disorder, single episode, unspecified: Secondary | ICD-10-CM | POA: Diagnosis not present

## 2014-08-04 DIAGNOSIS — F419 Anxiety disorder, unspecified: Secondary | ICD-10-CM | POA: Diagnosis not present

## 2014-08-04 DIAGNOSIS — S72002D Fracture of unspecified part of neck of left femur, subsequent encounter for closed fracture with routine healing: Secondary | ICD-10-CM | POA: Diagnosis not present

## 2014-08-04 DIAGNOSIS — M1990 Unspecified osteoarthritis, unspecified site: Secondary | ICD-10-CM | POA: Diagnosis not present

## 2014-08-04 DIAGNOSIS — M80052D Age-related osteoporosis with current pathological fracture, left femur, subsequent encounter for fracture with routine healing: Secondary | ICD-10-CM | POA: Diagnosis not present

## 2014-08-04 DIAGNOSIS — N3000 Acute cystitis without hematuria: Secondary | ICD-10-CM | POA: Diagnosis not present

## 2014-08-07 DIAGNOSIS — M80052D Age-related osteoporosis with current pathological fracture, left femur, subsequent encounter for fracture with routine healing: Secondary | ICD-10-CM | POA: Diagnosis not present

## 2014-08-07 DIAGNOSIS — S72002D Fracture of unspecified part of neck of left femur, subsequent encounter for closed fracture with routine healing: Secondary | ICD-10-CM | POA: Diagnosis not present

## 2014-08-07 DIAGNOSIS — N3000 Acute cystitis without hematuria: Secondary | ICD-10-CM | POA: Diagnosis not present

## 2014-08-07 DIAGNOSIS — F419 Anxiety disorder, unspecified: Secondary | ICD-10-CM | POA: Diagnosis not present

## 2014-08-07 DIAGNOSIS — M1990 Unspecified osteoarthritis, unspecified site: Secondary | ICD-10-CM | POA: Diagnosis not present

## 2014-08-07 DIAGNOSIS — F329 Major depressive disorder, single episode, unspecified: Secondary | ICD-10-CM | POA: Diagnosis not present

## 2014-08-09 DIAGNOSIS — M80052D Age-related osteoporosis with current pathological fracture, left femur, subsequent encounter for fracture with routine healing: Secondary | ICD-10-CM | POA: Diagnosis not present

## 2014-08-09 DIAGNOSIS — N3000 Acute cystitis without hematuria: Secondary | ICD-10-CM | POA: Diagnosis not present

## 2014-08-09 DIAGNOSIS — S72002D Fracture of unspecified part of neck of left femur, subsequent encounter for closed fracture with routine healing: Secondary | ICD-10-CM | POA: Diagnosis not present

## 2014-08-09 DIAGNOSIS — F419 Anxiety disorder, unspecified: Secondary | ICD-10-CM | POA: Diagnosis not present

## 2014-08-09 DIAGNOSIS — M1990 Unspecified osteoarthritis, unspecified site: Secondary | ICD-10-CM | POA: Diagnosis not present

## 2014-08-09 DIAGNOSIS — F329 Major depressive disorder, single episode, unspecified: Secondary | ICD-10-CM | POA: Diagnosis not present

## 2014-08-11 DIAGNOSIS — M1990 Unspecified osteoarthritis, unspecified site: Secondary | ICD-10-CM | POA: Diagnosis not present

## 2014-08-11 DIAGNOSIS — N3000 Acute cystitis without hematuria: Secondary | ICD-10-CM | POA: Diagnosis not present

## 2014-08-11 DIAGNOSIS — M80052D Age-related osteoporosis with current pathological fracture, left femur, subsequent encounter for fracture with routine healing: Secondary | ICD-10-CM | POA: Diagnosis not present

## 2014-08-11 DIAGNOSIS — S72002D Fracture of unspecified part of neck of left femur, subsequent encounter for closed fracture with routine healing: Secondary | ICD-10-CM | POA: Diagnosis not present

## 2014-08-11 DIAGNOSIS — F329 Major depressive disorder, single episode, unspecified: Secondary | ICD-10-CM | POA: Diagnosis not present

## 2014-08-11 DIAGNOSIS — F419 Anxiety disorder, unspecified: Secondary | ICD-10-CM | POA: Diagnosis not present

## 2014-08-14 DIAGNOSIS — M80052D Age-related osteoporosis with current pathological fracture, left femur, subsequent encounter for fracture with routine healing: Secondary | ICD-10-CM | POA: Diagnosis not present

## 2014-08-14 DIAGNOSIS — M1990 Unspecified osteoarthritis, unspecified site: Secondary | ICD-10-CM | POA: Diagnosis not present

## 2014-08-14 DIAGNOSIS — S72002D Fracture of unspecified part of neck of left femur, subsequent encounter for closed fracture with routine healing: Secondary | ICD-10-CM | POA: Diagnosis not present

## 2014-08-14 DIAGNOSIS — N3000 Acute cystitis without hematuria: Secondary | ICD-10-CM | POA: Diagnosis not present

## 2014-08-14 DIAGNOSIS — F329 Major depressive disorder, single episode, unspecified: Secondary | ICD-10-CM | POA: Diagnosis not present

## 2014-08-14 DIAGNOSIS — F419 Anxiety disorder, unspecified: Secondary | ICD-10-CM | POA: Diagnosis not present

## 2014-08-15 DIAGNOSIS — F411 Generalized anxiety disorder: Secondary | ICD-10-CM | POA: Diagnosis not present

## 2014-08-15 DIAGNOSIS — K219 Gastro-esophageal reflux disease without esophagitis: Secondary | ICD-10-CM | POA: Diagnosis not present

## 2014-08-15 DIAGNOSIS — Z6823 Body mass index (BMI) 23.0-23.9, adult: Secondary | ICD-10-CM | POA: Diagnosis not present

## 2014-08-15 DIAGNOSIS — T148 Other injury of unspecified body region: Secondary | ICD-10-CM | POA: Diagnosis not present

## 2014-08-15 DIAGNOSIS — G609 Hereditary and idiopathic neuropathy, unspecified: Secondary | ICD-10-CM | POA: Diagnosis not present

## 2014-08-15 DIAGNOSIS — G4700 Insomnia, unspecified: Secondary | ICD-10-CM | POA: Diagnosis not present

## 2014-08-15 DIAGNOSIS — F329 Major depressive disorder, single episode, unspecified: Secondary | ICD-10-CM | POA: Diagnosis not present

## 2014-08-15 DIAGNOSIS — R58 Hemorrhage, not elsewhere classified: Secondary | ICD-10-CM | POA: Diagnosis not present

## 2014-08-15 DIAGNOSIS — R238 Other skin changes: Secondary | ICD-10-CM | POA: Diagnosis not present

## 2014-08-15 DIAGNOSIS — R609 Edema, unspecified: Secondary | ICD-10-CM | POA: Diagnosis not present

## 2014-08-15 DIAGNOSIS — M159 Polyosteoarthritis, unspecified: Secondary | ICD-10-CM | POA: Diagnosis not present

## 2014-08-15 DIAGNOSIS — M810 Age-related osteoporosis without current pathological fracture: Secondary | ICD-10-CM | POA: Diagnosis not present

## 2014-08-16 DIAGNOSIS — S72002D Fracture of unspecified part of neck of left femur, subsequent encounter for closed fracture with routine healing: Secondary | ICD-10-CM | POA: Diagnosis not present

## 2014-08-16 DIAGNOSIS — M1990 Unspecified osteoarthritis, unspecified site: Secondary | ICD-10-CM | POA: Diagnosis not present

## 2014-08-16 DIAGNOSIS — N3000 Acute cystitis without hematuria: Secondary | ICD-10-CM | POA: Diagnosis not present

## 2014-08-16 DIAGNOSIS — M80052D Age-related osteoporosis with current pathological fracture, left femur, subsequent encounter for fracture with routine healing: Secondary | ICD-10-CM | POA: Diagnosis not present

## 2014-08-16 DIAGNOSIS — F329 Major depressive disorder, single episode, unspecified: Secondary | ICD-10-CM | POA: Diagnosis not present

## 2014-08-16 DIAGNOSIS — F419 Anxiety disorder, unspecified: Secondary | ICD-10-CM | POA: Diagnosis not present

## 2014-08-17 DIAGNOSIS — F419 Anxiety disorder, unspecified: Secondary | ICD-10-CM | POA: Diagnosis not present

## 2014-08-17 DIAGNOSIS — S72002D Fracture of unspecified part of neck of left femur, subsequent encounter for closed fracture with routine healing: Secondary | ICD-10-CM | POA: Diagnosis not present

## 2014-08-17 DIAGNOSIS — M1990 Unspecified osteoarthritis, unspecified site: Secondary | ICD-10-CM | POA: Diagnosis not present

## 2014-08-17 DIAGNOSIS — Z79891 Long term (current) use of opiate analgesic: Secondary | ICD-10-CM | POA: Diagnosis not present

## 2014-08-17 DIAGNOSIS — F329 Major depressive disorder, single episode, unspecified: Secondary | ICD-10-CM | POA: Diagnosis not present

## 2014-08-17 DIAGNOSIS — M80052D Age-related osteoporosis with current pathological fracture, left femur, subsequent encounter for fracture with routine healing: Secondary | ICD-10-CM | POA: Diagnosis not present

## 2014-08-17 DIAGNOSIS — N3000 Acute cystitis without hematuria: Secondary | ICD-10-CM | POA: Diagnosis not present

## 2014-08-17 DIAGNOSIS — G894 Chronic pain syndrome: Secondary | ICD-10-CM | POA: Diagnosis not present

## 2014-08-17 DIAGNOSIS — Z79899 Other long term (current) drug therapy: Secondary | ICD-10-CM | POA: Diagnosis not present

## 2014-08-23 DIAGNOSIS — Z6823 Body mass index (BMI) 23.0-23.9, adult: Secondary | ICD-10-CM | POA: Diagnosis not present

## 2014-08-23 DIAGNOSIS — F329 Major depressive disorder, single episode, unspecified: Secondary | ICD-10-CM | POA: Diagnosis not present

## 2014-08-23 DIAGNOSIS — G4700 Insomnia, unspecified: Secondary | ICD-10-CM | POA: Diagnosis not present

## 2014-08-23 DIAGNOSIS — M810 Age-related osteoporosis without current pathological fracture: Secondary | ICD-10-CM | POA: Diagnosis not present

## 2014-08-23 DIAGNOSIS — M159 Polyosteoarthritis, unspecified: Secondary | ICD-10-CM | POA: Diagnosis not present

## 2014-08-23 DIAGNOSIS — Z1239 Encounter for other screening for malignant neoplasm of breast: Secondary | ICD-10-CM | POA: Diagnosis not present

## 2014-08-23 DIAGNOSIS — G609 Hereditary and idiopathic neuropathy, unspecified: Secondary | ICD-10-CM | POA: Diagnosis not present

## 2014-08-23 DIAGNOSIS — F411 Generalized anxiety disorder: Secondary | ICD-10-CM | POA: Diagnosis not present

## 2014-08-23 DIAGNOSIS — R609 Edema, unspecified: Secondary | ICD-10-CM | POA: Diagnosis not present

## 2014-08-23 DIAGNOSIS — K219 Gastro-esophageal reflux disease without esophagitis: Secondary | ICD-10-CM | POA: Diagnosis not present

## 2014-08-31 DIAGNOSIS — Z1231 Encounter for screening mammogram for malignant neoplasm of breast: Secondary | ICD-10-CM | POA: Diagnosis not present

## 2014-09-06 DIAGNOSIS — F329 Major depressive disorder, single episode, unspecified: Secondary | ICD-10-CM | POA: Diagnosis not present

## 2014-09-06 DIAGNOSIS — M159 Polyosteoarthritis, unspecified: Secondary | ICD-10-CM | POA: Diagnosis not present

## 2014-09-20 DIAGNOSIS — R609 Edema, unspecified: Secondary | ICD-10-CM | POA: Diagnosis not present

## 2014-09-20 DIAGNOSIS — K219 Gastro-esophageal reflux disease without esophagitis: Secondary | ICD-10-CM | POA: Diagnosis not present

## 2014-09-20 DIAGNOSIS — G4700 Insomnia, unspecified: Secondary | ICD-10-CM | POA: Diagnosis not present

## 2014-09-20 DIAGNOSIS — G609 Hereditary and idiopathic neuropathy, unspecified: Secondary | ICD-10-CM | POA: Diagnosis not present

## 2014-09-20 DIAGNOSIS — M159 Polyosteoarthritis, unspecified: Secondary | ICD-10-CM | POA: Diagnosis not present

## 2014-09-20 DIAGNOSIS — Z6822 Body mass index (BMI) 22.0-22.9, adult: Secondary | ICD-10-CM | POA: Diagnosis not present

## 2014-09-20 DIAGNOSIS — F329 Major depressive disorder, single episode, unspecified: Secondary | ICD-10-CM | POA: Diagnosis not present

## 2014-09-20 DIAGNOSIS — F411 Generalized anxiety disorder: Secondary | ICD-10-CM | POA: Diagnosis not present

## 2014-09-20 DIAGNOSIS — M810 Age-related osteoporosis without current pathological fracture: Secondary | ICD-10-CM | POA: Diagnosis not present

## 2014-10-13 DIAGNOSIS — Z96649 Presence of unspecified artificial hip joint: Secondary | ICD-10-CM | POA: Diagnosis not present

## 2014-10-13 DIAGNOSIS — S72043A Displaced fracture of base of neck of unspecified femur, initial encounter for closed fracture: Secondary | ICD-10-CM | POA: Diagnosis not present

## 2014-10-18 DIAGNOSIS — G609 Hereditary and idiopathic neuropathy, unspecified: Secondary | ICD-10-CM | POA: Diagnosis not present

## 2014-10-18 DIAGNOSIS — M810 Age-related osteoporosis without current pathological fracture: Secondary | ICD-10-CM | POA: Diagnosis not present

## 2014-10-18 DIAGNOSIS — F411 Generalized anxiety disorder: Secondary | ICD-10-CM | POA: Diagnosis not present

## 2014-10-18 DIAGNOSIS — R609 Edema, unspecified: Secondary | ICD-10-CM | POA: Diagnosis not present

## 2014-10-18 DIAGNOSIS — M159 Polyosteoarthritis, unspecified: Secondary | ICD-10-CM | POA: Diagnosis not present

## 2014-10-18 DIAGNOSIS — Z6822 Body mass index (BMI) 22.0-22.9, adult: Secondary | ICD-10-CM | POA: Diagnosis not present

## 2014-10-18 DIAGNOSIS — G4700 Insomnia, unspecified: Secondary | ICD-10-CM | POA: Diagnosis not present

## 2014-10-18 DIAGNOSIS — K219 Gastro-esophageal reflux disease without esophagitis: Secondary | ICD-10-CM | POA: Diagnosis not present

## 2014-10-18 DIAGNOSIS — F329 Major depressive disorder, single episode, unspecified: Secondary | ICD-10-CM | POA: Diagnosis not present

## 2014-10-21 DIAGNOSIS — M25561 Pain in right knee: Secondary | ICD-10-CM | POA: Diagnosis not present

## 2014-10-21 DIAGNOSIS — M25511 Pain in right shoulder: Secondary | ICD-10-CM | POA: Diagnosis not present

## 2014-10-21 DIAGNOSIS — G8929 Other chronic pain: Secondary | ICD-10-CM | POA: Diagnosis not present

## 2014-10-26 DIAGNOSIS — M25561 Pain in right knee: Secondary | ICD-10-CM | POA: Diagnosis not present

## 2014-10-26 DIAGNOSIS — M25511 Pain in right shoulder: Secondary | ICD-10-CM | POA: Diagnosis not present

## 2014-11-14 DIAGNOSIS — Z79891 Long term (current) use of opiate analgesic: Secondary | ICD-10-CM | POA: Diagnosis not present

## 2014-11-14 DIAGNOSIS — G894 Chronic pain syndrome: Secondary | ICD-10-CM | POA: Diagnosis not present

## 2014-11-14 DIAGNOSIS — M961 Postlaminectomy syndrome, not elsewhere classified: Secondary | ICD-10-CM | POA: Diagnosis not present

## 2014-11-15 DIAGNOSIS — K219 Gastro-esophageal reflux disease without esophagitis: Secondary | ICD-10-CM | POA: Diagnosis not present

## 2014-11-15 DIAGNOSIS — G4700 Insomnia, unspecified: Secondary | ICD-10-CM | POA: Diagnosis not present

## 2014-11-15 DIAGNOSIS — G609 Hereditary and idiopathic neuropathy, unspecified: Secondary | ICD-10-CM | POA: Diagnosis not present

## 2014-11-15 DIAGNOSIS — M159 Polyosteoarthritis, unspecified: Secondary | ICD-10-CM | POA: Diagnosis not present

## 2014-11-15 DIAGNOSIS — F329 Major depressive disorder, single episode, unspecified: Secondary | ICD-10-CM | POA: Diagnosis not present

## 2014-11-15 DIAGNOSIS — M810 Age-related osteoporosis without current pathological fracture: Secondary | ICD-10-CM | POA: Diagnosis not present

## 2014-11-15 DIAGNOSIS — Z6822 Body mass index (BMI) 22.0-22.9, adult: Secondary | ICD-10-CM | POA: Diagnosis not present

## 2014-11-15 DIAGNOSIS — R609 Edema, unspecified: Secondary | ICD-10-CM | POA: Diagnosis not present

## 2014-11-15 DIAGNOSIS — F411 Generalized anxiety disorder: Secondary | ICD-10-CM | POA: Diagnosis not present

## 2014-11-22 DIAGNOSIS — S83281A Other tear of lateral meniscus, current injury, right knee, initial encounter: Secondary | ICD-10-CM | POA: Diagnosis not present

## 2014-11-22 DIAGNOSIS — X58XXXA Exposure to other specified factors, initial encounter: Secondary | ICD-10-CM | POA: Diagnosis not present

## 2014-11-22 DIAGNOSIS — S83241A Other tear of medial meniscus, current injury, right knee, initial encounter: Secondary | ICD-10-CM | POA: Diagnosis not present

## 2014-11-22 DIAGNOSIS — M659 Synovitis and tenosynovitis, unspecified: Secondary | ICD-10-CM | POA: Diagnosis not present

## 2014-11-22 DIAGNOSIS — S83261A Peripheral tear of lateral meniscus, current injury, right knee, initial encounter: Secondary | ICD-10-CM | POA: Diagnosis not present

## 2014-11-22 DIAGNOSIS — M94261 Chondromalacia, right knee: Secondary | ICD-10-CM | POA: Diagnosis not present

## 2014-11-22 DIAGNOSIS — S83231A Complex tear of medial meniscus, current injury, right knee, initial encounter: Secondary | ICD-10-CM | POA: Diagnosis not present

## 2014-11-22 DIAGNOSIS — G8918 Other acute postprocedural pain: Secondary | ICD-10-CM | POA: Diagnosis not present

## 2014-11-22 DIAGNOSIS — Y929 Unspecified place or not applicable: Secondary | ICD-10-CM | POA: Diagnosis not present

## 2014-12-06 DIAGNOSIS — S83241D Other tear of medial meniscus, current injury, right knee, subsequent encounter: Secondary | ICD-10-CM | POA: Diagnosis not present

## 2014-12-12 DIAGNOSIS — Z4789 Encounter for other orthopedic aftercare: Secondary | ICD-10-CM | POA: Diagnosis not present

## 2014-12-12 DIAGNOSIS — M25661 Stiffness of right knee, not elsewhere classified: Secondary | ICD-10-CM | POA: Diagnosis not present

## 2014-12-12 DIAGNOSIS — R293 Abnormal posture: Secondary | ICD-10-CM | POA: Diagnosis not present

## 2014-12-12 DIAGNOSIS — M25461 Effusion, right knee: Secondary | ICD-10-CM | POA: Diagnosis not present

## 2014-12-12 DIAGNOSIS — M6281 Muscle weakness (generalized): Secondary | ICD-10-CM | POA: Diagnosis not present

## 2014-12-12 DIAGNOSIS — R2689 Other abnormalities of gait and mobility: Secondary | ICD-10-CM | POA: Diagnosis not present

## 2014-12-12 DIAGNOSIS — M79604 Pain in right leg: Secondary | ICD-10-CM | POA: Diagnosis not present

## 2014-12-13 DIAGNOSIS — M810 Age-related osteoporosis without current pathological fracture: Secondary | ICD-10-CM | POA: Diagnosis not present

## 2014-12-13 DIAGNOSIS — R609 Edema, unspecified: Secondary | ICD-10-CM | POA: Diagnosis not present

## 2014-12-13 DIAGNOSIS — F411 Generalized anxiety disorder: Secondary | ICD-10-CM | POA: Diagnosis not present

## 2014-12-13 DIAGNOSIS — M159 Polyosteoarthritis, unspecified: Secondary | ICD-10-CM | POA: Diagnosis not present

## 2014-12-13 DIAGNOSIS — Z6821 Body mass index (BMI) 21.0-21.9, adult: Secondary | ICD-10-CM | POA: Diagnosis not present

## 2014-12-13 DIAGNOSIS — G4700 Insomnia, unspecified: Secondary | ICD-10-CM | POA: Diagnosis not present

## 2014-12-13 DIAGNOSIS — F329 Major depressive disorder, single episode, unspecified: Secondary | ICD-10-CM | POA: Diagnosis not present

## 2014-12-13 DIAGNOSIS — K219 Gastro-esophageal reflux disease without esophagitis: Secondary | ICD-10-CM | POA: Diagnosis not present

## 2014-12-13 DIAGNOSIS — G609 Hereditary and idiopathic neuropathy, unspecified: Secondary | ICD-10-CM | POA: Diagnosis not present

## 2014-12-15 DIAGNOSIS — Z4789 Encounter for other orthopedic aftercare: Secondary | ICD-10-CM | POA: Diagnosis not present

## 2014-12-15 DIAGNOSIS — M6281 Muscle weakness (generalized): Secondary | ICD-10-CM | POA: Diagnosis not present

## 2014-12-15 DIAGNOSIS — M25661 Stiffness of right knee, not elsewhere classified: Secondary | ICD-10-CM | POA: Diagnosis not present

## 2014-12-15 DIAGNOSIS — M25461 Effusion, right knee: Secondary | ICD-10-CM | POA: Diagnosis not present

## 2014-12-15 DIAGNOSIS — R293 Abnormal posture: Secondary | ICD-10-CM | POA: Diagnosis not present

## 2014-12-15 DIAGNOSIS — M79604 Pain in right leg: Secondary | ICD-10-CM | POA: Diagnosis not present

## 2014-12-21 DIAGNOSIS — M79604 Pain in right leg: Secondary | ICD-10-CM | POA: Diagnosis not present

## 2014-12-21 DIAGNOSIS — Z4789 Encounter for other orthopedic aftercare: Secondary | ICD-10-CM | POA: Diagnosis not present

## 2014-12-21 DIAGNOSIS — R293 Abnormal posture: Secondary | ICD-10-CM | POA: Diagnosis not present

## 2014-12-21 DIAGNOSIS — M25661 Stiffness of right knee, not elsewhere classified: Secondary | ICD-10-CM | POA: Diagnosis not present

## 2014-12-21 DIAGNOSIS — M25461 Effusion, right knee: Secondary | ICD-10-CM | POA: Diagnosis not present

## 2014-12-21 DIAGNOSIS — M6281 Muscle weakness (generalized): Secondary | ICD-10-CM | POA: Diagnosis not present

## 2014-12-26 DIAGNOSIS — M79604 Pain in right leg: Secondary | ICD-10-CM | POA: Diagnosis not present

## 2014-12-26 DIAGNOSIS — R293 Abnormal posture: Secondary | ICD-10-CM | POA: Diagnosis not present

## 2014-12-26 DIAGNOSIS — M25661 Stiffness of right knee, not elsewhere classified: Secondary | ICD-10-CM | POA: Diagnosis not present

## 2014-12-26 DIAGNOSIS — Z4789 Encounter for other orthopedic aftercare: Secondary | ICD-10-CM | POA: Diagnosis not present

## 2014-12-26 DIAGNOSIS — M6281 Muscle weakness (generalized): Secondary | ICD-10-CM | POA: Diagnosis not present

## 2014-12-26 DIAGNOSIS — R2689 Other abnormalities of gait and mobility: Secondary | ICD-10-CM | POA: Diagnosis not present

## 2014-12-26 DIAGNOSIS — M25461 Effusion, right knee: Secondary | ICD-10-CM | POA: Diagnosis not present

## 2014-12-28 DIAGNOSIS — Z4789 Encounter for other orthopedic aftercare: Secondary | ICD-10-CM | POA: Diagnosis not present

## 2014-12-28 DIAGNOSIS — M25461 Effusion, right knee: Secondary | ICD-10-CM | POA: Diagnosis not present

## 2014-12-28 DIAGNOSIS — M6281 Muscle weakness (generalized): Secondary | ICD-10-CM | POA: Diagnosis not present

## 2014-12-28 DIAGNOSIS — M25661 Stiffness of right knee, not elsewhere classified: Secondary | ICD-10-CM | POA: Diagnosis not present

## 2014-12-28 DIAGNOSIS — M79604 Pain in right leg: Secondary | ICD-10-CM | POA: Diagnosis not present

## 2014-12-28 DIAGNOSIS — R293 Abnormal posture: Secondary | ICD-10-CM | POA: Diagnosis not present

## 2015-01-02 DIAGNOSIS — Z4789 Encounter for other orthopedic aftercare: Secondary | ICD-10-CM | POA: Diagnosis not present

## 2015-01-02 DIAGNOSIS — M6281 Muscle weakness (generalized): Secondary | ICD-10-CM | POA: Diagnosis not present

## 2015-01-02 DIAGNOSIS — M25661 Stiffness of right knee, not elsewhere classified: Secondary | ICD-10-CM | POA: Diagnosis not present

## 2015-01-02 DIAGNOSIS — R293 Abnormal posture: Secondary | ICD-10-CM | POA: Diagnosis not present

## 2015-01-02 DIAGNOSIS — M25461 Effusion, right knee: Secondary | ICD-10-CM | POA: Diagnosis not present

## 2015-01-02 DIAGNOSIS — M79604 Pain in right leg: Secondary | ICD-10-CM | POA: Diagnosis not present

## 2015-01-03 DIAGNOSIS — S83281D Other tear of lateral meniscus, current injury, right knee, subsequent encounter: Secondary | ICD-10-CM | POA: Diagnosis not present

## 2015-01-03 DIAGNOSIS — Z4789 Encounter for other orthopedic aftercare: Secondary | ICD-10-CM | POA: Diagnosis not present

## 2015-01-03 DIAGNOSIS — S83241D Other tear of medial meniscus, current injury, right knee, subsequent encounter: Secondary | ICD-10-CM | POA: Diagnosis not present

## 2015-01-04 DIAGNOSIS — M79604 Pain in right leg: Secondary | ICD-10-CM | POA: Diagnosis not present

## 2015-01-04 DIAGNOSIS — M25661 Stiffness of right knee, not elsewhere classified: Secondary | ICD-10-CM | POA: Diagnosis not present

## 2015-01-04 DIAGNOSIS — M25461 Effusion, right knee: Secondary | ICD-10-CM | POA: Diagnosis not present

## 2015-01-04 DIAGNOSIS — Z4789 Encounter for other orthopedic aftercare: Secondary | ICD-10-CM | POA: Diagnosis not present

## 2015-01-04 DIAGNOSIS — M6281 Muscle weakness (generalized): Secondary | ICD-10-CM | POA: Diagnosis not present

## 2015-01-04 DIAGNOSIS — R293 Abnormal posture: Secondary | ICD-10-CM | POA: Diagnosis not present

## 2015-01-09 DIAGNOSIS — Z4789 Encounter for other orthopedic aftercare: Secondary | ICD-10-CM | POA: Diagnosis not present

## 2015-01-09 DIAGNOSIS — M25661 Stiffness of right knee, not elsewhere classified: Secondary | ICD-10-CM | POA: Diagnosis not present

## 2015-01-09 DIAGNOSIS — M6281 Muscle weakness (generalized): Secondary | ICD-10-CM | POA: Diagnosis not present

## 2015-01-09 DIAGNOSIS — M25461 Effusion, right knee: Secondary | ICD-10-CM | POA: Diagnosis not present

## 2015-01-09 DIAGNOSIS — R293 Abnormal posture: Secondary | ICD-10-CM | POA: Diagnosis not present

## 2015-01-09 DIAGNOSIS — M79604 Pain in right leg: Secondary | ICD-10-CM | POA: Diagnosis not present

## 2015-01-11 DIAGNOSIS — R293 Abnormal posture: Secondary | ICD-10-CM | POA: Diagnosis not present

## 2015-01-11 DIAGNOSIS — M79604 Pain in right leg: Secondary | ICD-10-CM | POA: Diagnosis not present

## 2015-01-11 DIAGNOSIS — M6281 Muscle weakness (generalized): Secondary | ICD-10-CM | POA: Diagnosis not present

## 2015-01-11 DIAGNOSIS — M25661 Stiffness of right knee, not elsewhere classified: Secondary | ICD-10-CM | POA: Diagnosis not present

## 2015-01-11 DIAGNOSIS — M25461 Effusion, right knee: Secondary | ICD-10-CM | POA: Diagnosis not present

## 2015-01-11 DIAGNOSIS — Z4789 Encounter for other orthopedic aftercare: Secondary | ICD-10-CM | POA: Diagnosis not present

## 2015-01-12 DIAGNOSIS — Z23 Encounter for immunization: Secondary | ICD-10-CM | POA: Diagnosis not present

## 2015-01-12 DIAGNOSIS — F411 Generalized anxiety disorder: Secondary | ICD-10-CM | POA: Diagnosis not present

## 2015-01-12 DIAGNOSIS — M810 Age-related osteoporosis without current pathological fracture: Secondary | ICD-10-CM | POA: Diagnosis not present

## 2015-01-12 DIAGNOSIS — F329 Major depressive disorder, single episode, unspecified: Secondary | ICD-10-CM | POA: Diagnosis not present

## 2015-01-12 DIAGNOSIS — R609 Edema, unspecified: Secondary | ICD-10-CM | POA: Diagnosis not present

## 2015-01-12 DIAGNOSIS — Z6821 Body mass index (BMI) 21.0-21.9, adult: Secondary | ICD-10-CM | POA: Diagnosis not present

## 2015-01-12 DIAGNOSIS — G4700 Insomnia, unspecified: Secondary | ICD-10-CM | POA: Diagnosis not present

## 2015-01-12 DIAGNOSIS — G609 Hereditary and idiopathic neuropathy, unspecified: Secondary | ICD-10-CM | POA: Diagnosis not present

## 2015-01-12 DIAGNOSIS — M159 Polyosteoarthritis, unspecified: Secondary | ICD-10-CM | POA: Diagnosis not present

## 2015-01-12 DIAGNOSIS — K219 Gastro-esophageal reflux disease without esophagitis: Secondary | ICD-10-CM | POA: Diagnosis not present

## 2015-01-16 DIAGNOSIS — M25461 Effusion, right knee: Secondary | ICD-10-CM | POA: Diagnosis not present

## 2015-01-16 DIAGNOSIS — R293 Abnormal posture: Secondary | ICD-10-CM | POA: Diagnosis not present

## 2015-01-16 DIAGNOSIS — M6281 Muscle weakness (generalized): Secondary | ICD-10-CM | POA: Diagnosis not present

## 2015-01-16 DIAGNOSIS — M79604 Pain in right leg: Secondary | ICD-10-CM | POA: Diagnosis not present

## 2015-01-16 DIAGNOSIS — M25661 Stiffness of right knee, not elsewhere classified: Secondary | ICD-10-CM | POA: Diagnosis not present

## 2015-01-16 DIAGNOSIS — Z4789 Encounter for other orthopedic aftercare: Secondary | ICD-10-CM | POA: Diagnosis not present

## 2015-01-18 DIAGNOSIS — R293 Abnormal posture: Secondary | ICD-10-CM | POA: Diagnosis not present

## 2015-01-18 DIAGNOSIS — M25661 Stiffness of right knee, not elsewhere classified: Secondary | ICD-10-CM | POA: Diagnosis not present

## 2015-01-18 DIAGNOSIS — Z4789 Encounter for other orthopedic aftercare: Secondary | ICD-10-CM | POA: Diagnosis not present

## 2015-01-18 DIAGNOSIS — M79604 Pain in right leg: Secondary | ICD-10-CM | POA: Diagnosis not present

## 2015-01-18 DIAGNOSIS — M25461 Effusion, right knee: Secondary | ICD-10-CM | POA: Diagnosis not present

## 2015-01-18 DIAGNOSIS — M6281 Muscle weakness (generalized): Secondary | ICD-10-CM | POA: Diagnosis not present

## 2015-01-23 DIAGNOSIS — R293 Abnormal posture: Secondary | ICD-10-CM | POA: Diagnosis not present

## 2015-01-23 DIAGNOSIS — M25661 Stiffness of right knee, not elsewhere classified: Secondary | ICD-10-CM | POA: Diagnosis not present

## 2015-01-23 DIAGNOSIS — M25461 Effusion, right knee: Secondary | ICD-10-CM | POA: Diagnosis not present

## 2015-01-23 DIAGNOSIS — M6281 Muscle weakness (generalized): Secondary | ICD-10-CM | POA: Diagnosis not present

## 2015-01-23 DIAGNOSIS — Z4789 Encounter for other orthopedic aftercare: Secondary | ICD-10-CM | POA: Diagnosis not present

## 2015-01-23 DIAGNOSIS — M79604 Pain in right leg: Secondary | ICD-10-CM | POA: Diagnosis not present

## 2015-01-23 DIAGNOSIS — R2689 Other abnormalities of gait and mobility: Secondary | ICD-10-CM | POA: Diagnosis not present

## 2015-01-25 DIAGNOSIS — M25461 Effusion, right knee: Secondary | ICD-10-CM | POA: Diagnosis not present

## 2015-01-25 DIAGNOSIS — M79604 Pain in right leg: Secondary | ICD-10-CM | POA: Diagnosis not present

## 2015-01-25 DIAGNOSIS — Z4789 Encounter for other orthopedic aftercare: Secondary | ICD-10-CM | POA: Diagnosis not present

## 2015-01-25 DIAGNOSIS — M25661 Stiffness of right knee, not elsewhere classified: Secondary | ICD-10-CM | POA: Diagnosis not present

## 2015-01-25 DIAGNOSIS — R293 Abnormal posture: Secondary | ICD-10-CM | POA: Diagnosis not present

## 2015-01-25 DIAGNOSIS — M6281 Muscle weakness (generalized): Secondary | ICD-10-CM | POA: Diagnosis not present

## 2015-01-30 DIAGNOSIS — M79604 Pain in right leg: Secondary | ICD-10-CM | POA: Diagnosis not present

## 2015-01-30 DIAGNOSIS — M25661 Stiffness of right knee, not elsewhere classified: Secondary | ICD-10-CM | POA: Diagnosis not present

## 2015-01-30 DIAGNOSIS — Z4789 Encounter for other orthopedic aftercare: Secondary | ICD-10-CM | POA: Diagnosis not present

## 2015-01-30 DIAGNOSIS — R293 Abnormal posture: Secondary | ICD-10-CM | POA: Diagnosis not present

## 2015-01-30 DIAGNOSIS — M6281 Muscle weakness (generalized): Secondary | ICD-10-CM | POA: Diagnosis not present

## 2015-01-30 DIAGNOSIS — M25461 Effusion, right knee: Secondary | ICD-10-CM | POA: Diagnosis not present

## 2015-01-31 DIAGNOSIS — Z4789 Encounter for other orthopedic aftercare: Secondary | ICD-10-CM | POA: Diagnosis not present

## 2015-02-02 DIAGNOSIS — M6281 Muscle weakness (generalized): Secondary | ICD-10-CM | POA: Diagnosis not present

## 2015-02-02 DIAGNOSIS — Z4789 Encounter for other orthopedic aftercare: Secondary | ICD-10-CM | POA: Diagnosis not present

## 2015-02-02 DIAGNOSIS — M25661 Stiffness of right knee, not elsewhere classified: Secondary | ICD-10-CM | POA: Diagnosis not present

## 2015-02-02 DIAGNOSIS — M25461 Effusion, right knee: Secondary | ICD-10-CM | POA: Diagnosis not present

## 2015-02-02 DIAGNOSIS — M79604 Pain in right leg: Secondary | ICD-10-CM | POA: Diagnosis not present

## 2015-02-02 DIAGNOSIS — R293 Abnormal posture: Secondary | ICD-10-CM | POA: Diagnosis not present

## 2015-02-06 DIAGNOSIS — M961 Postlaminectomy syndrome, not elsewhere classified: Secondary | ICD-10-CM | POA: Diagnosis not present

## 2015-02-06 DIAGNOSIS — Z79891 Long term (current) use of opiate analgesic: Secondary | ICD-10-CM | POA: Diagnosis not present

## 2015-02-06 DIAGNOSIS — G894 Chronic pain syndrome: Secondary | ICD-10-CM | POA: Diagnosis not present

## 2015-02-07 DIAGNOSIS — M25661 Stiffness of right knee, not elsewhere classified: Secondary | ICD-10-CM | POA: Diagnosis not present

## 2015-02-07 DIAGNOSIS — R293 Abnormal posture: Secondary | ICD-10-CM | POA: Diagnosis not present

## 2015-02-07 DIAGNOSIS — Z4789 Encounter for other orthopedic aftercare: Secondary | ICD-10-CM | POA: Diagnosis not present

## 2015-02-07 DIAGNOSIS — M6281 Muscle weakness (generalized): Secondary | ICD-10-CM | POA: Diagnosis not present

## 2015-02-07 DIAGNOSIS — M25461 Effusion, right knee: Secondary | ICD-10-CM | POA: Diagnosis not present

## 2015-02-07 DIAGNOSIS — M79604 Pain in right leg: Secondary | ICD-10-CM | POA: Diagnosis not present

## 2015-02-09 DIAGNOSIS — F329 Major depressive disorder, single episode, unspecified: Secondary | ICD-10-CM | POA: Diagnosis not present

## 2015-02-09 DIAGNOSIS — Z6822 Body mass index (BMI) 22.0-22.9, adult: Secondary | ICD-10-CM | POA: Diagnosis not present

## 2015-02-09 DIAGNOSIS — G4700 Insomnia, unspecified: Secondary | ICD-10-CM | POA: Diagnosis not present

## 2015-02-09 DIAGNOSIS — M159 Polyosteoarthritis, unspecified: Secondary | ICD-10-CM | POA: Diagnosis not present

## 2015-02-09 DIAGNOSIS — F411 Generalized anxiety disorder: Secondary | ICD-10-CM | POA: Diagnosis not present

## 2015-02-09 DIAGNOSIS — K219 Gastro-esophageal reflux disease without esophagitis: Secondary | ICD-10-CM | POA: Diagnosis not present

## 2015-02-09 DIAGNOSIS — Z1211 Encounter for screening for malignant neoplasm of colon: Secondary | ICD-10-CM | POA: Diagnosis not present

## 2015-02-09 DIAGNOSIS — M810 Age-related osteoporosis without current pathological fracture: Secondary | ICD-10-CM | POA: Diagnosis not present

## 2015-02-09 DIAGNOSIS — G609 Hereditary and idiopathic neuropathy, unspecified: Secondary | ICD-10-CM | POA: Diagnosis not present

## 2015-02-09 DIAGNOSIS — R609 Edema, unspecified: Secondary | ICD-10-CM | POA: Diagnosis not present

## 2015-02-12 DIAGNOSIS — M25461 Effusion, right knee: Secondary | ICD-10-CM | POA: Diagnosis not present

## 2015-02-12 DIAGNOSIS — M79604 Pain in right leg: Secondary | ICD-10-CM | POA: Diagnosis not present

## 2015-02-12 DIAGNOSIS — R293 Abnormal posture: Secondary | ICD-10-CM | POA: Diagnosis not present

## 2015-02-12 DIAGNOSIS — M25661 Stiffness of right knee, not elsewhere classified: Secondary | ICD-10-CM | POA: Diagnosis not present

## 2015-02-12 DIAGNOSIS — M6281 Muscle weakness (generalized): Secondary | ICD-10-CM | POA: Diagnosis not present

## 2015-02-12 DIAGNOSIS — Z4789 Encounter for other orthopedic aftercare: Secondary | ICD-10-CM | POA: Diagnosis not present

## 2015-02-21 DIAGNOSIS — M25561 Pain in right knee: Secondary | ICD-10-CM | POA: Diagnosis not present

## 2015-02-21 DIAGNOSIS — Z4789 Encounter for other orthopedic aftercare: Secondary | ICD-10-CM | POA: Diagnosis not present

## 2015-02-23 DIAGNOSIS — F411 Generalized anxiety disorder: Secondary | ICD-10-CM | POA: Diagnosis not present

## 2015-02-23 DIAGNOSIS — G609 Hereditary and idiopathic neuropathy, unspecified: Secondary | ICD-10-CM | POA: Diagnosis not present

## 2015-02-23 DIAGNOSIS — G4700 Insomnia, unspecified: Secondary | ICD-10-CM | POA: Diagnosis not present

## 2015-02-23 DIAGNOSIS — K219 Gastro-esophageal reflux disease without esophagitis: Secondary | ICD-10-CM | POA: Diagnosis not present

## 2015-02-23 DIAGNOSIS — Z6822 Body mass index (BMI) 22.0-22.9, adult: Secondary | ICD-10-CM | POA: Diagnosis not present

## 2015-02-23 DIAGNOSIS — M159 Polyosteoarthritis, unspecified: Secondary | ICD-10-CM | POA: Diagnosis not present

## 2015-02-23 DIAGNOSIS — J209 Acute bronchitis, unspecified: Secondary | ICD-10-CM | POA: Diagnosis not present

## 2015-02-23 DIAGNOSIS — F329 Major depressive disorder, single episode, unspecified: Secondary | ICD-10-CM | POA: Diagnosis not present

## 2015-02-23 DIAGNOSIS — M810 Age-related osteoporosis without current pathological fracture: Secondary | ICD-10-CM | POA: Diagnosis not present

## 2015-02-23 DIAGNOSIS — R609 Edema, unspecified: Secondary | ICD-10-CM | POA: Diagnosis not present

## 2015-03-07 DIAGNOSIS — K219 Gastro-esophageal reflux disease without esophagitis: Secondary | ICD-10-CM | POA: Diagnosis not present

## 2015-03-07 DIAGNOSIS — Z800 Family history of malignant neoplasm of digestive organs: Secondary | ICD-10-CM | POA: Diagnosis not present

## 2015-03-07 DIAGNOSIS — M1711 Unilateral primary osteoarthritis, right knee: Secondary | ICD-10-CM | POA: Diagnosis not present

## 2015-03-07 DIAGNOSIS — R1310 Dysphagia, unspecified: Secondary | ICD-10-CM | POA: Diagnosis not present

## 2015-03-09 DIAGNOSIS — G4700 Insomnia, unspecified: Secondary | ICD-10-CM | POA: Diagnosis not present

## 2015-03-09 DIAGNOSIS — Z6822 Body mass index (BMI) 22.0-22.9, adult: Secondary | ICD-10-CM | POA: Diagnosis not present

## 2015-03-09 DIAGNOSIS — M810 Age-related osteoporosis without current pathological fracture: Secondary | ICD-10-CM | POA: Diagnosis not present

## 2015-03-09 DIAGNOSIS — F329 Major depressive disorder, single episode, unspecified: Secondary | ICD-10-CM | POA: Diagnosis not present

## 2015-03-09 DIAGNOSIS — M159 Polyosteoarthritis, unspecified: Secondary | ICD-10-CM | POA: Diagnosis not present

## 2015-03-09 DIAGNOSIS — K219 Gastro-esophageal reflux disease without esophagitis: Secondary | ICD-10-CM | POA: Diagnosis not present

## 2015-03-09 DIAGNOSIS — G609 Hereditary and idiopathic neuropathy, unspecified: Secondary | ICD-10-CM | POA: Diagnosis not present

## 2015-03-09 DIAGNOSIS — R609 Edema, unspecified: Secondary | ICD-10-CM | POA: Diagnosis not present

## 2015-03-09 DIAGNOSIS — F411 Generalized anxiety disorder: Secondary | ICD-10-CM | POA: Diagnosis not present

## 2015-03-13 DIAGNOSIS — F329 Major depressive disorder, single episode, unspecified: Secondary | ICD-10-CM | POA: Diagnosis not present

## 2015-03-13 DIAGNOSIS — G609 Hereditary and idiopathic neuropathy, unspecified: Secondary | ICD-10-CM | POA: Diagnosis not present

## 2015-03-13 DIAGNOSIS — Z01810 Encounter for preprocedural cardiovascular examination: Secondary | ICD-10-CM | POA: Diagnosis not present

## 2015-03-13 DIAGNOSIS — K219 Gastro-esophageal reflux disease without esophagitis: Secondary | ICD-10-CM | POA: Diagnosis not present

## 2015-03-13 DIAGNOSIS — G4700 Insomnia, unspecified: Secondary | ICD-10-CM | POA: Diagnosis not present

## 2015-03-13 DIAGNOSIS — R609 Edema, unspecified: Secondary | ICD-10-CM | POA: Diagnosis not present

## 2015-03-13 DIAGNOSIS — F411 Generalized anxiety disorder: Secondary | ICD-10-CM | POA: Diagnosis not present

## 2015-03-13 DIAGNOSIS — M159 Polyosteoarthritis, unspecified: Secondary | ICD-10-CM | POA: Diagnosis not present

## 2015-03-13 DIAGNOSIS — Z6822 Body mass index (BMI) 22.0-22.9, adult: Secondary | ICD-10-CM | POA: Diagnosis not present

## 2015-03-13 DIAGNOSIS — M810 Age-related osteoporosis without current pathological fracture: Secondary | ICD-10-CM | POA: Diagnosis not present

## 2015-03-15 DIAGNOSIS — Z86010 Personal history of colonic polyps: Secondary | ICD-10-CM | POA: Diagnosis not present

## 2015-03-15 DIAGNOSIS — Z800 Family history of malignant neoplasm of digestive organs: Secondary | ICD-10-CM | POA: Diagnosis not present

## 2015-03-15 DIAGNOSIS — D126 Benign neoplasm of colon, unspecified: Secondary | ICD-10-CM | POA: Diagnosis not present

## 2015-03-15 DIAGNOSIS — Z9049 Acquired absence of other specified parts of digestive tract: Secondary | ICD-10-CM | POA: Diagnosis not present

## 2015-03-15 DIAGNOSIS — K621 Rectal polyp: Secondary | ICD-10-CM | POA: Diagnosis not present

## 2015-03-15 DIAGNOSIS — K5730 Diverticulosis of large intestine without perforation or abscess without bleeding: Secondary | ICD-10-CM | POA: Diagnosis not present

## 2015-03-15 DIAGNOSIS — Z79899 Other long term (current) drug therapy: Secondary | ICD-10-CM | POA: Diagnosis not present

## 2015-03-15 DIAGNOSIS — Z1211 Encounter for screening for malignant neoplasm of colon: Secondary | ICD-10-CM | POA: Diagnosis not present

## 2015-03-28 NOTE — H&P (Signed)
TOTAL KNEE ADMISSION H&P  Patient is being admitted for right total knee arthroplasty.  Subjective:  Chief Complaint:    Right knee primary OA / pain  HPI: Amanda Guerrero, 65 y.o. female, has a history of pain and functional disability in the right knee due to arthritis and has failed non-surgical conservative treatments for greater than 12 weeks to include NSAID's and/or analgesics, corticosteriod injections, use of assistive devices and activity modification.  Onset of symptoms was gradual, starting ~1 years ago with gradually worsening course since that time. The patient noted prior procedures on the knee to include  arthroscopy on the right knee(s).  Patient currently rates pain in the right knee(s) at 8 out of 10 with activity. Patient has night pain, worsening of pain with activity and weight bearing, pain that interferes with activities of daily living, pain with passive range of motion, crepitus and joint swelling.  Patient has evidence of periarticular osteophytes and joint space narrowing by imaging studies.  There is no active infection.   Risks, benefits and expectations were discussed with the patient.  Risks including but not limited to the risk of anesthesia, blood clots, nerve damage, blood vessel damage, failure of the prosthesis, infection and up to and including death.  Patient understand the risks, benefits and expectations and wishes to proceed with surgery.   PCP: Cher Nakai, MD  D/C Plans:      Home with OPPT  Post-op Meds:       No Rx given   Tranexamic Acid:      To be given - IV   Decadron:      Is to be given  FYI:     ASA post-op  Continue Morphine  Increase Oxycodone  No DME needs  Patient planning on going straight to OPPT,  No HHPT     Patient Active Problem List   Diagnosis Date Noted  . Preoperative examination, unspecified 10/26/2012   Past Medical History  Diagnosis Date  . 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 Peripheral edema     takes Furosemide  daily  . History of migraine     last one about 41months ago  . Weakness     and numbness both hands  . Peripheral neuropathy     takes Lyrica bid  . Arthritis   . Joint pain   . Chronic back pain   . Bruises easily   . GERD (gastroesophageal reflux disease)     takes Nexium bid  . Constipation     r/t pain meds;takes OTC stool softener  . History of colon polyps   . Urinary frequency   . Urinary urgency   . History of blood transfusion     no abnormal reaction noted  . Depression     takes Celexa daily  . Anxiety     takes Klonopin tid  . Insomnia     takes Lunesta nightly  . History of MRSA infection 2009  . History of staph infection 2009    Past Surgical History  Procedure Laterality Date  . Knee surgery  Feb. 2014    Left knee  . Joint replacement Right 2006    Hip-Total  . Appendectomy  1980  . Wrist surgery Right   . Carpal tunnel release Bilateral   . Tarsal tunnel release Bilateral   . Abdominal hysterectomy      Total  . Rotator cuff repair Left     Shoulder  . Bunionectomy Right  foot  . Spine surgery      Upper and lower fusion  . Tubal ligation    . Colonoscopy    . Esophagogastroduodenoscopy    . Anterior lumbar fusion N/A 11/03/2012    Procedure: ALIF L5-S1;  Surgeon: Melina Schools, MD;  Location: [facility];  Service: Orthopedics;  Laterality: N/A;  . Abdominal exposure N/A 11/03/2012    Procedure: ABDOMINAL EXPOSURE FOR LUMBAR SURGERY;  Surgeon: Rosetta Posner, MD;  Location: [facility] OR;  Service: Vascular;  Laterality: N/A;    No prescriptions prior to admission   Allergies  Allergen Reactions  . Meloxicam Itching and Swelling  . Tramadol Other (See Comments)    cramping    Social History  Substance Use Topics  . Smoking status: Former Smoker -- 1.00 packs/day for 7 years    Types: Cigarettes    Quit date: 09/30/2004  . Smokeless tobacco: Never Used  . Alcohol Use: No    Family History  Problem Relation Age of Onset  . Heart disease  Mother   . Hyperlipidemia Mother   . Hypertension Mother   . Deep vein thrombosis Mother   . Heart attack Mother   . Cancer Father   . Hyperlipidemia Father   . Hypertension Father   . Deep vein thrombosis Father   . Arthritis Father   . Arthritis Maternal Uncle   . Arthritis Paternal Aunt      Review of Systems  Constitutional: Positive for malaise/fatigue.  Eyes: Negative.   Respiratory: Negative.   Cardiovascular: Negative.   Gastrointestinal: Positive for heartburn and constipation.  Genitourinary: Negative.   Musculoskeletal: Positive for back pain and joint pain.  Skin: Negative.   Neurological: Positive for headaches.  Endo/Heme/Allergies: Negative.   Psychiatric/Behavioral: Positive for depression. The patient is nervous/anxious and has insomnia.     Objective:  Physical Exam  Constitutional: She is oriented to person, place, and time. She appears well-developed.  HENT:  Head: Normocephalic.  Eyes: Pupils are equal, round, and reactive to light.  Neck: Neck supple. No JVD present. No tracheal deviation present. No thyromegaly present.  Cardiovascular: Normal rate, regular rhythm, normal heart sounds and intact distal pulses.   Respiratory: Effort normal and breath sounds normal. No stridor. No respiratory distress. She has no wheezes.  GI: Soft. There is no tenderness. There is no guarding.  Musculoskeletal:       Right knee: She exhibits decreased range of motion, swelling and bony tenderness. She exhibits no ecchymosis, no deformity, no laceration and no erythema. Tenderness found.  Lymphadenopathy:    She has no cervical adenopathy.  Neurological: She is alert and oriented to person, place, and time. A sensory deficit (Right foot numbness and tingling) is present.  Skin: Skin is warm and dry.  Psychiatric: She has a normal mood and affect.     Labs:  Estimated body mass index is 22.90 kg/(m^2) as calculated from the following:   Height as of 11/01/12: 5'  5" (1.651 m).   Weight as of 11/01/12: 62.415 kg (137 lb 9.6 oz).   Imaging Review Plain radiographs demonstrate severe degenerative joint disease of the right knee(s). The overall alignment is mild valgus. The bone quality appears to be good for age and reported activity level.  Assessment/Plan:  End stage arthritis, right knee   The patient history, physical examination, clinical judgment of the provider and imaging studies are consistent with end stage degenerative joint disease of the right knee(s) and total knee arthroplasty is deemed  medically necessary. The treatment options including medical management, injection therapy arthroscopy and arthroplasty were discussed at length. The risks and benefits of total knee arthroplasty were presented and reviewed. The risks due to aseptic loosening, infection, stiffness, patella tracking problems, thromboembolic complications and other imponderables were discussed. The patient acknowledged the explanation, agreed to proceed with the plan and consent was signed. Patient is being admitted for inpatient treatment for surgery, pain control, PT, OT, prophylactic antibiotics, VTE prophylaxis, progressive ambulation and ADL's and discharge planning. The patient is planning to be discharged home with OPPT.       [place] Alecxis Baltzell   PA-C  03/28/2015, 1:03 PM

## 2015-03-29 DIAGNOSIS — Z96649 Presence of unspecified artificial hip joint: Secondary | ICD-10-CM | POA: Diagnosis not present

## 2015-03-29 DIAGNOSIS — S72043A Displaced fracture of base of neck of unspecified femur, initial encounter for closed fracture: Secondary | ICD-10-CM | POA: Diagnosis not present

## 2015-04-02 NOTE — Patient Instructions (Addendum)
Amanda Guerrero  04/02/2015   Your procedure is scheduled on: 04/09/2015    Report to [facility] Main  Entrance take [place]  elevators to 3rd floor to  [place] at   [place] AM.  Call this number if you have problems the morning of surgery [phone]   Remember: ONLY 1 PERSON MAY GO WITH YOU TO SHORT STAY TO GET  READY MORNING OF YOUR SURGERY.  Do not eat food or drink liquids :After Midnight.     Take these medicines the morning of surgery with A SIP OF WATER:   Celexa, Klonopin( Clonazepam), MS Contin, protonix , Lyrica                                 You may not have any metal on your body including hair pins and              piercings  Do not wear jewelry, make-up, lotions, powders or perfumes, deodorant             Do not wear nail polish.  Do not shave  48 hours prior to surgery.              Do not bring valuables to the hospital. [place].  Contacts, dentures or bridgework may not be worn into surgery.  Leave suitcase in the car. After surgery it may be brought to your room.       Special Instructions: coughing and deep breathing exercises, leg exercises               Please read over the following fact sheets you were given: _____________________________________________________________________             [facility] - Preparing for Surgery Before surgery, you can play an important role.  Because skin is not sterile, your skin needs to be as free of germs as possible.  You can reduce the number of germs on your skin by washing with CHG (chlorahexidine gluconate) soap before surgery.  CHG is an antiseptic cleaner which kills germs and bonds with the skin to continue killing germs even after washing. Please DO NOT use if you have an allergy to CHG or antibacterial soaps.  If your skin becomes reddened/irritated stop using the CHG and inform your nurse when you arrive at Short Stay. Do not shave  (including legs and underarms) for at least 48 hours prior to the first CHG shower.  You may shave your face/neck. Please follow these instructions carefully:  1.  Shower with CHG Soap the night before surgery and the  morning of Surgery.  2.  If you choose to wash your hair, wash your hair first as usual with your  normal  shampoo.  3.  After you shampoo, rinse your hair and body thoroughly to remove the  shampoo.                           4.  Use CHG as you would any other liquid soap.  You can apply chg directly  to the skin and wash  Gently with a scrungie or clean washcloth.  5.  Apply the CHG Soap to your body ONLY FROM THE NECK DOWN.   Do not use on face/ open                           Wound or open sores. Avoid contact with eyes, ears mouth and genitals (private parts).                       Wash face,  Genitals (private parts) with your normal soap.             6.  Wash thoroughly, paying special attention to the area where your surgery  will be performed.  7.  Thoroughly rinse your body with warm water from the neck down.  8.  DO NOT shower/wash with your normal soap after using and rinsing off  the CHG Soap.                9.  Pat yourself dry with a clean towel.            10.  Wear clean pajamas.            11.  Place clean sheets on your bed the night of your first shower and do not  sleep with pets. Day of Surgery : Do not apply any lotions/deodorants the morning of surgery.  Please wear clean clothes to the hospital/surgery center.  FAILURE TO FOLLOW THESE INSTRUCTIONS MAY RESULT IN THE CANCELLATION OF YOUR SURGERY PATIENT SIGNATURE_________________________________  NURSE SIGNATURE__________________________________  ________________________________________________________________________  WHAT IS A BLOOD TRANSFUSION? Blood Transfusion Information  A transfusion is the replacement of blood or some of its parts. Blood is made up of multiple cells which  provide different functions.  Red blood cells carry oxygen and are used for blood loss replacement.  White blood cells fight against infection.  Platelets control bleeding.  Plasma helps clot blood.  Other blood products are available for specialized needs, such as hemophilia or other clotting disorders. BEFORE THE TRANSFUSION  Who gives blood for transfusions?   Healthy volunteers who are fully evaluated to make sure their blood is safe. This is blood bank blood. Transfusion therapy is the safest it has ever been in the practice of medicine. Before blood is taken from a donor, a complete history is taken to make sure that person has no history of diseases nor engages in risky social behavior (examples are intravenous drug use or sexual activity with multiple partners). The donor's travel history is screened to minimize risk of transmitting infections, such as malaria. The donated blood is tested for signs of infectious diseases, such as HIV and hepatitis. The blood is then tested to be sure it is compatible with you in order to minimize the chance of a transfusion reaction. If you or a relative donates blood, this is often done in anticipation of surgery and is not appropriate for emergency situations. It takes many days to process the donated blood. RISKS AND COMPLICATIONS Although transfusion therapy is very safe and saves many lives, the main dangers of transfusion include:  1. Getting an infectious disease. 2. Developing a transfusion reaction. This is an allergic reaction to something in the blood you were given. Every precaution is taken to prevent this. The decision to have a blood transfusion has been considered carefully by your caregiver before blood is given. Blood is not given unless the benefits outweigh  the risks. AFTER THE TRANSFUSION  Right after receiving a blood transfusion, you will usually feel much better and more energetic. This is especially true if your red blood cells  have gotten low (anemic). The transfusion raises the level of the red blood cells which carry oxygen, and this usually causes an energy increase.  The nurse administering the transfusion will monitor you carefully for complications. HOME CARE INSTRUCTIONS  No special instructions are needed after a transfusion. You may find your energy is better. Speak with your caregiver about any limitations on activity for underlying diseases you may have. SEEK MEDICAL CARE IF:   Your condition is not improving after your transfusion.  You develop redness or irritation at the intravenous (IV) site. SEEK IMMEDIATE MEDICAL CARE IF:  Any of the following symptoms occur over the next 12 hours:  Shaking chills.  You have a temperature by mouth above 102 F (38.9 C), not controlled by medicine.  Chest, back, or muscle pain.  People around you feel you are not acting correctly or are confused.  Shortness of breath or difficulty breathing.  Dizziness and fainting.  You get a rash or develop hives.  You have a decrease in urine output.  Your urine turns a dark color or changes to pink, red, or brown. Any of the following symptoms occur over the next 10 days:  You have a temperature by mouth above 102 F (38.9 C), not controlled by medicine.  Shortness of breath.  Weakness after normal activity.  The white part of the eye turns yellow (jaundice).  You have a decrease in the amount of urine or are urinating less often.  Your urine turns a dark color or changes to pink, red, or brown. Document Released: 03/07/2000 Document Revised: 06/02/2011 Document Reviewed: 10/25/2007 ExitCare Patient Information 2014 [place].  _______________________________________________________________________  Incentive Spirometer  An incentive spirometer is a tool that can help keep your lungs clear and active. This tool measures how well you are filling your lungs with each breath. Taking long deep  breaths may help reverse or decrease the chance of developing breathing (pulmonary) problems (especially infection) following:  A long period of time when you are unable to move or be active. BEFORE THE PROCEDURE   If the spirometer includes an indicator to show your best effort, your nurse or respiratory therapist will set it to a desired goal.  If possible, sit up straight or lean slightly forward. Try not to slouch.  Hold the incentive spirometer in an upright position. INSTRUCTIONS FOR USE  3. Sit on the edge of your bed if possible, or sit up as far as you can in bed or on a chair. 4. Hold the incentive spirometer in an upright position. 5. Breathe out normally. 6. Place the mouthpiece in your mouth and seal your lips tightly around it. 7. Breathe in slowly and as deeply as possible, raising the piston or the ball toward the top of the column. 8. Hold your breath for 3-5 seconds or for as long as possible. Allow the piston or ball to fall to the bottom of the column. 9. Remove the mouthpiece from your mouth and breathe out normally. 10. Rest for a few seconds and repeat Steps 1 through 7 at least 10 times every 1-2 hours when you are awake. Take your time and take a few normal breaths between deep breaths. 11. The spirometer may include an indicator to show your best effort. Use the indicator as a goal to work  toward during each repetition. 12. After each set of 10 deep breaths, practice coughing to be sure your lungs are clear. If you have an incision (the cut made at the time of surgery), support your incision when coughing by placing a pillow or rolled up towels firmly against it. Once you are able to get out of bed, walk around indoors and cough well. You may stop using the incentive spirometer when instructed by your caregiver.  RISKS AND COMPLICATIONS  Take your time so you do not get dizzy or light-headed.  If you are in pain, you may need to take or ask for pain medication  before doing incentive spirometry. It is harder to take a deep breath if you are having pain. AFTER USE  Rest and breathe slowly and easily.  It can be helpful to keep track of a log of your progress. Your caregiver can provide you with a simple table to help with this. If you are using the spirometer at home, follow these instructions: [place] IF:   You are having difficultly using the spirometer.  You have trouble using the spirometer as often as instructed.  Your pain medication is not giving enough relief while using the spirometer.  You develop fever of 100.5 F (38.1 C) or higher. SEEK IMMEDIATE MEDICAL CARE IF:   You cough up bloody sputum that had not been present before.  You develop fever of 102 F (38.9 C) or greater.  You develop worsening pain at or near the incision site. MAKE SURE YOU:   Understand these instructions.  Will watch your condition.  Will get help right away if you are not doing well or get worse. Document Released: 07/21/2006 Document Revised: 06/02/2011 Document Reviewed: 09/21/2006 [facility] Patient Information 2014 [place], [place].   ________________________________________________________________________

## 2015-04-03 ENCOUNTER — Encounter (HOSPITAL_COMMUNITY)
Admission: RE | Admit: 2015-04-03 | Discharge: 2015-04-03 | Disposition: A | Discharge status: Home / Self Care | Payer: Medicare Other | Source: Ambulatory Visit | Attending: Orthopedic Surgery | Admitting: Orthopedic Surgery

## 2015-04-03 ENCOUNTER — Encounter (HOSPITAL_COMMUNITY): Payer: Self-pay

## 2015-04-03 DIAGNOSIS — Z01812 Encounter for preprocedural laboratory examination: Secondary | ICD-10-CM | POA: Insufficient documentation

## 2015-04-03 DIAGNOSIS — M1711 Unilateral primary osteoarthritis, right knee: Secondary | ICD-10-CM | POA: Diagnosis not present

## 2015-04-03 DIAGNOSIS — Z0183 Encounter for blood typing: Secondary | ICD-10-CM | POA: Insufficient documentation

## 2015-04-03 HISTORY — DX: Headache, unspecified: R51.9

## 2015-04-03 HISTORY — DX: Headache: R51

## 2015-04-03 LAB — BASIC METABOLIC PANEL
Anion gap: 10 (ref 5–15)
BUN: 16 mg/dL (ref 6–20)
CO2: 31 mmol/L (ref 22–32)
Calcium: 9.7 mg/dL (ref 8.9–10.3)
Chloride: 100 mmol/L — ABNORMAL LOW (ref 101–111)
Creatinine, Ser: 0.85 mg/dL (ref 0.44–1.00)
GFR calc Af Amer: >60 mL/min (ref >60)
GFR calc non Af Amer: >60 mL/min (ref >60)
Glucose, Bld: 84 mg/dL (ref 65–99)
Potassium: 3.8 mmol/L (ref 3.5–5.1)
Sodium: 141 mmol/L (ref 135–145)

## 2015-04-03 LAB — URINE MICROSCOPIC-ADD ON

## 2015-04-03 LAB — SURGICAL PCR SCREEN
MRSA, PCR: NEGATIVE
Staphylococcus aureus: NEGATIVE

## 2015-04-03 LAB — CBC
HCT: 40.6 % (ref 36.0–46.0)
Hemoglobin: 13.4 g/dL (ref 12.0–15.0)
MCH: 31.8 pg (ref 26.0–34.0)
MCHC: 33 g/dL (ref 30.0–36.0)
MCV: 96.4 fL (ref 78.0–100.0)
Platelets: 302 10*3/uL (ref 150–400)
RBC: 4.21 MIL/uL (ref 3.87–5.11)
RDW: 13.7 % (ref 11.5–15.5)
WBC: 6 10*3/uL (ref 4.0–10.5)

## 2015-04-03 LAB — URINALYSIS, ROUTINE W REFLEX MICROSCOPIC
Bilirubin Urine: NEGATIVE
Glucose, UA: NEGATIVE mg/dL
Hgb urine dipstick: NEGATIVE
Ketones, ur: NEGATIVE mg/dL
Nitrite: NEGATIVE
Protein, ur: NEGATIVE mg/dL
Specific Gravity, Urine: 1.007 (ref 1.005–1.030)
pH: 6 (ref 5.0–8.0)

## 2015-04-03 LAB — TYPE AND SCREEN
ABO/RH(D): A POS
Antibody Screen: NEGATIVE

## 2015-04-03 LAB — PROTIME-INR
INR: 1 (ref 0.00–1.49)
Prothrombin Time: 13.4 seconds (ref 11.6–15.2)

## 2015-04-03 LAB — ABO/RH: ABO/RH(D): A POS

## 2015-04-03 LAB — APTT: aPTT: 36 seconds (ref 24–37)

## 2015-04-03 NOTE — Progress Notes (Signed)
Clearance- Dr Truman Hayward 03/15/2015 on chart  LOV- DR Truman Hayward- 03/13/2015 on chart  EKG- 03/13/2015 on chart

## 2015-04-03 NOTE — Progress Notes (Signed)
U/A and micro results done 04/03/15 faxed via EPIC to Dr Alvan Dame.

## 2015-04-06 DIAGNOSIS — R609 Edema, unspecified: Secondary | ICD-10-CM | POA: Diagnosis not present

## 2015-04-06 DIAGNOSIS — Z6822 Body mass index (BMI) 22.0-22.9, adult: Secondary | ICD-10-CM | POA: Diagnosis not present

## 2015-04-06 DIAGNOSIS — F329 Major depressive disorder, single episode, unspecified: Secondary | ICD-10-CM | POA: Diagnosis not present

## 2015-04-06 DIAGNOSIS — F411 Generalized anxiety disorder: Secondary | ICD-10-CM | POA: Diagnosis not present

## 2015-04-06 DIAGNOSIS — G609 Hereditary and idiopathic neuropathy, unspecified: Secondary | ICD-10-CM | POA: Diagnosis not present

## 2015-04-06 DIAGNOSIS — K219 Gastro-esophageal reflux disease without esophagitis: Secondary | ICD-10-CM | POA: Diagnosis not present

## 2015-04-06 DIAGNOSIS — M810 Age-related osteoporosis without current pathological fracture: Secondary | ICD-10-CM | POA: Diagnosis not present

## 2015-04-06 DIAGNOSIS — M159 Polyosteoarthritis, unspecified: Secondary | ICD-10-CM | POA: Diagnosis not present

## 2015-04-06 DIAGNOSIS — G4700 Insomnia, unspecified: Secondary | ICD-10-CM | POA: Diagnosis not present

## 2015-04-09 ENCOUNTER — Encounter (HOSPITAL_COMMUNITY)
Admission: RE | Disposition: A | Discharge status: Home Health | Payer: Self-pay | Source: Ambulatory Visit | Attending: Orthopedic Surgery

## 2015-04-09 ENCOUNTER — Inpatient Hospital Stay (HOSPITAL_COMMUNITY): Payer: Medicare Other | Admitting: Anesthesiology

## 2015-04-09 ENCOUNTER — Inpatient Hospital Stay (HOSPITAL_COMMUNITY)
Admission: RE | Admit: 2015-04-09 | Discharge: 2015-04-10 | DRG: 470 | Disposition: A | Discharge status: Home Health | Payer: Medicare Other | Source: Ambulatory Visit | Attending: Orthopedic Surgery | Admitting: Orthopedic Surgery

## 2015-04-09 ENCOUNTER — Encounter (HOSPITAL_COMMUNITY): Payer: Self-pay | Admitting: *Deleted

## 2015-04-09 DIAGNOSIS — Z79899 Other long term (current) drug therapy: Secondary | ICD-10-CM | POA: Diagnosis not present

## 2015-04-09 DIAGNOSIS — Z01812 Encounter for preprocedural laboratory examination: Secondary | ICD-10-CM

## 2015-04-09 DIAGNOSIS — K219 Gastro-esophageal reflux disease without esophagitis: Secondary | ICD-10-CM | POA: Diagnosis present

## 2015-04-09 DIAGNOSIS — F419 Anxiety disorder, unspecified: Secondary | ICD-10-CM | POA: Diagnosis present

## 2015-04-09 DIAGNOSIS — Z96641 Presence of right artificial hip joint: Secondary | ICD-10-CM | POA: Diagnosis present

## 2015-04-09 DIAGNOSIS — G4700 Insomnia, unspecified: Secondary | ICD-10-CM | POA: Diagnosis present

## 2015-04-09 DIAGNOSIS — G8918 Other acute postprocedural pain: Secondary | ICD-10-CM | POA: Diagnosis not present

## 2015-04-09 DIAGNOSIS — Z96651 Presence of right artificial knee joint: Secondary | ICD-10-CM

## 2015-04-09 DIAGNOSIS — M179 Osteoarthritis of knee, unspecified: Secondary | ICD-10-CM | POA: Diagnosis not present

## 2015-04-09 DIAGNOSIS — M4326 Fusion of spine, lumbar region: Secondary | ICD-10-CM | POA: Diagnosis present

## 2015-04-09 DIAGNOSIS — G629 Polyneuropathy, unspecified: Secondary | ICD-10-CM | POA: Diagnosis present

## 2015-04-09 DIAGNOSIS — Z87891 Personal history of nicotine dependence: Secondary | ICD-10-CM

## 2015-04-09 DIAGNOSIS — M65861 Other synovitis and tenosynovitis, right lower leg: Secondary | ICD-10-CM | POA: Diagnosis present

## 2015-04-09 DIAGNOSIS — M1711 Unilateral primary osteoarthritis, right knee: Secondary | ICD-10-CM | POA: Diagnosis present

## 2015-04-09 DIAGNOSIS — Z8614 Personal history of Methicillin resistant Staphylococcus aureus infection: Secondary | ICD-10-CM | POA: Diagnosis not present

## 2015-04-09 DIAGNOSIS — M25561 Pain in right knee: Secondary | ICD-10-CM | POA: Diagnosis not present

## 2015-04-09 DIAGNOSIS — Z96659 Presence of unspecified artificial knee joint: Secondary | ICD-10-CM

## 2015-04-09 HISTORY — PX: TOTAL KNEE ARTHROPLASTY: SHX125

## 2015-04-09 SURGERY — ARTHROPLASTY, KNEE, TOTAL
Anesthesia: Regional | Site: Knee | Laterality: Right

## 2015-04-09 MED ORDER — LACTATED RINGERS IV SOLN
INTRAVENOUS | Status: DC | PRN
  Administered 2015-04-09 (×2): via INTRAVENOUS

## 2015-04-09 MED ORDER — FENTANYL CITRATE (PF) 100 MCG/2ML IJ SOLN
INTRAMUSCULAR | Status: AC
  Filled 2015-04-09: qty 2

## 2015-04-09 MED ORDER — BISACODYL 10 MG RE SUPP: 10.0000 mg | Freq: Every day | RECTAL | Status: DC | PRN

## 2015-04-09 MED ORDER — LIDOCAINE HCL (CARDIAC) 20 MG/ML IV SOLN
INTRAVENOUS | Status: AC
  Filled 2015-04-09: qty 5

## 2015-04-09 MED ORDER — SODIUM CHLORIDE 0.9 % IV SOLN
INTRAVENOUS | Status: DC
  Administered 2015-04-09: 16:00:00 via INTRAVENOUS
  Filled 2015-04-09 (×5): qty 1000

## 2015-04-09 MED ORDER — EPHEDRINE SULFATE 50 MG/ML IJ SOLN
INTRAMUSCULAR | Status: DC | PRN
  Administered 2015-04-09 (×3): 10 mg via INTRAVENOUS

## 2015-04-09 MED ORDER — KETOROLAC TROMETHAMINE 30 MG/ML IJ SOLN
INTRAMUSCULAR | Status: AC
  Filled 2015-04-09: qty 1

## 2015-04-09 MED ORDER — MIDAZOLAM HCL 5 MG/5ML IJ SOLN
INTRAMUSCULAR | Status: DC | PRN
  Administered 2015-04-09: 2 mg via INTRAVENOUS

## 2015-04-09 MED ORDER — KETOROLAC TROMETHAMINE 30 MG/ML IJ SOLN
INTRAMUSCULAR | Status: DC | PRN
  Administered 2015-04-09: 30 mg via INTRAVENOUS

## 2015-04-09 MED ORDER — METHOCARBAMOL 500 MG PO TABS: 500.0000 mg | ORAL_TABLET | Freq: Four times a day (QID) | ORAL | Status: DC | PRN

## 2015-04-09 MED ORDER — SODIUM CHLORIDE 0.9 % IJ SOLN
INTRAMUSCULAR | Status: AC
Start: 2015-04-09 — End: 2015-04-09
  Filled 2015-04-09: qty 50

## 2015-04-09 MED ORDER — FENTANYL CITRATE (PF) 100 MCG/2ML IJ SOLN
50.0000 ug | INTRAMUSCULAR | Status: AC | PRN
  Administered 2015-04-09: 25 ug via INTRAVENOUS
  Administered 2015-04-09: 100 ug via INTRAVENOUS
  Administered 2015-04-09: 25 ug via INTRAVENOUS

## 2015-04-09 MED ORDER — PROPOFOL 10 MG/ML IV BOLUS
INTRAVENOUS | Status: DC | PRN
Start: 2015-04-09 — End: 2015-04-09
  Administered 2015-04-09: 100 mg via INTRAVENOUS

## 2015-04-09 MED ORDER — HYDROMORPHONE HCL 1 MG/ML IJ SOLN: 0.5000 mg | INTRAMUSCULAR | Status: DC | PRN

## 2015-04-09 MED ORDER — CHLORHEXIDINE GLUCONATE 4 % EX LIQD: 60.0000 mL | Freq: Once | CUTANEOUS | Status: DC

## 2015-04-09 MED ORDER — MORPHINE SULFATE ER 30 MG PO TBCR: 30.0000 mg | EXTENDED_RELEASE_TABLET | Freq: Two times a day (BID) | ORAL | Status: DC

## 2015-04-09 MED ORDER — DIPHENHYDRAMINE HCL 25 MG PO CAPS: 25.0000 mg | ORAL_CAPSULE | Freq: Four times a day (QID) | ORAL | Status: DC | PRN

## 2015-04-09 MED ORDER — SODIUM CHLORIDE 0.9 % IV SOLN
1000.0000 mg | Freq: Once | INTRAVENOUS | Status: AC
  Administered 2015-04-09: 1000 mg via INTRAVENOUS
  Filled 2015-04-09: qty 10

## 2015-04-09 MED ORDER — BUPIVACAINE-EPINEPHRINE (PF) 0.25% -1:200000 IJ SOLN
INTRAMUSCULAR | Status: DC | PRN
  Administered 2015-04-09: 30 mL

## 2015-04-09 MED ORDER — LIDOCAINE HCL (CARDIAC) 20 MG/ML IV SOLN
INTRAVENOUS | Status: DC | PRN
  Administered 2015-04-09: 50 mg via INTRAVENOUS

## 2015-04-09 MED ORDER — DEXAMETHASONE SODIUM PHOSPHATE 10 MG/ML IJ SOLN
10.0000 mg | Freq: Once | INTRAMUSCULAR | Status: AC
  Administered 2015-04-09: 10 mg via INTRAVENOUS

## 2015-04-09 MED ORDER — METOCLOPRAMIDE HCL 5 MG/ML IJ SOLN: 5.0000 mg | Freq: Three times a day (TID) | INTRAMUSCULAR | Status: DC | PRN

## 2015-04-09 MED ORDER — TRAZODONE HCL 100 MG PO TABS
100.0000 mg | ORAL_TABLET | Freq: Every day | ORAL | Status: DC
  Filled 2015-04-09 (×2): qty 1

## 2015-04-09 MED ORDER — ROPIVACAINE HCL 5 MG/ML IJ SOLN
INTRAMUSCULAR | Status: AC
Start: 2015-04-09 — End: 2015-04-09
  Filled 2015-04-09: qty 30

## 2015-04-09 MED ORDER — SODIUM CHLORIDE 0.9 % IJ SOLN
INTRAMUSCULAR | Status: DC | PRN
  Administered 2015-04-09: 30 mL

## 2015-04-09 MED ORDER — OXYCODONE HCL 5 MG PO TABS
5.0000 mg | ORAL_TABLET | ORAL | Status: DC
  Administered 2015-04-09: 5 mg via ORAL
  Administered 2015-04-10 (×3): 10 mg via ORAL
  Filled 2015-04-09 (×2): qty 2
  Filled 2015-04-09: qty 1
  Filled 2015-04-09: qty 2

## 2015-04-09 MED ORDER — CLONAZEPAM 0.5 MG PO TABS
0.5000 mg | ORAL_TABLET | Freq: Three times a day (TID) | ORAL | Status: DC
  Administered 2015-04-10: 0.5 mg via ORAL
  Filled 2015-04-09 (×2): qty 1

## 2015-04-09 MED ORDER — BUPIVACAINE-EPINEPHRINE (PF) 0.25% -1:200000 IJ SOLN
INTRAMUSCULAR | Status: AC
  Filled 2015-04-09: qty 30

## 2015-04-09 MED ORDER — MIDAZOLAM HCL 2 MG/2ML IJ SOLN
INTRAMUSCULAR | Status: AC
  Filled 2015-04-09: qty 2

## 2015-04-09 MED ORDER — SODIUM CHLORIDE 0.9 % IJ SOLN
INTRAMUSCULAR | Status: AC
  Filled 2015-04-09: qty 10

## 2015-04-09 MED ORDER — ONDANSETRON HCL 4 MG/2ML IJ SOLN
INTRAMUSCULAR | Status: AC
  Filled 2015-04-09: qty 2

## 2015-04-09 MED ORDER — CELECOXIB 200 MG PO CAPS
200.0000 mg | ORAL_CAPSULE | Freq: Two times a day (BID) | ORAL | Status: DC
  Administered 2015-04-09 – 2015-04-10 (×2): 200 mg via ORAL
  Filled 2015-04-09 (×3): qty 1

## 2015-04-09 MED ORDER — DEXAMETHASONE SODIUM PHOSPHATE 10 MG/ML IJ SOLN
10.0000 mg | Freq: Once | INTRAMUSCULAR | Status: AC
  Administered 2015-04-10: 10 mg via INTRAVENOUS

## 2015-04-09 MED ORDER — PHENOL 1.4 % MT LIQD
1.0000 | OROMUCOSAL | Status: DC | PRN
Start: 2015-04-09 — End: 2015-04-10

## 2015-04-09 MED ORDER — DOCUSATE SODIUM 100 MG PO CAPS
100.0000 mg | ORAL_CAPSULE | Freq: Two times a day (BID) | ORAL | Status: DC
  Administered 2015-04-09 – 2015-04-10 (×2): 100 mg via ORAL

## 2015-04-09 MED ORDER — ALUM & MAG HYDROXIDE-SIMETH 200-200-20 MG/5ML PO SUSP: 30.0000 mL | ORAL | Status: DC | PRN

## 2015-04-09 MED ORDER — SODIUM CHLORIDE 0.9 % IR SOLN
Status: DC | PRN
  Administered 2015-04-09: 1000 mL

## 2015-04-09 MED ORDER — ONDANSETRON HCL 4 MG/2ML IJ SOLN
INTRAMUSCULAR | Status: DC | PRN
  Administered 2015-04-09: 4 mg via INTRAVENOUS

## 2015-04-09 MED ORDER — CEFAZOLIN SODIUM-DEXTROSE 2-3 GM-% IV SOLR
INTRAVENOUS | Status: AC
Start: 2015-04-09 — End: 2015-04-09
  Filled 2015-04-09: qty 50

## 2015-04-09 MED ORDER — 0.9 % SODIUM CHLORIDE (POUR BTL) OPTIME
TOPICAL | Status: DC | PRN
  Administered 2015-04-09: 1000 mL

## 2015-04-09 MED ORDER — CITALOPRAM HYDROBROMIDE 40 MG PO TABS
40.0000 mg | ORAL_TABLET | Freq: Every day | ORAL | Status: DC
  Administered 2015-04-10: 40 mg via ORAL
  Filled 2015-04-09: qty 1

## 2015-04-09 MED ORDER — PHENYLEPHRINE HCL 10 MG/ML IJ SOLN
INTRAMUSCULAR | Status: AC
  Filled 2015-04-09: qty 1

## 2015-04-09 MED ORDER — HYDROMORPHONE HCL 2 MG/ML IJ SOLN
INTRAMUSCULAR | Status: AC
  Filled 2015-04-09: qty 1

## 2015-04-09 MED ORDER — FERROUS SULFATE 325 (65 FE) MG PO TABS
325.0000 mg | ORAL_TABLET | Freq: Three times a day (TID) | ORAL | Status: DC
  Administered 2015-04-10 (×2): 325 mg via ORAL
  Filled 2015-04-09 (×6): qty 1

## 2015-04-09 MED ORDER — MENTHOL 3 MG MT LOZG: 1.0000 | LOZENGE | OROMUCOSAL | Status: DC | PRN

## 2015-04-09 MED ORDER — PROPOFOL 10 MG/ML IV BOLUS
INTRAVENOUS | Status: AC
  Filled 2015-04-09: qty 40

## 2015-04-09 MED ORDER — PANTOPRAZOLE SODIUM 40 MG PO TBEC
40.0000 mg | DELAYED_RELEASE_TABLET | Freq: Two times a day (BID) | ORAL | Status: DC
  Administered 2015-04-09 – 2015-04-10 (×2): 40 mg via ORAL
  Filled 2015-04-09 (×3): qty 1

## 2015-04-09 MED ORDER — POLYETHYLENE GLYCOL 3350 17 G PO PACK
17.0000 g | PACK | Freq: Two times a day (BID) | ORAL | Status: DC
  Administered 2015-04-10: 17 g via ORAL

## 2015-04-09 MED ORDER — ONDANSETRON HCL 4 MG PO TABS: 4.0000 mg | ORAL_TABLET | Freq: Four times a day (QID) | ORAL | Status: DC | PRN

## 2015-04-09 MED ORDER — CEFAZOLIN SODIUM-DEXTROSE 2-3 GM-% IV SOLR
2.0000 g | Freq: Four times a day (QID) | INTRAVENOUS | Status: AC
  Administered 2015-04-09 (×2): 2 g via INTRAVENOUS
  Filled 2015-04-09 (×2): qty 50

## 2015-04-09 MED ORDER — HYDROMORPHONE HCL 1 MG/ML IJ SOLN: 0.2500 mg | INTRAMUSCULAR | Status: DC | PRN

## 2015-04-09 MED ORDER — HYDROMORPHONE HCL 1 MG/ML IJ SOLN
INTRAMUSCULAR | Status: DC | PRN
  Administered 2015-04-09 (×2): .2 mg via INTRAVENOUS

## 2015-04-09 MED ORDER — DEXTROSE 5 % IV SOLN
500.0000 mg | Freq: Four times a day (QID) | INTRAVENOUS | Status: DC | PRN
  Administered 2015-04-09: 500 mg via INTRAVENOUS
  Filled 2015-04-09 (×2): qty 5

## 2015-04-09 MED ORDER — FUROSEMIDE 40 MG PO TABS
60.0000 mg | ORAL_TABLET | Freq: Every day | ORAL | Status: DC
  Administered 2015-04-09 – 2015-04-10 (×2): 60 mg via ORAL
  Filled 2015-04-09 (×2): qty 1

## 2015-04-09 MED ORDER — METOCLOPRAMIDE HCL 10 MG PO TABS: 5.0000 mg | ORAL_TABLET | Freq: Three times a day (TID) | ORAL | Status: DC | PRN

## 2015-04-09 MED ORDER — ASPIRIN EC 325 MG PO TBEC
325.0000 mg | DELAYED_RELEASE_TABLET | Freq: Two times a day (BID) | ORAL | Status: DC
Start: 2015-04-10 — End: 2015-04-10
  Administered 2015-04-10: 325 mg via ORAL
  Filled 2015-04-09 (×3): qty 1

## 2015-04-09 MED ORDER — PREGABALIN 75 MG PO CAPS
75.0000 mg | ORAL_CAPSULE | Freq: Three times a day (TID) | ORAL | Status: DC
  Administered 2015-04-09 – 2015-04-10 (×3): 75 mg via ORAL
  Filled 2015-04-09 (×3): qty 1

## 2015-04-09 MED ORDER — STERILE WATER FOR IRRIGATION IR SOLN
Status: DC | PRN
  Administered 2015-04-09: 2000 mL

## 2015-04-09 MED ORDER — DEXAMETHASONE SODIUM PHOSPHATE 10 MG/ML IJ SOLN
INTRAMUSCULAR | Status: AC
  Filled 2015-04-09: qty 1

## 2015-04-09 MED ORDER — ACETAMINOPHEN 500 MG PO TABS
1000.0000 mg | ORAL_TABLET | Freq: Three times a day (TID) | ORAL | Status: DC
  Administered 2015-04-09 – 2015-04-10 (×3): 1000 mg via ORAL
  Filled 2015-04-09 (×5): qty 2

## 2015-04-09 MED ORDER — ROPIVACAINE HCL 5 MG/ML IJ SOLN
INTRAMUSCULAR | Status: DC | PRN
  Administered 2015-04-09: 30 mL via PERINEURAL

## 2015-04-09 MED ORDER — MAGNESIUM CITRATE PO SOLN: 1.0000 | Freq: Once | ORAL | Status: DC | PRN

## 2015-04-09 MED ORDER — CEFAZOLIN SODIUM-DEXTROSE 2-3 GM-% IV SOLR
2.0000 g | INTRAVENOUS | Status: AC
  Administered 2015-04-09: 2 g via INTRAVENOUS

## 2015-04-09 MED ORDER — ONDANSETRON HCL 4 MG/2ML IJ SOLN: 4.0000 mg | Freq: Four times a day (QID) | INTRAMUSCULAR | Status: DC | PRN

## 2015-04-09 SURGICAL SUPPLY — 45 items
BAG DECANTER FOR FLEXI CONT (MISCELLANEOUS) IMPLANT
BAG SPEC THK2 15X12 ZIP CLS (MISCELLANEOUS) ×1
BAG ZIPLOCK 12X15 (MISCELLANEOUS) ×2 IMPLANT
BANDAGE ACE 6X5 VEL STRL LF (GAUZE/BANDAGES/DRESSINGS) ×2 IMPLANT
BLADE SAW SGTL 13.0X1.19X90.0M (BLADE) ×2 IMPLANT
BOWL SMART MIX CTS (DISPOSABLE) ×2 IMPLANT
CAPT KNEE TOTAL 3 ATTUNE ×2 IMPLANT
CEMENT HV SMART SET (Cement) ×4 IMPLANT
CLOTH BEACON ORANGE TIMEOUT ST (SAFETY) ×2 IMPLANT
CUFF TOURN SGL QUICK 34 (TOURNIQUET CUFF) ×2
CUFF TRNQT CYL 34X4X40X1 (TOURNIQUET CUFF) ×1 IMPLANT
DECANTER SPIKE VIAL GLASS SM (MISCELLANEOUS) ×2 IMPLANT
DRAPE U-SHAPE 47X51 STRL (DRAPES) ×2 IMPLANT
DRSG AQUACEL AG ADV 3.5X10 (GAUZE/BANDAGES/DRESSINGS) ×2 IMPLANT
DURAPREP 26ML APPLICATOR (WOUND CARE) ×4 IMPLANT
ELECT REM PT RETURN 9FT ADLT (ELECTROSURGICAL) ×2
ELECTRODE REM PT RTRN 9FT ADLT (ELECTROSURGICAL) ×1 IMPLANT
GLOVE BIOGEL M 7.0 STRL (GLOVE) IMPLANT
GLOVE BIOGEL PI IND STRL 7.5 (GLOVE) ×1 IMPLANT
GLOVE BIOGEL PI IND STRL 8.5 (GLOVE) ×1 IMPLANT
GLOVE BIOGEL PI INDICATOR 7.5 (GLOVE) ×1
GLOVE BIOGEL PI INDICATOR 8.5 (GLOVE) ×1
GLOVE ECLIPSE 8.0 STRL XLNG CF (GLOVE) ×4 IMPLANT
GLOVE ORTHO TXT STRL SZ7.5 (GLOVE) ×4 IMPLANT
GOWN STRL REUS W/TWL LRG LVL3 (GOWN DISPOSABLE) ×2 IMPLANT
GOWN STRL REUS W/TWL XL LVL3 (GOWN DISPOSABLE) ×2 IMPLANT
HANDPIECE INTERPULSE COAX TIP (DISPOSABLE) ×2
LIQUID BAND (GAUZE/BANDAGES/DRESSINGS) ×2 IMPLANT
MANIFOLD NEPTUNE II (INSTRUMENTS) ×2 IMPLANT
PACK TOTAL KNEE CUSTOM (KITS) ×2 IMPLANT
POSITIONER SURGICAL ARM (MISCELLANEOUS) ×2 IMPLANT
SET HNDPC FAN SPRY TIP SCT (DISPOSABLE) ×1 IMPLANT
SET PAD KNEE POSITIONER (MISCELLANEOUS) ×2 IMPLANT
SUCTION FRAZIER 12FR DISP (SUCTIONS) IMPLANT
SUT MNCRL AB 4-0 PS2 18 (SUTURE) ×2 IMPLANT
SUT VIC AB 1 CT1 36 (SUTURE) ×2 IMPLANT
SUT VIC AB 2-0 CT1 27 (SUTURE) ×6
SUT VIC AB 2-0 CT1 TAPERPNT 27 (SUTURE) ×3 IMPLANT
SUT VLOC 180 0 24IN GS25 (SUTURE) ×2 IMPLANT
SYR 50ML LL SCALE MARK (SYRINGE) ×2 IMPLANT
TRAY FOLEY W/METER SILVER 14FR (SET/KITS/TRAYS/PACK) ×2 IMPLANT
TRAY FOLEY W/METER SILVER 16FR (SET/KITS/TRAYS/PACK) IMPLANT
WATER STERILE IRR 1500ML POUR (IV SOLUTION) ×2 IMPLANT
WRAP KNEE MAXI GEL POST OP (GAUZE/BANDAGES/DRESSINGS) ×2 IMPLANT
YANKAUER SUCT BULB TIP 10FT TU (MISCELLANEOUS) IMPLANT

## 2015-04-09 NOTE — Anesthesia Procedure Notes (Addendum)
Anesthesia Regional Block:  Adductor canal block  Pre-Anesthetic Checklist: ,, timeout performed, Correct Patient, Correct Site, Correct Laterality, Correct Procedure, Correct Position, site marked, Risks and benefits discussed, pre-op evaluation,  At surgeon's request and post-op pain management  Laterality: Right  Prep: Maximum Sterile Barrier Precautions used and chloraprep       Needles:  Injection technique: Single-shot  Needle Type: Echogenic Stimulator Needle     Needle Length: 9cm 9 cm Needle Gauge: 21 and 21 G    Additional Needles:  Procedures: ultrasound guided (picture in chart) Adductor canal block Narrative:  Start time: 04/09/2015 8:51 AM End time: 04/09/2015 9:01 AM Injection made incrementally with aspirations every 5 mL. Anesthesiologist: Roderic Palau  Additional Notes: 2% Lidocaine skin wheel.

## 2015-04-09 NOTE — Interval H&P Note (Signed)
History and Physical Interval Note:  04/09/2015 9:08 AM  Amanda Guerrero  has presented today for surgery, with the diagnosis of RIGHT KNEE OA  The various methods of treatment have been discussed with the patient and family. After consideration of risks, benefits and other options for treatment, the patient has consented to  Procedure(s): TOTAL KNEE ARTHROPLASTY (Right) as a surgical intervention .  The patient's history has been reviewed, patient examined, no change in status, stable for surgery.  I have reviewed the patient's chart and labs.  Questions were answered to the patient's satisfaction.     Mauri Pole

## 2015-04-09 NOTE — Transfer of Care (Signed)
Immediate Anesthesia Transfer of Care Note  Patient: Amanda Guerrero  Procedure(s) Performed: Procedure(s) with comments: TOTAL KNEE ARTHROPLASTY (Right) - received block as well prior to patient entrance.  Patient Location: PACU  Anesthesia Type:GA combined with regional for post-op pain  Level of Consciousness:  sedated, patient cooperative and responds to stimulation  Airway & Oxygen Therapy:Patient Spontanous Breathing and Patient connected to face mask oxgen  Post-op Assessment:  Report given to PACU RN and Post -op Vital signs reviewed and stable  Post vital signs:  Reviewed and stable  Last Vitals:  Filed Vitals:   04/09/15 0918 04/09/15 0919  BP:    Pulse: 66 67  Temp:    Resp: 21 18    Complications: No apparent anesthesia complications

## 2015-04-09 NOTE — Discharge Instructions (Signed)

## 2015-04-09 NOTE — Progress Notes (Signed)
Utilization review completed.  

## 2015-04-09 NOTE — Anesthesia Postprocedure Evaluation (Signed)
Anesthesia Post Note  Patient: Amanda Guerrero  Procedure(s) Performed: Procedure(s) (LRB): TOTAL KNEE ARTHROPLASTY (Right)  Patient location during evaluation: PACU Anesthesia Type: General and Regional Level of consciousness: awake and alert Pain management: pain level controlled Vital Signs Assessment: post-procedure vital signs reviewed and stable Respiratory status: spontaneous breathing, nonlabored ventilation, respiratory function stable and patient connected to nasal cannula oxygen Cardiovascular status: blood pressure returned to baseline and stable Postop Assessment: no signs of nausea or vomiting Anesthetic complications: no    Last Vitals:  Filed Vitals:   04/09/15 1145 04/09/15 1200  BP: 97/65 93/60  Pulse: 89 85  Temp:    Resp: 12 13    Last Pain:  Filed Vitals:   04/09/15 1203  PainSc: 0-No pain        RLE Motor Response: Purposeful movement (04/09/15 1200) RLE Sensation: Full sensation (04/09/15 1200)      Amanda Guerrero,Amanda Guerrero

## 2015-04-09 NOTE — Progress Notes (Signed)
AssistedDr. Oren Bracket with righ adductor canal block. Side rails up, monitors on throughout procedure. See vital signs in flow sheet. Tolerated Procedure well.

## 2015-04-09 NOTE — Anesthesia Preprocedure Evaluation (Addendum)
Anesthesia Evaluation  Patient identified by MRN, date of birth, ID band Patient awake    Reviewed: Allergy & Precautions, H&P , NPO status , Patient's Chart, lab work & pertinent test results  Airway Mallampati: III  TM Distance: >3 FB     Dental no notable dental hx. (+) Edentulous Upper, Dental Advisory Given   Pulmonary neg pulmonary ROS, former smoker,    Pulmonary exam normal breath sounds clear to auscultation       Cardiovascular negative cardio ROS   Rhythm:Regular Rate:Normal     Neuro/Psych  Headaches, Anxiety Depression    GI/Hepatic Neg liver ROS, GERD  Medicated and Controlled,  Endo/Other  negative endocrine ROS  Renal/GU negative Renal ROS  negative genitourinary   Musculoskeletal  (+) Arthritis , Osteoarthritis,    Abdominal   Peds  Hematology negative hematology ROS (+)   Anesthesia Other Findings   Reproductive/Obstetrics negative OB ROS                            Anesthesia Physical Anesthesia Plan  ASA: III  Anesthesia Plan: Regional and General   Post-op Pain Management: GA combined w/ Regional for post-op pain   Induction: Intravenous  Airway Management Planned: LMA  Additional Equipment:   Intra-op Plan:   Post-operative Plan: Extubation in OR  Informed Consent: I have reviewed the patients History and Physical, chart, labs and discussed the procedure including the risks, benefits and alternatives for the proposed anesthesia with the patient or authorized representative who has indicated his/her understanding and acceptance.   Dental advisory given  Plan Discussed with: CRNA  Anesthesia Plan Comments:        Anesthesia Quick Evaluation

## 2015-04-09 NOTE — Op Note (Signed)
NAME:  Amanda Guerrero                      MEDICAL RECORD NO.:  [id number]                             FACILITY:  [facility]      PHYSICIAN:  Pietro Cassis. Alvan Dame, M.D.  DATE OF BIRTH:  03/19/1951      DATE OF PROCEDURE:  04/09/2015                                     OPERATIVE REPORT         PREOPERATIVE DIAGNOSIS:  Right knee osteoarthritis.      POSTOPERATIVE DIAGNOSIS:  Right knee osteoarthritis.      FINDINGS:  The patient was noted to have complete loss of cartilage and   bone-on-bone arthritis with associated osteophytes in the lateral  and patellofemoral compartments of   the knee with a significant synovitis and associated effusion.      PROCEDURE:  Right total knee replacement.      COMPONENTS USED:  DePuy Attune rotating platform posterior stabilized knee   system, a size 3 femur, 4 tibia, size 8 mm PS AOX insert, and 38 anatomic patellar   button.      SURGEON:  Pietro Cassis. Alvan Dame, M.D.      ASSISTANT:  Danae Orleans, PA-C.      ANESTHESIA:  General and Regional.      SPECIMENS:  None.      COMPLICATION:  None.      DRAINS:  None  EBL: <150cc      TOURNIQUET TIME:   Total Tourniquet Time Documented: Thigh (Right) - 28 minutes Total: Thigh (Right) - 28 minutes  .      The patient was stable to the recovery room.      INDICATION FOR PROCEDURE:  Amanda Guerrero is a 65 y.o. female patient of   mine.  The patient had been seen, evaluated, and treated conservatively in the   office with medication, activity modification, and injections.  The patient had   radiographic changes of bone-on-bone arthritis with endplate sclerosis and osteophytes noted.      The patient failed conservative measures including medication, injections, and activity modification, and at this point was ready for more definitive measures.   Based on the radiographic changes and failed conservative measures, the patient   decided to proceed with total knee replacement.  Risks of infection,   DVT,  component failure, need for revision surgery, postop course, and   expectations were all   discussed and reviewed.  Consent was obtained for benefit of pain   relief.      PROCEDURE IN DETAIL:  The patient was brought to the operative theater.   Once adequate anesthesia, preoperative antibiotics, 2 gm of Ancef, 1 gm of Tranexamic Acid, and 10 mg of Decadron administered, the patient was positioned supine with the right thigh tourniquet placed.  The  right lower extremity was prepped and draped in sterile fashion.  A time-   out was performed identifying the patient, planned procedure, and   extremity.      The right lower extremity was placed in the [facility] leg holder.  The leg was   exsanguinated, tourniquet elevated to 250 mmHg.  A midline incision was  made followed by median parapatellar arthrotomy.  Following initial   exposure, attention was first directed to the patella.  Precut   measurement was noted to be 21 mm.  I resected down to 13-14 mm and used a   38 patellar button to restore patellar height as well as cover the cut   surface.      The lug holes were drilled and a metal shim was placed to protect the   patella from retractors and saw blades.      At this point, attention was now directed to the femur.  The femoral   canal was opened with a drill, irrigated to try to prevent fat emboli.  An   intramedullary rod was passed at 3 degrees valgus, 9 mm of bone was   resected off the distal femur.  Following this resection, the tibia was   subluxated anteriorly.  Using the extramedullary guide, 2 mm of bone was resected off   the proximal lateral tibia.  We confirmed the gap would be   stable medially and laterally with a size 6 mm insert as well as confirmed   the cut was perpendicular in the coronal plane, checking with an alignment rod.      Once this was done, I sized the femur to be a size 3 in the anterior-   posterior dimension, chose a standard component based on  medial and   lateral dimension.  The size 3 rotation block was then pinned in   position anterior referenced using the C-clamp to set rotation.  The   anterior, posterior, and  chamfer cuts were made without difficulty nor   notching making certain that I was along the anterior cortex to help   with flexion gap stability.      The final box cut was made off the lateral aspect of distal femur.      At this point, the tibia was sized to be a size 4, the size 4 tray was   then pinned in position through the medial third of the tubercle,   drilled, and keel punched.  Trial reduction was now carried with a 3 femur,  4 tibia, a size 7 then 8 mm insert, and the 38 patella botton.  The knee was brought to   extension, full extension with good flexion stability with the patella   tracking through the trochlea without application of pressure.  Given   all these findings I drilled the femoral lug holes and then removed the trial components.  Final components were   opened and cement was mixed.  The knee was irrigated with normal saline   solution and pulse lavage.  The synovial lining was   then injected with 30 cc of 0.25% Marcaine with epinephrine and 1 cc of Toradol plus 30 cc of NS for a   total of 61 cc.      The knee was irrigated.  Final implants were then cemented onto clean and   dried cut surfaces of bone with the knee brought to extension with a size 8 mm trial insert.      Once the cement had fully cured, the excess cement was removed   throughout the knee.  I confirmed I was satisfied with the range of   motion and stability, and the final size 8 mm PS AOX insert was chosen.  It was   placed into the knee.      The tourniquet had been let down at  28 minutes.  No significant   hemostasis required.  The   extensor mechanism was then reapproximated using #1 Vicryl and #0 with the knee   in flexion.  The   remaining wound was closed with 2-0 Vicryl and running 4-0 Monocryl.   The knee  was cleaned, dried, dressed sterilely using Dermabond and   Aquacel dressing.  The patient was then   brought to recovery room in stable condition, tolerating the procedure   well.   Please note that Physician Assistant, Danae Orleans, PA-C, was present for the entirety of the case, and was utilized for pre-operative positioning, peri-operative retractor management, general facilitation of the procedure.  He was also utilized for primary wound closure at the end of the case.              Pietro Cassis Alvan Dame, M.D.    04/09/2015 10:44 AM

## 2015-04-10 LAB — CBC
HCT: 31 % — ABNORMAL LOW (ref 36.0–46.0)
Hemoglobin: 10.3 g/dL — ABNORMAL LOW (ref 12.0–15.0)
MCH: 31.5 pg (ref 26.0–34.0)
MCHC: 33.2 g/dL (ref 30.0–36.0)
MCV: 94.8 fL (ref 78.0–100.0)
Platelets: 226 10*3/uL (ref 150–400)
RBC: 3.27 MIL/uL — ABNORMAL LOW (ref 3.87–5.11)
RDW: 13.3 % (ref 11.5–15.5)
WBC: 12.7 10*3/uL — ABNORMAL HIGH (ref 4.0–10.5)

## 2015-04-10 LAB — BASIC METABOLIC PANEL
Anion gap: 11 (ref 5–15)
BUN: 13 mg/dL (ref 6–20)
CO2: 28 mmol/L (ref 22–32)
Calcium: 8.7 mg/dL — ABNORMAL LOW (ref 8.9–10.3)
Chloride: 100 mmol/L — ABNORMAL LOW (ref 101–111)
Creatinine, Ser: 0.63 mg/dL (ref 0.44–1.00)
GFR calc Af Amer: >60 mL/min (ref >60)
GFR calc non Af Amer: >60 mL/min (ref >60)
Glucose, Bld: 111 mg/dL — ABNORMAL HIGH (ref 65–99)
Potassium: 4.7 mmol/L (ref 3.5–5.1)
Sodium: 139 mmol/L (ref 135–145)

## 2015-04-10 MED ORDER — FERROUS SULFATE 325 (65 FE) MG PO TABS
325.0000 mg | ORAL_TABLET | Freq: Three times a day (TID) | ORAL | Status: AC
Start: 2015-04-10 — End: ?

## 2015-04-10 MED ORDER — ASPIRIN 325 MG PO TBEC: 325.0000 mg | DELAYED_RELEASE_TABLET | Freq: Two times a day (BID) | ORAL | Status: AC

## 2015-04-10 MED ORDER — DOCUSATE SODIUM 100 MG PO CAPS: 100.0000 mg | ORAL_CAPSULE | Freq: Two times a day (BID) | ORAL | Status: DC

## 2015-04-10 MED ORDER — POLYETHYLENE GLYCOL 3350 17 G PO PACK: 17.0000 g | PACK | Freq: Two times a day (BID) | ORAL | Status: AC

## 2015-04-10 MED ORDER — OXYCODONE HCL 5 MG PO TABS
5.0000 mg | ORAL_TABLET | ORAL | Status: DC | PRN
Start: 2015-04-10 — End: 2016-07-01

## 2015-04-10 MED ORDER — ACETAMINOPHEN 500 MG PO TABS: 1000.0000 mg | ORAL_TABLET | Freq: Three times a day (TID) | ORAL | Status: DC

## 2015-04-10 MED ORDER — METHOCARBAMOL 500 MG PO TABS: 500.0000 mg | ORAL_TABLET | Freq: Four times a day (QID) | ORAL | Status: AC | PRN

## 2015-04-10 NOTE — Discharge Summary (Signed)
Physician Discharge Summary  Patient ID: CARMAN BUWALDA MRN: [id number] DOB/AGE: [birth year] 65 y.o.  Admit date: 04/09/2015 Discharge date: 04/10/2015   Procedures:  Procedure(s) (LRB): TOTAL KNEE ARTHROPLASTY (Right)  Attending Physician:  Dr. Paralee Cancel   Admission Diagnoses:   Right knee primary OA / pain  Discharge Diagnoses:  Principal Problem:   S/P right TKA Active Problems:   S/P knee replacement  Past Medical History  Diagnosis Date  . 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 Peripheral edema     takes Furosemide daily  . History of migraine     last one about 62months ago  . Weakness     and numbness both hands  . Peripheral neuropathy ([facility])     takes Lyrica bid  . Arthritis   . Joint pain   . Chronic back pain   . Bruises easily   . GERD (gastroesophageal reflux disease)     takes Nexium bid  . Constipation     r/t pain meds;takes OTC stool softener  . History of colon polyps   . Urinary frequency   . Urinary urgency   . History of blood transfusion     no abnormal reaction noted  . Depression     takes Celexa daily  . Anxiety     takes Klonopin tid  . Insomnia     takes Lunesta nightly  . History of MRSA infection 2009  . History of staph infection 2009  . Headache     HPI:     Amanda Guerrero, 65 y.o. female, has a history of pain and functional disability in the right knee due to arthritis and has failed non-surgical conservative treatments for greater than 12 weeks to include NSAID's and/or analgesics, corticosteriod injections, use of assistive devices and activity modification. Onset of symptoms was gradual, starting ~1 years ago with gradually worsening course since that time. The patient noted prior procedures on the knee to include arthroscopy on the right knee(s). Patient currently rates pain in the right knee(s) at 8 out of 10 with activity. Patient has night pain, worsening of pain with activity and weight bearing, pain that  interferes with activities of daily living, pain with passive range of motion, crepitus and joint swelling. Patient has evidence of periarticular osteophytes and joint space narrowing by imaging studies. There is no active infection. Risks, benefits and expectations were discussed with the patient. Risks including but not limited to the risk of anesthesia, blood clots, nerve damage, blood vessel damage, failure of the prosthesis, infection and up to and including death. Patient understand the risks, benefits and expectations and wishes to proceed with surgery.   PCP: Cher Nakai, MD   Discharged Condition: good  Hospital Course:  Patient underwent the above stated procedure on 04/09/2015. Patient tolerated the procedure well and brought to the recovery room in good condition and subsequently to the floor.  POD #1 BP: 101/54 ; Pulse: 66 ; Temp: 98.3 F (36.8 C) ; Resp: 18 Patient reports pain as moderate. No activity yesterday. Ready to get back to normal activities soon, motivated. Dorsiflexion/plantar flexion intact, incision: dressing C/D/I, no cellulitis present and compartment soft.   LABS  Basename    HGB     10.3  HCT     31.0    Discharge Exam: General appearance: alert, cooperative and no distress Extremities: Homans sign is negative, no sign of DVT, no edema, redness or tenderness in the calves or thighs and no  ulcers, gangrene or trophic changes  Disposition: Home with follow up in 2 weeks   Follow-up Information    Follow up with Mauri Pole, MD. Schedule an appointment as soon as possible for a visit in 2 weeks.   Specialty:  Orthopedic Surgery   Contact information:   [address] [place] [address] [id number]       Discharge Instructions    Call MD / Call 911    Complete by:  As directed   If you experience chest pain or shortness of breath, CALL 911 and be transported to the hospital emergency room.  If you develope a fever above  101 F, pus (white drainage) or increased drainage or redness at the wound, or calf pain, call your surgeon's office.     Change dressing    Complete by:  As directed   Maintain surgical dressing until follow up in the clinic. If the edges start to pull up, may reinforce with tape. If the dressing is no longer working, may remove and cover with gauze and tape, but must keep the area dry and clean.  Call with any questions or concerns.     Constipation Prevention    Complete by:  As directed   Drink plenty of fluids.  Prune juice may be helpful.  You may use a stool softener, such as Colace (over the counter) 100 mg twice a day.  Use MiraLax (over the counter) for constipation as needed.     Diet - low sodium heart healthy    Complete by:  As directed      Discharge instructions    Complete by:  As directed   Maintain surgical dressing until follow up in the clinic. If the edges start to pull up, may reinforce with tape. If the dressing is no longer working, may remove and cover with gauze and tape, but must keep the area dry and clean.  Follow up in 2 weeks at [facility]. Call with any questions or concerns.     Increase activity slowly as tolerated    Complete by:  As directed   Weight bearing as tolerated with assist device (walker, cane, etc) as directed, use it as long as suggested by your surgeon or therapist, typically at least 4-6 weeks.     TED hose    Complete by:  As directed   Use stockings (TED hose) for 2 weeks on both leg(s).  You may remove them at night for sleeping.             Medication List    STOP taking these medications        oxyCODONE-acetaminophen 10-325 MG tablet  Commonly known as:  PERCOCET     polyethylene glycol powder powder  Commonly known as:  GLYCOLAX  Replaced by:  polyethylene glycol packet      TAKE these medications        acetaminophen 500 MG tablet  Commonly known as:  TYLENOL  Take 2 tablets (1,000 mg total) by mouth every  8 (eight) hours.     aspirin 325 MG EC tablet  Take 1 tablet (325 mg total) by mouth 2 (two) times daily.     citalopram 40 MG tablet  Commonly known as:  CELEXA  Take 40 mg by mouth daily.     clonazePAM 0.5 MG tablet  Commonly known as:  KLONOPIN  Take 0.5 mg by mouth every 8 (eight) hours.     CRANBERRY PLUS  VITAMIN C PO  Take 3 tablets by mouth at bedtime.     docusate sodium 100 MG capsule  Commonly known as:  COLACE  Take 1 capsule (100 mg total) by mouth 2 (two) times daily.     ferrous sulfate 325 (65 FE) MG tablet  Take 1 tablet (325 mg total) by mouth 3 (three) times daily after meals.     furosemide 40 MG tablet  Commonly known as:  LASIX  Take 60 mg by mouth daily.     methocarbamol 500 MG tablet  Commonly known as:  ROBAXIN  Take 1 tablet (500 mg total) by mouth every 6 (six) hours as needed for muscle spasms.     morphine 30 MG 12 hr tablet  Commonly known as:  MS CONTIN  Take 30 mg by mouth 2 (two) times daily.     oxyCODONE 5 MG immediate release tablet  Commonly known as:  Oxy IR/ROXICODONE  Take 1-3 tablets (5-15 mg total) by mouth every 4 (four) hours as needed for severe pain.     pantoprazole 40 MG tablet  Commonly known as:  PROTONIX  Take 40 mg by mouth 2 (two) times daily.     polyethylene glycol packet  Commonly known as:  MIRALAX / GLYCOLAX  Take 17 g by mouth 2 (two) times daily.     pregabalin 75 MG capsule  Commonly known as:  LYRICA  Take 75 mg by mouth 3 (three) times daily.     traZODone 100 MG tablet  Commonly known as:  DESYREL  Take 100 mg by mouth at bedtime.         Signed: [place] Pugh. Melek Pownall   PA-C  04/10/2015, 3:38 PM

## 2015-04-10 NOTE — Progress Notes (Signed)
OT Cancellation Note  Patient Details Name: DEJAI RUEBUSH MRN: [id number] DOB: 02-17-1951   Cancelled Treatment:    Reason Eval/Treat Not Completed: OT screened, no needs identified, will sign off  [place], Thereasa Parkin 04/10/2015, 12:14 PM

## 2015-04-10 NOTE — Progress Notes (Signed)
Physical Therapy Treatment Patient Details Name: Amanda Guerrero MRN: [id number] DOB: 01-14-1951 Today's Date: 04/10/2015    History of Present Illness R TKA    PT Comments    Stair training completed. Reviewed home exercise program, pt is independent with ambulation with RW. She is ready to DC home from PT standpoint.   Follow Up Recommendations  Outpatient PT     Equipment Recommendations  None recommended by PT    Recommendations for Other Services OT consult     Precautions / Restrictions Precautions Precautions: Knee;Fall Restrictions Weight Bearing Restrictions: No Other Position/Activity Restrictions: WBAT RLE    Mobility  Bed Mobility               General bed mobility comments: NT- up in chair  Transfers Overall transfer level: Needs assistance Equipment used: Rolling walker (2 wheeled) Transfers: Sit to/from Stand Sit to Stand: Supervision         General transfer comment: verbal cues for hand placement  Ambulation/Gait Ambulation/Gait assistance: modified independent Ambulation Distance (Feet): 120 Feet Assistive device: Rolling walker (2 wheeled) Gait Pattern/deviations: Step-through pattern   Gait velocity interpretation: Below normal speed for age/gender General Gait Details: steady with RW, no LOB, good sequencing   Stairs Stairs: Yes Stairs assistance: Supervision Stair Management: Two rails;Step to pattern;Forwards Number of Stairs: 3 General stair comments: verbal ques for sequencing  Wheelchair Mobility    Modified Rankin (Stroke Patients Only)       Balance Overall balance assessment: Modified Independent                                  Cognition Arousal/Alertness: Awake/alert Behavior During Therapy: WFL for tasks assessed/performed Overall Cognitive Status: Within Functional Limits for tasks assessed                      Exercises Total Joint Exercises Ankle Circles/Pumps: AROM;Both;10  reps;Supine Quad Sets: AROM;Both;10 reps;Supine Short Arc Quad: AAROM;Right;10 reps;Supine Heel Slides: AAROM;Right;10 reps;Supine Hip ABduction/ADduction: AAROM;Right;10 reps;Supine Straight Leg Raises: AAROM;Right;10 reps;Supine Long Arc Quad: AAROM;Right;10 reps;Seated Knee Flexion: AAROM;Right;10 reps;Seated Goniometric ROM: 5-60* AAROM R knee    General Comments        Pertinent Vitals/Pain Pain Assessment: 0-10 Pain Score: 5  Pain Location: R knee with walking Pain Descriptors / Indicators: Sore Pain Intervention(s): Limited activity within patient's tolerance;Monitored during session;Premedicated before session;Repositioned;Ice applied    Home Living Family/patient expects to be discharged to:: Private residence Living Arrangements: Other (Comment) (care taker) Available Help at Discharge: Family;Available 24 hours/day   Home Access: Stairs to enter Entrance Stairs-Rails: Can reach both;Left;Right Home Layout: One level Home Equipment: Walker - 2 wheels;Bedside commode;Cane - single point;Crutches;Shower seat;Hand held shower head      Prior Function Level of Independence: Independent with assistive device(s)      Comments: used RW   PT Goals (current goals can now be found in the care plan section) Acute Rehab PT Goals Patient Stated Goal: to work in garden PT Goal Formulation: With patient Time For Goal Achievement: 04/17/15 Potential to Achieve Goals: Good Progress towards PT goals: Progressing toward goals    Frequency  7X/week    PT Plan Current plan remains appropriate    Co-evaluation             End of Session Equipment Utilized During Treatment: Gait belt Activity Tolerance: Patient tolerated treatment well Patient left: in chair;with call bell/phone  within reach     Time: 1301-1320 PT Time Calculation (min) (ACUTE ONLY): 19 min  Charges:  $Gait Training: 8-22 mins                    G Codes:      Philomena Doheny 04/10/2015, 1:54 PM [phone]

## 2015-04-10 NOTE — Care Management Note (Signed)
Case Management Note  Patient Details  Name: Amanda Guerrero MRN: [id number] Date of Birth: Feb 19, 1951  Subjective/Objective:                   TOTAL KNEE ARTHROPLASTY (Right) Action/Plan:  Discharge planning Expected Discharge Date:  04/10/15              Expected Discharge Plan:  Home/Self Care  In-House Referral:     Discharge planning Services  CM Consult  Post Acute Care Choice:    Choice offered to:     DME Arranged:  N/A DME Agency:     HH Arranged:  NA HH Agency:  NA  Status of Service:  Completed, signed off  Medicare Important Message Given:    Date Medicare IM Given:    Medicare IM give by:    Date Additional Medicare IM Given:    Additional Medicare Important Message give by:     If discussed at [place] of Stay Meetings, dates discussed:    Additional Comments: CM confirms with pt no home health and pt has been set up with outpt therapy.  Pt needs no DME.  No other CM needs were communicated. Dellie Catholic, RN 04/10/2015, 9:41 AM

## 2015-04-10 NOTE — Progress Notes (Signed)
Patient ID: Amanda Guerrero, female   DOB: 30-Mar-1950, 65 y.o.   MRN: [id number] Subjective: 1 Day Post-Op Procedure(s) (LRB): TOTAL KNEE ARTHROPLASTY (Right)    Patient reports pain as moderate.  No activity yesterday.  Ready to get back to normal activities soon, motivated  Objective:   VITALS:   Filed Vitals:   04/09/15 1800 04/10/15 0043  BP: 97/53 101/54  Pulse: 74 66  Temp: 98.6 F (37 C) 98.3 F (36.8 C)  Resp: 18 18    Neurovascular intact Incision: dressing C/D/I  LABS  Recent Labs  04/10/15 0420  HGB 10.3*  HCT 31.0*  WBC 12.7*  PLT 226     Recent Labs  04/10/15 0420  NA 139  K 4.7  BUN 13  CREATININE 0.63  GLUCOSE 111*    No results for input(s): LABPT, INR in the last 72 hours.   Assessment/Plan: 1 Day Post-Op Procedure(s) (LRB): TOTAL KNEE ARTHROPLASTY (Right)   Advance diet Up with therapy Discharge home with home health today RTC in 2 weeks
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
## 2015-04-10 NOTE — Progress Notes (Signed)
Discharge instructions given to patient, aptient to have out patent therapy  D Mateo Flow RN

## 2015-04-12 DIAGNOSIS — R2689 Other abnormalities of gait and mobility: Secondary | ICD-10-CM | POA: Diagnosis not present

## 2015-04-12 DIAGNOSIS — Z96651 Presence of right artificial knee joint: Secondary | ICD-10-CM | POA: Diagnosis not present

## 2015-04-12 DIAGNOSIS — M79651 Pain in right thigh: Secondary | ICD-10-CM | POA: Diagnosis not present

## 2015-04-12 DIAGNOSIS — R2681 Unsteadiness on feet: Secondary | ICD-10-CM | POA: Diagnosis not present

## 2015-04-12 DIAGNOSIS — M25461 Effusion, right knee: Secondary | ICD-10-CM | POA: Diagnosis not present

## 2015-04-12 DIAGNOSIS — M25661 Stiffness of right knee, not elsewhere classified: Secondary | ICD-10-CM | POA: Diagnosis not present

## 2015-04-12 DIAGNOSIS — M6281 Muscle weakness (generalized): Secondary | ICD-10-CM | POA: Diagnosis not present

## 2015-04-12 DIAGNOSIS — M25561 Pain in right knee: Secondary | ICD-10-CM | POA: Diagnosis not present

## 2015-04-13 DIAGNOSIS — M79651 Pain in right thigh: Secondary | ICD-10-CM | POA: Diagnosis not present

## 2015-04-13 DIAGNOSIS — Z96651 Presence of right artificial knee joint: Secondary | ICD-10-CM | POA: Diagnosis not present

## 2015-04-13 DIAGNOSIS — M25461 Effusion, right knee: Secondary | ICD-10-CM | POA: Diagnosis not present

## 2015-04-13 DIAGNOSIS — M25561 Pain in right knee: Secondary | ICD-10-CM | POA: Diagnosis not present

## 2015-04-13 DIAGNOSIS — M25661 Stiffness of right knee, not elsewhere classified: Secondary | ICD-10-CM | POA: Diagnosis not present

## 2015-04-13 DIAGNOSIS — M6281 Muscle weakness (generalized): Secondary | ICD-10-CM | POA: Diagnosis not present

## 2015-04-16 DIAGNOSIS — M25661 Stiffness of right knee, not elsewhere classified: Secondary | ICD-10-CM | POA: Diagnosis not present

## 2015-04-16 DIAGNOSIS — M79651 Pain in right thigh: Secondary | ICD-10-CM | POA: Diagnosis not present

## 2015-04-16 DIAGNOSIS — M25561 Pain in right knee: Secondary | ICD-10-CM | POA: Diagnosis not present

## 2015-04-16 DIAGNOSIS — Z96651 Presence of right artificial knee joint: Secondary | ICD-10-CM | POA: Diagnosis not present

## 2015-04-16 DIAGNOSIS — M6281 Muscle weakness (generalized): Secondary | ICD-10-CM | POA: Diagnosis not present

## 2015-04-16 DIAGNOSIS — M25461 Effusion, right knee: Secondary | ICD-10-CM | POA: Diagnosis not present

## 2015-04-18 DIAGNOSIS — M6281 Muscle weakness (generalized): Secondary | ICD-10-CM | POA: Diagnosis not present

## 2015-04-18 DIAGNOSIS — Z96651 Presence of right artificial knee joint: Secondary | ICD-10-CM | POA: Diagnosis not present

## 2015-04-18 DIAGNOSIS — M25561 Pain in right knee: Secondary | ICD-10-CM | POA: Diagnosis not present

## 2015-04-18 DIAGNOSIS — M25661 Stiffness of right knee, not elsewhere classified: Secondary | ICD-10-CM | POA: Diagnosis not present

## 2015-04-18 DIAGNOSIS — M25461 Effusion, right knee: Secondary | ICD-10-CM | POA: Diagnosis not present

## 2015-04-18 DIAGNOSIS — M79651 Pain in right thigh: Secondary | ICD-10-CM | POA: Diagnosis not present

## 2015-04-20 DIAGNOSIS — Z96651 Presence of right artificial knee joint: Secondary | ICD-10-CM | POA: Diagnosis not present

## 2015-04-20 DIAGNOSIS — M6281 Muscle weakness (generalized): Secondary | ICD-10-CM | POA: Diagnosis not present

## 2015-04-20 DIAGNOSIS — M79651 Pain in right thigh: Secondary | ICD-10-CM | POA: Diagnosis not present

## 2015-04-20 DIAGNOSIS — M25661 Stiffness of right knee, not elsewhere classified: Secondary | ICD-10-CM | POA: Diagnosis not present

## 2015-04-20 DIAGNOSIS — M25561 Pain in right knee: Secondary | ICD-10-CM | POA: Diagnosis not present

## 2015-04-20 DIAGNOSIS — M25461 Effusion, right knee: Secondary | ICD-10-CM | POA: Diagnosis not present

## 2015-04-23 DIAGNOSIS — Z96651 Presence of right artificial knee joint: Secondary | ICD-10-CM | POA: Diagnosis not present

## 2015-04-23 DIAGNOSIS — M79651 Pain in right thigh: Secondary | ICD-10-CM | POA: Diagnosis not present

## 2015-04-23 DIAGNOSIS — M25661 Stiffness of right knee, not elsewhere classified: Secondary | ICD-10-CM | POA: Diagnosis not present

## 2015-04-23 DIAGNOSIS — M25561 Pain in right knee: Secondary | ICD-10-CM | POA: Diagnosis not present

## 2015-04-23 DIAGNOSIS — M6281 Muscle weakness (generalized): Secondary | ICD-10-CM | POA: Diagnosis not present

## 2015-04-23 DIAGNOSIS — M25461 Effusion, right knee: Secondary | ICD-10-CM | POA: Diagnosis not present

## 2015-04-25 DIAGNOSIS — Z96651 Presence of right artificial knee joint: Secondary | ICD-10-CM | POA: Diagnosis not present

## 2015-04-25 DIAGNOSIS — M6281 Muscle weakness (generalized): Secondary | ICD-10-CM | POA: Diagnosis not present

## 2015-04-25 DIAGNOSIS — M25461 Effusion, right knee: Secondary | ICD-10-CM | POA: Diagnosis not present

## 2015-04-25 DIAGNOSIS — R2681 Unsteadiness on feet: Secondary | ICD-10-CM | POA: Diagnosis not present

## 2015-04-25 DIAGNOSIS — M25651 Stiffness of right hip, not elsewhere classified: Secondary | ICD-10-CM | POA: Diagnosis not present

## 2015-04-25 DIAGNOSIS — M25661 Stiffness of right knee, not elsewhere classified: Secondary | ICD-10-CM | POA: Diagnosis not present

## 2015-04-25 DIAGNOSIS — R2689 Other abnormalities of gait and mobility: Secondary | ICD-10-CM | POA: Diagnosis not present

## 2015-04-25 DIAGNOSIS — M25561 Pain in right knee: Secondary | ICD-10-CM | POA: Diagnosis not present

## 2015-04-26 DIAGNOSIS — Z96651 Presence of right artificial knee joint: Secondary | ICD-10-CM | POA: Diagnosis not present

## 2015-04-26 DIAGNOSIS — Z471 Aftercare following joint replacement surgery: Secondary | ICD-10-CM | POA: Diagnosis not present

## 2015-04-27 DIAGNOSIS — M25461 Effusion, right knee: Secondary | ICD-10-CM | POA: Diagnosis not present

## 2015-04-27 DIAGNOSIS — M25561 Pain in right knee: Secondary | ICD-10-CM | POA: Diagnosis not present

## 2015-04-27 DIAGNOSIS — Z96651 Presence of right artificial knee joint: Secondary | ICD-10-CM | POA: Diagnosis not present

## 2015-04-27 DIAGNOSIS — M25651 Stiffness of right hip, not elsewhere classified: Secondary | ICD-10-CM | POA: Diagnosis not present

## 2015-04-27 DIAGNOSIS — M25661 Stiffness of right knee, not elsewhere classified: Secondary | ICD-10-CM | POA: Diagnosis not present

## 2015-04-27 DIAGNOSIS — M6281 Muscle weakness (generalized): Secondary | ICD-10-CM | POA: Diagnosis not present

## 2015-05-02 DIAGNOSIS — M25561 Pain in right knee: Secondary | ICD-10-CM | POA: Diagnosis not present

## 2015-05-02 DIAGNOSIS — M6281 Muscle weakness (generalized): Secondary | ICD-10-CM | POA: Diagnosis not present

## 2015-05-02 DIAGNOSIS — M25661 Stiffness of right knee, not elsewhere classified: Secondary | ICD-10-CM | POA: Diagnosis not present

## 2015-05-02 DIAGNOSIS — M25651 Stiffness of right hip, not elsewhere classified: Secondary | ICD-10-CM | POA: Diagnosis not present

## 2015-05-02 DIAGNOSIS — M25461 Effusion, right knee: Secondary | ICD-10-CM | POA: Diagnosis not present

## 2015-05-02 DIAGNOSIS — Z96651 Presence of right artificial knee joint: Secondary | ICD-10-CM | POA: Diagnosis not present

## 2015-05-04 DIAGNOSIS — G609 Hereditary and idiopathic neuropathy, unspecified: Secondary | ICD-10-CM | POA: Diagnosis not present

## 2015-05-04 DIAGNOSIS — M25461 Effusion, right knee: Secondary | ICD-10-CM | POA: Diagnosis not present

## 2015-05-04 DIAGNOSIS — M25661 Stiffness of right knee, not elsewhere classified: Secondary | ICD-10-CM | POA: Diagnosis not present

## 2015-05-04 DIAGNOSIS — Z6822 Body mass index (BMI) 22.0-22.9, adult: Secondary | ICD-10-CM | POA: Diagnosis not present

## 2015-05-04 DIAGNOSIS — M25651 Stiffness of right hip, not elsewhere classified: Secondary | ICD-10-CM | POA: Diagnosis not present

## 2015-05-04 DIAGNOSIS — M6281 Muscle weakness (generalized): Secondary | ICD-10-CM | POA: Diagnosis not present

## 2015-05-04 DIAGNOSIS — M810 Age-related osteoporosis without current pathological fracture: Secondary | ICD-10-CM | POA: Diagnosis not present

## 2015-05-04 DIAGNOSIS — Z96651 Presence of right artificial knee joint: Secondary | ICD-10-CM | POA: Diagnosis not present

## 2015-05-04 DIAGNOSIS — M159 Polyosteoarthritis, unspecified: Secondary | ICD-10-CM | POA: Diagnosis not present

## 2015-05-04 DIAGNOSIS — F329 Major depressive disorder, single episode, unspecified: Secondary | ICD-10-CM | POA: Diagnosis not present

## 2015-05-04 DIAGNOSIS — M25561 Pain in right knee: Secondary | ICD-10-CM | POA: Diagnosis not present

## 2015-05-04 DIAGNOSIS — F411 Generalized anxiety disorder: Secondary | ICD-10-CM | POA: Diagnosis not present

## 2015-05-04 DIAGNOSIS — Z1389 Encounter for screening for other disorder: Secondary | ICD-10-CM | POA: Diagnosis not present

## 2015-05-04 DIAGNOSIS — R609 Edema, unspecified: Secondary | ICD-10-CM | POA: Diagnosis not present

## 2015-05-04 DIAGNOSIS — G4700 Insomnia, unspecified: Secondary | ICD-10-CM | POA: Diagnosis not present

## 2015-05-04 DIAGNOSIS — K219 Gastro-esophageal reflux disease without esophagitis: Secondary | ICD-10-CM | POA: Diagnosis not present

## 2015-05-07 DIAGNOSIS — Z96651 Presence of right artificial knee joint: Secondary | ICD-10-CM | POA: Diagnosis not present

## 2015-05-07 DIAGNOSIS — M25561 Pain in right knee: Secondary | ICD-10-CM | POA: Diagnosis not present

## 2015-05-07 DIAGNOSIS — M6281 Muscle weakness (generalized): Secondary | ICD-10-CM | POA: Diagnosis not present

## 2015-05-07 DIAGNOSIS — M25661 Stiffness of right knee, not elsewhere classified: Secondary | ICD-10-CM | POA: Diagnosis not present

## 2015-05-07 DIAGNOSIS — M25651 Stiffness of right hip, not elsewhere classified: Secondary | ICD-10-CM | POA: Diagnosis not present

## 2015-05-07 DIAGNOSIS — M25461 Effusion, right knee: Secondary | ICD-10-CM | POA: Diagnosis not present

## 2015-05-08 DIAGNOSIS — M961 Postlaminectomy syndrome, not elsewhere classified: Secondary | ICD-10-CM | POA: Diagnosis not present

## 2015-05-08 DIAGNOSIS — Z79891 Long term (current) use of opiate analgesic: Secondary | ICD-10-CM | POA: Diagnosis not present

## 2015-05-08 DIAGNOSIS — G894 Chronic pain syndrome: Secondary | ICD-10-CM | POA: Diagnosis not present

## 2015-05-09 DIAGNOSIS — M25661 Stiffness of right knee, not elsewhere classified: Secondary | ICD-10-CM | POA: Diagnosis not present

## 2015-05-09 DIAGNOSIS — M25651 Stiffness of right hip, not elsewhere classified: Secondary | ICD-10-CM | POA: Diagnosis not present

## 2015-05-09 DIAGNOSIS — M25561 Pain in right knee: Secondary | ICD-10-CM | POA: Diagnosis not present

## 2015-05-09 DIAGNOSIS — Z96651 Presence of right artificial knee joint: Secondary | ICD-10-CM | POA: Diagnosis not present

## 2015-05-09 DIAGNOSIS — M6281 Muscle weakness (generalized): Secondary | ICD-10-CM | POA: Diagnosis not present

## 2015-05-09 DIAGNOSIS — M25461 Effusion, right knee: Secondary | ICD-10-CM | POA: Diagnosis not present

## 2015-05-14 DIAGNOSIS — M25651 Stiffness of right hip, not elsewhere classified: Secondary | ICD-10-CM | POA: Diagnosis not present

## 2015-05-14 DIAGNOSIS — M25661 Stiffness of right knee, not elsewhere classified: Secondary | ICD-10-CM | POA: Diagnosis not present

## 2015-05-14 DIAGNOSIS — Z96651 Presence of right artificial knee joint: Secondary | ICD-10-CM | POA: Diagnosis not present

## 2015-05-14 DIAGNOSIS — M25561 Pain in right knee: Secondary | ICD-10-CM | POA: Diagnosis not present

## 2015-05-14 DIAGNOSIS — M25461 Effusion, right knee: Secondary | ICD-10-CM | POA: Diagnosis not present

## 2015-05-14 DIAGNOSIS — M6281 Muscle weakness (generalized): Secondary | ICD-10-CM | POA: Diagnosis not present

## 2015-05-16 DIAGNOSIS — M25561 Pain in right knee: Secondary | ICD-10-CM | POA: Diagnosis not present

## 2015-05-16 DIAGNOSIS — M25651 Stiffness of right hip, not elsewhere classified: Secondary | ICD-10-CM | POA: Diagnosis not present

## 2015-05-16 DIAGNOSIS — M25661 Stiffness of right knee, not elsewhere classified: Secondary | ICD-10-CM | POA: Diagnosis not present

## 2015-05-16 DIAGNOSIS — M6281 Muscle weakness (generalized): Secondary | ICD-10-CM | POA: Diagnosis not present

## 2015-05-16 DIAGNOSIS — Z96651 Presence of right artificial knee joint: Secondary | ICD-10-CM | POA: Diagnosis not present

## 2015-05-16 DIAGNOSIS — M25461 Effusion, right knee: Secondary | ICD-10-CM | POA: Diagnosis not present

## 2015-05-17 DIAGNOSIS — M25651 Stiffness of right hip, not elsewhere classified: Secondary | ICD-10-CM | POA: Diagnosis not present

## 2015-05-17 DIAGNOSIS — Z96651 Presence of right artificial knee joint: Secondary | ICD-10-CM | POA: Diagnosis not present

## 2015-05-17 DIAGNOSIS — M6281 Muscle weakness (generalized): Secondary | ICD-10-CM | POA: Diagnosis not present

## 2015-05-17 DIAGNOSIS — M25661 Stiffness of right knee, not elsewhere classified: Secondary | ICD-10-CM | POA: Diagnosis not present

## 2015-05-17 DIAGNOSIS — M25461 Effusion, right knee: Secondary | ICD-10-CM | POA: Diagnosis not present

## 2015-05-17 DIAGNOSIS — M25561 Pain in right knee: Secondary | ICD-10-CM | POA: Diagnosis not present

## 2015-05-25 DIAGNOSIS — M79651 Pain in right thigh: Secondary | ICD-10-CM | POA: Diagnosis not present

## 2015-05-25 DIAGNOSIS — M25661 Stiffness of right knee, not elsewhere classified: Secondary | ICD-10-CM | POA: Diagnosis not present

## 2015-05-25 DIAGNOSIS — R2689 Other abnormalities of gait and mobility: Secondary | ICD-10-CM | POA: Diagnosis not present

## 2015-05-25 DIAGNOSIS — R2681 Unsteadiness on feet: Secondary | ICD-10-CM | POA: Diagnosis not present

## 2015-05-25 DIAGNOSIS — M6281 Muscle weakness (generalized): Secondary | ICD-10-CM | POA: Diagnosis not present

## 2015-05-25 DIAGNOSIS — M25461 Effusion, right knee: Secondary | ICD-10-CM | POA: Diagnosis not present

## 2015-05-25 DIAGNOSIS — Z96651 Presence of right artificial knee joint: Secondary | ICD-10-CM | POA: Diagnosis not present

## 2015-05-28 DIAGNOSIS — M6281 Muscle weakness (generalized): Secondary | ICD-10-CM | POA: Diagnosis not present

## 2015-05-28 DIAGNOSIS — M79651 Pain in right thigh: Secondary | ICD-10-CM | POA: Diagnosis not present

## 2015-05-28 DIAGNOSIS — M25661 Stiffness of right knee, not elsewhere classified: Secondary | ICD-10-CM | POA: Diagnosis not present

## 2015-05-28 DIAGNOSIS — R2689 Other abnormalities of gait and mobility: Secondary | ICD-10-CM | POA: Diagnosis not present

## 2015-05-28 DIAGNOSIS — Z96651 Presence of right artificial knee joint: Secondary | ICD-10-CM | POA: Diagnosis not present

## 2015-05-28 DIAGNOSIS — M25461 Effusion, right knee: Secondary | ICD-10-CM | POA: Diagnosis not present

## 2015-05-30 DIAGNOSIS — Z471 Aftercare following joint replacement surgery: Secondary | ICD-10-CM | POA: Diagnosis not present

## 2015-05-30 DIAGNOSIS — Z96651 Presence of right artificial knee joint: Secondary | ICD-10-CM | POA: Diagnosis not present

## 2015-06-01 DIAGNOSIS — M810 Age-related osteoporosis without current pathological fracture: Secondary | ICD-10-CM | POA: Diagnosis not present

## 2015-06-01 DIAGNOSIS — R609 Edema, unspecified: Secondary | ICD-10-CM | POA: Diagnosis not present

## 2015-06-01 DIAGNOSIS — K219 Gastro-esophageal reflux disease without esophagitis: Secondary | ICD-10-CM | POA: Diagnosis not present

## 2015-06-01 DIAGNOSIS — M159 Polyosteoarthritis, unspecified: Secondary | ICD-10-CM | POA: Diagnosis not present

## 2015-06-01 DIAGNOSIS — G4700 Insomnia, unspecified: Secondary | ICD-10-CM | POA: Diagnosis not present

## 2015-06-01 DIAGNOSIS — F411 Generalized anxiety disorder: Secondary | ICD-10-CM | POA: Diagnosis not present

## 2015-06-01 DIAGNOSIS — Z9181 History of falling: Secondary | ICD-10-CM | POA: Diagnosis not present

## 2015-06-01 DIAGNOSIS — G609 Hereditary and idiopathic neuropathy, unspecified: Secondary | ICD-10-CM | POA: Diagnosis not present

## 2015-06-01 DIAGNOSIS — F329 Major depressive disorder, single episode, unspecified: Secondary | ICD-10-CM | POA: Diagnosis not present

## 2015-06-01 DIAGNOSIS — Z6822 Body mass index (BMI) 22.0-22.9, adult: Secondary | ICD-10-CM | POA: Diagnosis not present

## 2015-06-29 DIAGNOSIS — L0390 Cellulitis, unspecified: Secondary | ICD-10-CM | POA: Diagnosis not present

## 2015-06-29 DIAGNOSIS — M159 Polyosteoarthritis, unspecified: Secondary | ICD-10-CM | POA: Diagnosis not present

## 2015-06-29 DIAGNOSIS — R609 Edema, unspecified: Secondary | ICD-10-CM | POA: Diagnosis not present

## 2015-06-29 DIAGNOSIS — F411 Generalized anxiety disorder: Secondary | ICD-10-CM | POA: Diagnosis not present

## 2015-06-29 DIAGNOSIS — G609 Hereditary and idiopathic neuropathy, unspecified: Secondary | ICD-10-CM | POA: Diagnosis not present

## 2015-06-29 DIAGNOSIS — F329 Major depressive disorder, single episode, unspecified: Secondary | ICD-10-CM | POA: Diagnosis not present

## 2015-06-29 DIAGNOSIS — K219 Gastro-esophageal reflux disease without esophagitis: Secondary | ICD-10-CM | POA: Diagnosis not present

## 2015-06-29 DIAGNOSIS — Z6821 Body mass index (BMI) 21.0-21.9, adult: Secondary | ICD-10-CM | POA: Diagnosis not present

## 2015-06-29 DIAGNOSIS — M810 Age-related osteoporosis without current pathological fracture: Secondary | ICD-10-CM | POA: Diagnosis not present

## 2015-06-29 DIAGNOSIS — G4700 Insomnia, unspecified: Secondary | ICD-10-CM | POA: Diagnosis not present

## 2015-07-13 DIAGNOSIS — M7062 Trochanteric bursitis, left hip: Secondary | ICD-10-CM | POA: Diagnosis not present

## 2015-07-13 DIAGNOSIS — Z96649 Presence of unspecified artificial hip joint: Secondary | ICD-10-CM | POA: Diagnosis not present

## 2015-07-13 DIAGNOSIS — S72043A Displaced fracture of base of neck of unspecified femur, initial encounter for closed fracture: Secondary | ICD-10-CM | POA: Diagnosis not present

## 2015-07-16 DIAGNOSIS — G609 Hereditary and idiopathic neuropathy, unspecified: Secondary | ICD-10-CM | POA: Diagnosis not present

## 2015-07-16 DIAGNOSIS — F329 Major depressive disorder, single episode, unspecified: Secondary | ICD-10-CM | POA: Diagnosis not present

## 2015-07-16 DIAGNOSIS — K219 Gastro-esophageal reflux disease without esophagitis: Secondary | ICD-10-CM | POA: Diagnosis not present

## 2015-07-16 DIAGNOSIS — M810 Age-related osteoporosis without current pathological fracture: Secondary | ICD-10-CM | POA: Diagnosis not present

## 2015-07-16 DIAGNOSIS — G4700 Insomnia, unspecified: Secondary | ICD-10-CM | POA: Diagnosis not present

## 2015-07-16 DIAGNOSIS — R609 Edema, unspecified: Secondary | ICD-10-CM | POA: Diagnosis not present

## 2015-07-16 DIAGNOSIS — M159 Polyosteoarthritis, unspecified: Secondary | ICD-10-CM | POA: Diagnosis not present

## 2015-07-16 DIAGNOSIS — Z6821 Body mass index (BMI) 21.0-21.9, adult: Secondary | ICD-10-CM | POA: Diagnosis not present

## 2015-07-16 DIAGNOSIS — F411 Generalized anxiety disorder: Secondary | ICD-10-CM | POA: Diagnosis not present

## 2015-07-24 DIAGNOSIS — M25561 Pain in right knee: Secondary | ICD-10-CM | POA: Diagnosis not present

## 2015-07-24 DIAGNOSIS — M6281 Muscle weakness (generalized): Secondary | ICD-10-CM | POA: Diagnosis not present

## 2015-07-24 DIAGNOSIS — M25552 Pain in left hip: Secondary | ICD-10-CM | POA: Diagnosis not present

## 2015-07-24 DIAGNOSIS — M25662 Stiffness of left knee, not elsewhere classified: Secondary | ICD-10-CM | POA: Diagnosis not present

## 2015-07-24 DIAGNOSIS — M7062 Trochanteric bursitis, left hip: Secondary | ICD-10-CM | POA: Diagnosis not present

## 2015-07-24 DIAGNOSIS — M25661 Stiffness of right knee, not elsewhere classified: Secondary | ICD-10-CM | POA: Diagnosis not present

## 2015-07-24 DIAGNOSIS — M25562 Pain in left knee: Secondary | ICD-10-CM | POA: Diagnosis not present

## 2015-07-24 DIAGNOSIS — Z96642 Presence of left artificial hip joint: Secondary | ICD-10-CM | POA: Diagnosis not present

## 2015-07-24 DIAGNOSIS — R2689 Other abnormalities of gait and mobility: Secondary | ICD-10-CM | POA: Diagnosis not present

## 2015-07-27 DIAGNOSIS — M6281 Muscle weakness (generalized): Secondary | ICD-10-CM | POA: Diagnosis not present

## 2015-07-27 DIAGNOSIS — M159 Polyosteoarthritis, unspecified: Secondary | ICD-10-CM | POA: Diagnosis not present

## 2015-07-27 DIAGNOSIS — G609 Hereditary and idiopathic neuropathy, unspecified: Secondary | ICD-10-CM | POA: Diagnosis not present

## 2015-07-27 DIAGNOSIS — M25562 Pain in left knee: Secondary | ICD-10-CM | POA: Diagnosis not present

## 2015-07-27 DIAGNOSIS — K219 Gastro-esophageal reflux disease without esophagitis: Secondary | ICD-10-CM | POA: Diagnosis not present

## 2015-07-27 DIAGNOSIS — M25552 Pain in left hip: Secondary | ICD-10-CM | POA: Diagnosis not present

## 2015-07-27 DIAGNOSIS — F411 Generalized anxiety disorder: Secondary | ICD-10-CM | POA: Diagnosis not present

## 2015-07-27 DIAGNOSIS — M25561 Pain in right knee: Secondary | ICD-10-CM | POA: Diagnosis not present

## 2015-07-27 DIAGNOSIS — M7062 Trochanteric bursitis, left hip: Secondary | ICD-10-CM | POA: Diagnosis not present

## 2015-07-27 DIAGNOSIS — R609 Edema, unspecified: Secondary | ICD-10-CM | POA: Diagnosis not present

## 2015-07-27 DIAGNOSIS — G4700 Insomnia, unspecified: Secondary | ICD-10-CM | POA: Diagnosis not present

## 2015-07-27 DIAGNOSIS — F329 Major depressive disorder, single episode, unspecified: Secondary | ICD-10-CM | POA: Diagnosis not present

## 2015-07-27 DIAGNOSIS — Z96642 Presence of left artificial hip joint: Secondary | ICD-10-CM | POA: Diagnosis not present

## 2015-07-27 DIAGNOSIS — Z6824 Body mass index (BMI) 24.0-24.9, adult: Secondary | ICD-10-CM | POA: Diagnosis not present

## 2015-07-27 DIAGNOSIS — M810 Age-related osteoporosis without current pathological fracture: Secondary | ICD-10-CM | POA: Diagnosis not present

## 2015-08-01 DIAGNOSIS — M25561 Pain in right knee: Secondary | ICD-10-CM | POA: Diagnosis not present

## 2015-08-01 DIAGNOSIS — M25552 Pain in left hip: Secondary | ICD-10-CM | POA: Diagnosis not present

## 2015-08-01 DIAGNOSIS — M6281 Muscle weakness (generalized): Secondary | ICD-10-CM | POA: Diagnosis not present

## 2015-08-01 DIAGNOSIS — Z96642 Presence of left artificial hip joint: Secondary | ICD-10-CM | POA: Diagnosis not present

## 2015-08-01 DIAGNOSIS — M7062 Trochanteric bursitis, left hip: Secondary | ICD-10-CM | POA: Diagnosis not present

## 2015-08-01 DIAGNOSIS — M25562 Pain in left knee: Secondary | ICD-10-CM | POA: Diagnosis not present

## 2015-08-03 DIAGNOSIS — Z96642 Presence of left artificial hip joint: Secondary | ICD-10-CM | POA: Diagnosis not present

## 2015-08-03 DIAGNOSIS — M25561 Pain in right knee: Secondary | ICD-10-CM | POA: Diagnosis not present

## 2015-08-03 DIAGNOSIS — M25552 Pain in left hip: Secondary | ICD-10-CM | POA: Diagnosis not present

## 2015-08-03 DIAGNOSIS — M6281 Muscle weakness (generalized): Secondary | ICD-10-CM | POA: Diagnosis not present

## 2015-08-03 DIAGNOSIS — M25562 Pain in left knee: Secondary | ICD-10-CM | POA: Diagnosis not present

## 2015-08-03 DIAGNOSIS — M7062 Trochanteric bursitis, left hip: Secondary | ICD-10-CM | POA: Diagnosis not present

## 2015-08-06 DIAGNOSIS — M7062 Trochanteric bursitis, left hip: Secondary | ICD-10-CM | POA: Diagnosis not present

## 2015-08-06 DIAGNOSIS — M25561 Pain in right knee: Secondary | ICD-10-CM | POA: Diagnosis not present

## 2015-08-06 DIAGNOSIS — M25552 Pain in left hip: Secondary | ICD-10-CM | POA: Diagnosis not present

## 2015-08-06 DIAGNOSIS — M25562 Pain in left knee: Secondary | ICD-10-CM | POA: Diagnosis not present

## 2015-08-06 DIAGNOSIS — M6281 Muscle weakness (generalized): Secondary | ICD-10-CM | POA: Diagnosis not present

## 2015-08-06 DIAGNOSIS — Z96642 Presence of left artificial hip joint: Secondary | ICD-10-CM | POA: Diagnosis not present

## 2015-08-07 DIAGNOSIS — Z79891 Long term (current) use of opiate analgesic: Secondary | ICD-10-CM | POA: Diagnosis not present

## 2015-08-07 DIAGNOSIS — G894 Chronic pain syndrome: Secondary | ICD-10-CM | POA: Diagnosis not present

## 2015-08-09 DIAGNOSIS — Z96642 Presence of left artificial hip joint: Secondary | ICD-10-CM | POA: Diagnosis not present

## 2015-08-09 DIAGNOSIS — M25562 Pain in left knee: Secondary | ICD-10-CM | POA: Diagnosis not present

## 2015-08-09 DIAGNOSIS — M7062 Trochanteric bursitis, left hip: Secondary | ICD-10-CM | POA: Diagnosis not present

## 2015-08-09 DIAGNOSIS — M25561 Pain in right knee: Secondary | ICD-10-CM | POA: Diagnosis not present

## 2015-08-09 DIAGNOSIS — M25552 Pain in left hip: Secondary | ICD-10-CM | POA: Diagnosis not present

## 2015-08-09 DIAGNOSIS — M6281 Muscle weakness (generalized): Secondary | ICD-10-CM | POA: Diagnosis not present

## 2015-08-15 DIAGNOSIS — M6281 Muscle weakness (generalized): Secondary | ICD-10-CM | POA: Diagnosis not present

## 2015-08-15 DIAGNOSIS — M25552 Pain in left hip: Secondary | ICD-10-CM | POA: Diagnosis not present

## 2015-08-15 DIAGNOSIS — Z96642 Presence of left artificial hip joint: Secondary | ICD-10-CM | POA: Diagnosis not present

## 2015-08-15 DIAGNOSIS — M7062 Trochanteric bursitis, left hip: Secondary | ICD-10-CM | POA: Diagnosis not present

## 2015-08-15 DIAGNOSIS — M25561 Pain in right knee: Secondary | ICD-10-CM | POA: Diagnosis not present

## 2015-08-15 DIAGNOSIS — M25562 Pain in left knee: Secondary | ICD-10-CM | POA: Diagnosis not present

## 2015-08-17 DIAGNOSIS — M25562 Pain in left knee: Secondary | ICD-10-CM | POA: Diagnosis not present

## 2015-08-17 DIAGNOSIS — Z96642 Presence of left artificial hip joint: Secondary | ICD-10-CM | POA: Diagnosis not present

## 2015-08-17 DIAGNOSIS — M6281 Muscle weakness (generalized): Secondary | ICD-10-CM | POA: Diagnosis not present

## 2015-08-17 DIAGNOSIS — M7062 Trochanteric bursitis, left hip: Secondary | ICD-10-CM | POA: Diagnosis not present

## 2015-08-17 DIAGNOSIS — M25552 Pain in left hip: Secondary | ICD-10-CM | POA: Diagnosis not present

## 2015-08-17 DIAGNOSIS — M25561 Pain in right knee: Secondary | ICD-10-CM | POA: Diagnosis not present

## 2015-08-21 DIAGNOSIS — L98499 Non-pressure chronic ulcer of skin of other sites with unspecified severity: Secondary | ICD-10-CM | POA: Diagnosis not present

## 2015-08-21 DIAGNOSIS — M961 Postlaminectomy syndrome, not elsewhere classified: Secondary | ICD-10-CM | POA: Diagnosis not present

## 2015-08-21 DIAGNOSIS — G609 Hereditary and idiopathic neuropathy, unspecified: Secondary | ICD-10-CM | POA: Diagnosis not present

## 2015-08-21 DIAGNOSIS — M4806 Spinal stenosis, lumbar region: Secondary | ICD-10-CM | POA: Diagnosis not present

## 2015-08-21 DIAGNOSIS — E538 Deficiency of other specified B group vitamins: Secondary | ICD-10-CM | POA: Diagnosis not present

## 2015-08-21 DIAGNOSIS — R269 Unspecified abnormalities of gait and mobility: Secondary | ICD-10-CM | POA: Diagnosis not present

## 2015-08-22 DIAGNOSIS — M6281 Muscle weakness (generalized): Secondary | ICD-10-CM | POA: Diagnosis not present

## 2015-08-22 DIAGNOSIS — M25561 Pain in right knee: Secondary | ICD-10-CM | POA: Diagnosis not present

## 2015-08-22 DIAGNOSIS — M25562 Pain in left knee: Secondary | ICD-10-CM | POA: Diagnosis not present

## 2015-08-22 DIAGNOSIS — M7062 Trochanteric bursitis, left hip: Secondary | ICD-10-CM | POA: Diagnosis not present

## 2015-08-22 DIAGNOSIS — M25552 Pain in left hip: Secondary | ICD-10-CM | POA: Diagnosis not present

## 2015-08-22 DIAGNOSIS — Z96642 Presence of left artificial hip joint: Secondary | ICD-10-CM | POA: Diagnosis not present

## 2015-08-24 DIAGNOSIS — M25662 Stiffness of left knee, not elsewhere classified: Secondary | ICD-10-CM | POA: Diagnosis not present

## 2015-08-24 DIAGNOSIS — Z6824 Body mass index (BMI) 24.0-24.9, adult: Secondary | ICD-10-CM | POA: Diagnosis not present

## 2015-08-24 DIAGNOSIS — M25562 Pain in left knee: Secondary | ICD-10-CM | POA: Diagnosis not present

## 2015-08-24 DIAGNOSIS — M25651 Stiffness of right hip, not elsewhere classified: Secondary | ICD-10-CM | POA: Diagnosis not present

## 2015-08-24 DIAGNOSIS — K219 Gastro-esophageal reflux disease without esophagitis: Secondary | ICD-10-CM | POA: Diagnosis not present

## 2015-08-24 DIAGNOSIS — Z96642 Presence of left artificial hip joint: Secondary | ICD-10-CM | POA: Diagnosis not present

## 2015-08-24 DIAGNOSIS — M25652 Stiffness of left hip, not elsewhere classified: Secondary | ICD-10-CM | POA: Diagnosis not present

## 2015-08-24 DIAGNOSIS — M6281 Muscle weakness (generalized): Secondary | ICD-10-CM | POA: Diagnosis not present

## 2015-08-24 DIAGNOSIS — G609 Hereditary and idiopathic neuropathy, unspecified: Secondary | ICD-10-CM | POA: Diagnosis not present

## 2015-08-24 DIAGNOSIS — M810 Age-related osteoporosis without current pathological fracture: Secondary | ICD-10-CM | POA: Diagnosis not present

## 2015-08-24 DIAGNOSIS — T148 Other injury of unspecified body region: Secondary | ICD-10-CM | POA: Diagnosis not present

## 2015-08-24 DIAGNOSIS — R609 Edema, unspecified: Secondary | ICD-10-CM | POA: Diagnosis not present

## 2015-08-24 DIAGNOSIS — M7062 Trochanteric bursitis, left hip: Secondary | ICD-10-CM | POA: Diagnosis not present

## 2015-08-24 DIAGNOSIS — G4700 Insomnia, unspecified: Secondary | ICD-10-CM | POA: Diagnosis not present

## 2015-08-24 DIAGNOSIS — F411 Generalized anxiety disorder: Secondary | ICD-10-CM | POA: Diagnosis not present

## 2015-08-24 DIAGNOSIS — M25661 Stiffness of right knee, not elsewhere classified: Secondary | ICD-10-CM | POA: Diagnosis not present

## 2015-08-24 DIAGNOSIS — M159 Polyosteoarthritis, unspecified: Secondary | ICD-10-CM | POA: Diagnosis not present

## 2015-08-24 DIAGNOSIS — R2689 Other abnormalities of gait and mobility: Secondary | ICD-10-CM | POA: Diagnosis not present

## 2015-08-24 DIAGNOSIS — F329 Major depressive disorder, single episode, unspecified: Secondary | ICD-10-CM | POA: Diagnosis not present

## 2015-08-24 DIAGNOSIS — M25561 Pain in right knee: Secondary | ICD-10-CM | POA: Diagnosis not present

## 2015-08-29 DIAGNOSIS — Z96642 Presence of left artificial hip joint: Secondary | ICD-10-CM | POA: Diagnosis not present

## 2015-08-29 DIAGNOSIS — M25561 Pain in right knee: Secondary | ICD-10-CM | POA: Diagnosis not present

## 2015-08-29 DIAGNOSIS — M6281 Muscle weakness (generalized): Secondary | ICD-10-CM | POA: Diagnosis not present

## 2015-08-29 DIAGNOSIS — M7062 Trochanteric bursitis, left hip: Secondary | ICD-10-CM | POA: Diagnosis not present

## 2015-08-29 DIAGNOSIS — M25562 Pain in left knee: Secondary | ICD-10-CM | POA: Diagnosis not present

## 2015-08-29 DIAGNOSIS — M25661 Stiffness of right knee, not elsewhere classified: Secondary | ICD-10-CM | POA: Diagnosis not present

## 2015-08-31 DIAGNOSIS — M25562 Pain in left knee: Secondary | ICD-10-CM | POA: Diagnosis not present

## 2015-08-31 DIAGNOSIS — M7062 Trochanteric bursitis, left hip: Secondary | ICD-10-CM | POA: Diagnosis not present

## 2015-08-31 DIAGNOSIS — M25561 Pain in right knee: Secondary | ICD-10-CM | POA: Diagnosis not present

## 2015-08-31 DIAGNOSIS — M25661 Stiffness of right knee, not elsewhere classified: Secondary | ICD-10-CM | POA: Diagnosis not present

## 2015-08-31 DIAGNOSIS — Z96642 Presence of left artificial hip joint: Secondary | ICD-10-CM | POA: Diagnosis not present

## 2015-08-31 DIAGNOSIS — M6281 Muscle weakness (generalized): Secondary | ICD-10-CM | POA: Diagnosis not present

## 2015-09-05 DIAGNOSIS — Z96642 Presence of left artificial hip joint: Secondary | ICD-10-CM | POA: Diagnosis not present

## 2015-09-05 DIAGNOSIS — M6281 Muscle weakness (generalized): Secondary | ICD-10-CM | POA: Diagnosis not present

## 2015-09-05 DIAGNOSIS — M25661 Stiffness of right knee, not elsewhere classified: Secondary | ICD-10-CM | POA: Diagnosis not present

## 2015-09-05 DIAGNOSIS — M25562 Pain in left knee: Secondary | ICD-10-CM | POA: Diagnosis not present

## 2015-09-05 DIAGNOSIS — M7062 Trochanteric bursitis, left hip: Secondary | ICD-10-CM | POA: Diagnosis not present

## 2015-09-05 DIAGNOSIS — M25561 Pain in right knee: Secondary | ICD-10-CM | POA: Diagnosis not present

## 2015-09-07 DIAGNOSIS — M7062 Trochanteric bursitis, left hip: Secondary | ICD-10-CM | POA: Diagnosis not present

## 2015-09-07 DIAGNOSIS — Z96642 Presence of left artificial hip joint: Secondary | ICD-10-CM | POA: Diagnosis not present

## 2015-09-07 DIAGNOSIS — M25561 Pain in right knee: Secondary | ICD-10-CM | POA: Diagnosis not present

## 2015-09-07 DIAGNOSIS — M25562 Pain in left knee: Secondary | ICD-10-CM | POA: Diagnosis not present

## 2015-09-07 DIAGNOSIS — M25661 Stiffness of right knee, not elsewhere classified: Secondary | ICD-10-CM | POA: Diagnosis not present

## 2015-09-07 DIAGNOSIS — M6281 Muscle weakness (generalized): Secondary | ICD-10-CM | POA: Diagnosis not present

## 2015-09-11 DIAGNOSIS — M6281 Muscle weakness (generalized): Secondary | ICD-10-CM | POA: Diagnosis not present

## 2015-09-11 DIAGNOSIS — M25562 Pain in left knee: Secondary | ICD-10-CM | POA: Diagnosis not present

## 2015-09-11 DIAGNOSIS — M25661 Stiffness of right knee, not elsewhere classified: Secondary | ICD-10-CM | POA: Diagnosis not present

## 2015-09-11 DIAGNOSIS — M25561 Pain in right knee: Secondary | ICD-10-CM | POA: Diagnosis not present

## 2015-09-11 DIAGNOSIS — M7062 Trochanteric bursitis, left hip: Secondary | ICD-10-CM | POA: Diagnosis not present

## 2015-09-11 DIAGNOSIS — Z96642 Presence of left artificial hip joint: Secondary | ICD-10-CM | POA: Diagnosis not present

## 2015-09-12 DIAGNOSIS — I872 Venous insufficiency (chronic) (peripheral): Secondary | ICD-10-CM | POA: Diagnosis not present

## 2015-09-12 DIAGNOSIS — I87311 Chronic venous hypertension (idiopathic) with ulcer of right lower extremity: Secondary | ICD-10-CM | POA: Diagnosis not present

## 2015-09-12 DIAGNOSIS — M1990 Unspecified osteoarthritis, unspecified site: Secondary | ICD-10-CM | POA: Diagnosis not present

## 2015-09-12 DIAGNOSIS — L97312 Non-pressure chronic ulcer of right ankle with fat layer exposed: Secondary | ICD-10-CM | POA: Diagnosis not present

## 2015-09-12 DIAGNOSIS — Z87891 Personal history of nicotine dependence: Secondary | ICD-10-CM | POA: Diagnosis not present

## 2015-09-12 DIAGNOSIS — G629 Polyneuropathy, unspecified: Secondary | ICD-10-CM | POA: Diagnosis not present

## 2015-09-12 DIAGNOSIS — L97311 Non-pressure chronic ulcer of right ankle limited to breakdown of skin: Secondary | ICD-10-CM | POA: Diagnosis not present

## 2015-09-14 DIAGNOSIS — M25661 Stiffness of right knee, not elsewhere classified: Secondary | ICD-10-CM | POA: Diagnosis not present

## 2015-09-14 DIAGNOSIS — M7062 Trochanteric bursitis, left hip: Secondary | ICD-10-CM | POA: Diagnosis not present

## 2015-09-14 DIAGNOSIS — M6281 Muscle weakness (generalized): Secondary | ICD-10-CM | POA: Diagnosis not present

## 2015-09-14 DIAGNOSIS — M25562 Pain in left knee: Secondary | ICD-10-CM | POA: Diagnosis not present

## 2015-09-14 DIAGNOSIS — M25561 Pain in right knee: Secondary | ICD-10-CM | POA: Diagnosis not present

## 2015-09-14 DIAGNOSIS — Z96642 Presence of left artificial hip joint: Secondary | ICD-10-CM | POA: Diagnosis not present

## 2015-09-18 DIAGNOSIS — M25561 Pain in right knee: Secondary | ICD-10-CM | POA: Diagnosis not present

## 2015-09-18 DIAGNOSIS — M7062 Trochanteric bursitis, left hip: Secondary | ICD-10-CM | POA: Diagnosis not present

## 2015-09-18 DIAGNOSIS — M25661 Stiffness of right knee, not elsewhere classified: Secondary | ICD-10-CM | POA: Diagnosis not present

## 2015-09-18 DIAGNOSIS — M6281 Muscle weakness (generalized): Secondary | ICD-10-CM | POA: Diagnosis not present

## 2015-09-18 DIAGNOSIS — M25562 Pain in left knee: Secondary | ICD-10-CM | POA: Diagnosis not present

## 2015-09-18 DIAGNOSIS — Z96642 Presence of left artificial hip joint: Secondary | ICD-10-CM | POA: Diagnosis not present

## 2015-09-19 DIAGNOSIS — I87312 Chronic venous hypertension (idiopathic) with ulcer of left lower extremity: Secondary | ICD-10-CM | POA: Diagnosis not present

## 2015-09-19 DIAGNOSIS — I872 Venous insufficiency (chronic) (peripheral): Secondary | ICD-10-CM | POA: Diagnosis not present

## 2015-09-19 DIAGNOSIS — L97311 Non-pressure chronic ulcer of right ankle limited to breakdown of skin: Secondary | ICD-10-CM | POA: Diagnosis not present

## 2015-09-20 DIAGNOSIS — M25562 Pain in left knee: Secondary | ICD-10-CM | POA: Diagnosis not present

## 2015-09-20 DIAGNOSIS — M25661 Stiffness of right knee, not elsewhere classified: Secondary | ICD-10-CM | POA: Diagnosis not present

## 2015-09-20 DIAGNOSIS — Z96642 Presence of left artificial hip joint: Secondary | ICD-10-CM | POA: Diagnosis not present

## 2015-09-20 DIAGNOSIS — M7062 Trochanteric bursitis, left hip: Secondary | ICD-10-CM | POA: Diagnosis not present

## 2015-09-20 DIAGNOSIS — M6281 Muscle weakness (generalized): Secondary | ICD-10-CM | POA: Diagnosis not present

## 2015-09-20 DIAGNOSIS — M25561 Pain in right knee: Secondary | ICD-10-CM | POA: Diagnosis not present

## 2015-09-21 DIAGNOSIS — F329 Major depressive disorder, single episode, unspecified: Secondary | ICD-10-CM | POA: Diagnosis not present

## 2015-09-21 DIAGNOSIS — Z6821 Body mass index (BMI) 21.0-21.9, adult: Secondary | ICD-10-CM | POA: Diagnosis not present

## 2015-09-21 DIAGNOSIS — R609 Edema, unspecified: Secondary | ICD-10-CM | POA: Diagnosis not present

## 2015-09-21 DIAGNOSIS — F411 Generalized anxiety disorder: Secondary | ICD-10-CM | POA: Diagnosis not present

## 2015-09-21 DIAGNOSIS — G609 Hereditary and idiopathic neuropathy, unspecified: Secondary | ICD-10-CM | POA: Diagnosis not present

## 2015-09-21 DIAGNOSIS — G4700 Insomnia, unspecified: Secondary | ICD-10-CM | POA: Diagnosis not present

## 2015-09-21 DIAGNOSIS — K219 Gastro-esophageal reflux disease without esophagitis: Secondary | ICD-10-CM | POA: Diagnosis not present

## 2015-09-21 DIAGNOSIS — M159 Polyosteoarthritis, unspecified: Secondary | ICD-10-CM | POA: Diagnosis not present

## 2015-09-21 DIAGNOSIS — M810 Age-related osteoporosis without current pathological fracture: Secondary | ICD-10-CM | POA: Diagnosis not present

## 2015-09-27 DIAGNOSIS — M25652 Stiffness of left hip, not elsewhere classified: Secondary | ICD-10-CM | POA: Diagnosis not present

## 2015-09-27 DIAGNOSIS — M25552 Pain in left hip: Secondary | ICD-10-CM | POA: Diagnosis not present

## 2015-09-27 DIAGNOSIS — M7062 Trochanteric bursitis, left hip: Secondary | ICD-10-CM | POA: Diagnosis not present

## 2015-09-27 DIAGNOSIS — Z96649 Presence of unspecified artificial hip joint: Secondary | ICD-10-CM | POA: Diagnosis not present

## 2015-09-27 DIAGNOSIS — R2689 Other abnormalities of gait and mobility: Secondary | ICD-10-CM | POA: Diagnosis not present

## 2015-09-27 DIAGNOSIS — L97311 Non-pressure chronic ulcer of right ankle limited to breakdown of skin: Secondary | ICD-10-CM | POA: Diagnosis not present

## 2015-09-27 DIAGNOSIS — M25561 Pain in right knee: Secondary | ICD-10-CM | POA: Diagnosis not present

## 2015-09-27 DIAGNOSIS — M25661 Stiffness of right knee, not elsewhere classified: Secondary | ICD-10-CM | POA: Diagnosis not present

## 2015-09-27 DIAGNOSIS — I872 Venous insufficiency (chronic) (peripheral): Secondary | ICD-10-CM | POA: Diagnosis not present

## 2015-09-27 DIAGNOSIS — I87311 Chronic venous hypertension (idiopathic) with ulcer of right lower extremity: Secondary | ICD-10-CM | POA: Diagnosis not present

## 2015-09-27 DIAGNOSIS — M6281 Muscle weakness (generalized): Secondary | ICD-10-CM | POA: Diagnosis not present

## 2015-10-04 DIAGNOSIS — I87311 Chronic venous hypertension (idiopathic) with ulcer of right lower extremity: Secondary | ICD-10-CM | POA: Diagnosis not present

## 2015-10-04 DIAGNOSIS — L97311 Non-pressure chronic ulcer of right ankle limited to breakdown of skin: Secondary | ICD-10-CM | POA: Diagnosis not present

## 2015-10-04 DIAGNOSIS — I872 Venous insufficiency (chronic) (peripheral): Secondary | ICD-10-CM | POA: Diagnosis not present

## 2015-10-11 DIAGNOSIS — L97311 Non-pressure chronic ulcer of right ankle limited to breakdown of skin: Secondary | ICD-10-CM | POA: Diagnosis not present

## 2015-10-11 DIAGNOSIS — I87311 Chronic venous hypertension (idiopathic) with ulcer of right lower extremity: Secondary | ICD-10-CM | POA: Diagnosis not present

## 2015-10-11 DIAGNOSIS — I872 Venous insufficiency (chronic) (peripheral): Secondary | ICD-10-CM | POA: Diagnosis not present

## 2015-10-12 DIAGNOSIS — Z96649 Presence of unspecified artificial hip joint: Secondary | ICD-10-CM | POA: Diagnosis not present

## 2015-10-18 DIAGNOSIS — I87311 Chronic venous hypertension (idiopathic) with ulcer of right lower extremity: Secondary | ICD-10-CM | POA: Diagnosis not present

## 2015-10-18 DIAGNOSIS — I872 Venous insufficiency (chronic) (peripheral): Secondary | ICD-10-CM | POA: Diagnosis not present

## 2015-10-18 DIAGNOSIS — L97311 Non-pressure chronic ulcer of right ankle limited to breakdown of skin: Secondary | ICD-10-CM | POA: Diagnosis not present

## 2015-10-19 DIAGNOSIS — F329 Major depressive disorder, single episode, unspecified: Secondary | ICD-10-CM | POA: Diagnosis not present

## 2015-10-19 DIAGNOSIS — G609 Hereditary and idiopathic neuropathy, unspecified: Secondary | ICD-10-CM | POA: Diagnosis not present

## 2015-10-19 DIAGNOSIS — K219 Gastro-esophageal reflux disease without esophagitis: Secondary | ICD-10-CM | POA: Diagnosis not present

## 2015-10-19 DIAGNOSIS — F411 Generalized anxiety disorder: Secondary | ICD-10-CM | POA: Diagnosis not present

## 2015-10-19 DIAGNOSIS — M7122 Synovial cyst of popliteal space [Baker], left knee: Secondary | ICD-10-CM | POA: Diagnosis not present

## 2015-10-19 DIAGNOSIS — M7989 Other specified soft tissue disorders: Secondary | ICD-10-CM | POA: Diagnosis not present

## 2015-10-19 DIAGNOSIS — R609 Edema, unspecified: Secondary | ICD-10-CM | POA: Diagnosis not present

## 2015-10-19 DIAGNOSIS — G4700 Insomnia, unspecified: Secondary | ICD-10-CM | POA: Diagnosis not present

## 2015-10-19 DIAGNOSIS — Z6821 Body mass index (BMI) 21.0-21.9, adult: Secondary | ICD-10-CM | POA: Diagnosis not present

## 2015-10-19 DIAGNOSIS — M810 Age-related osteoporosis without current pathological fracture: Secondary | ICD-10-CM | POA: Diagnosis not present

## 2015-10-19 DIAGNOSIS — M159 Polyosteoarthritis, unspecified: Secondary | ICD-10-CM | POA: Diagnosis not present

## 2015-10-19 DIAGNOSIS — I872 Venous insufficiency (chronic) (peripheral): Secondary | ICD-10-CM | POA: Diagnosis not present

## 2015-10-23 DIAGNOSIS — Z79891 Long term (current) use of opiate analgesic: Secondary | ICD-10-CM | POA: Diagnosis not present

## 2015-10-23 DIAGNOSIS — G609 Hereditary and idiopathic neuropathy, unspecified: Secondary | ICD-10-CM | POA: Diagnosis not present

## 2015-10-23 DIAGNOSIS — K219 Gastro-esophageal reflux disease without esophagitis: Secondary | ICD-10-CM | POA: Diagnosis not present

## 2015-10-23 DIAGNOSIS — F411 Generalized anxiety disorder: Secondary | ICD-10-CM | POA: Diagnosis not present

## 2015-10-23 DIAGNOSIS — Z6823 Body mass index (BMI) 23.0-23.9, adult: Secondary | ICD-10-CM | POA: Diagnosis not present

## 2015-10-23 DIAGNOSIS — F329 Major depressive disorder, single episode, unspecified: Secondary | ICD-10-CM | POA: Diagnosis not present

## 2015-10-23 DIAGNOSIS — Z79899 Other long term (current) drug therapy: Secondary | ICD-10-CM | POA: Diagnosis not present

## 2015-10-23 DIAGNOSIS — M159 Polyosteoarthritis, unspecified: Secondary | ICD-10-CM | POA: Diagnosis not present

## 2015-10-23 DIAGNOSIS — M810 Age-related osteoporosis without current pathological fracture: Secondary | ICD-10-CM | POA: Diagnosis not present

## 2015-10-23 DIAGNOSIS — R609 Edema, unspecified: Secondary | ICD-10-CM | POA: Diagnosis not present

## 2015-10-23 DIAGNOSIS — G4700 Insomnia, unspecified: Secondary | ICD-10-CM | POA: Diagnosis not present

## 2015-10-24 DIAGNOSIS — L97311 Non-pressure chronic ulcer of right ankle limited to breakdown of skin: Secondary | ICD-10-CM | POA: Diagnosis not present

## 2015-10-24 DIAGNOSIS — I872 Venous insufficiency (chronic) (peripheral): Secondary | ICD-10-CM | POA: Diagnosis not present

## 2015-10-24 DIAGNOSIS — I87311 Chronic venous hypertension (idiopathic) with ulcer of right lower extremity: Secondary | ICD-10-CM | POA: Diagnosis not present

## 2015-10-30 DIAGNOSIS — Z79891 Long term (current) use of opiate analgesic: Secondary | ICD-10-CM | POA: Diagnosis not present

## 2015-10-30 DIAGNOSIS — M961 Postlaminectomy syndrome, not elsewhere classified: Secondary | ICD-10-CM | POA: Diagnosis not present

## 2015-10-30 DIAGNOSIS — G894 Chronic pain syndrome: Secondary | ICD-10-CM | POA: Diagnosis not present

## 2015-10-31 DIAGNOSIS — R609 Edema, unspecified: Secondary | ICD-10-CM | POA: Diagnosis not present

## 2015-10-31 DIAGNOSIS — G4700 Insomnia, unspecified: Secondary | ICD-10-CM | POA: Diagnosis not present

## 2015-10-31 DIAGNOSIS — G609 Hereditary and idiopathic neuropathy, unspecified: Secondary | ICD-10-CM | POA: Diagnosis not present

## 2015-10-31 DIAGNOSIS — K219 Gastro-esophageal reflux disease without esophagitis: Secondary | ICD-10-CM | POA: Diagnosis not present

## 2015-10-31 DIAGNOSIS — I872 Venous insufficiency (chronic) (peripheral): Secondary | ICD-10-CM | POA: Diagnosis not present

## 2015-10-31 DIAGNOSIS — Z6823 Body mass index (BMI) 23.0-23.9, adult: Secondary | ICD-10-CM | POA: Diagnosis not present

## 2015-10-31 DIAGNOSIS — F411 Generalized anxiety disorder: Secondary | ICD-10-CM | POA: Diagnosis not present

## 2015-10-31 DIAGNOSIS — L97311 Non-pressure chronic ulcer of right ankle limited to breakdown of skin: Secondary | ICD-10-CM | POA: Diagnosis not present

## 2015-10-31 DIAGNOSIS — M810 Age-related osteoporosis without current pathological fracture: Secondary | ICD-10-CM | POA: Diagnosis not present

## 2015-10-31 DIAGNOSIS — F329 Major depressive disorder, single episode, unspecified: Secondary | ICD-10-CM | POA: Diagnosis not present

## 2015-10-31 DIAGNOSIS — I87311 Chronic venous hypertension (idiopathic) with ulcer of right lower extremity: Secondary | ICD-10-CM | POA: Diagnosis not present

## 2015-10-31 DIAGNOSIS — M159 Polyosteoarthritis, unspecified: Secondary | ICD-10-CM | POA: Diagnosis not present

## 2015-11-06 DIAGNOSIS — F329 Major depressive disorder, single episode, unspecified: Secondary | ICD-10-CM | POA: Diagnosis not present

## 2015-11-06 DIAGNOSIS — G609 Hereditary and idiopathic neuropathy, unspecified: Secondary | ICD-10-CM | POA: Diagnosis not present

## 2015-11-06 DIAGNOSIS — Z6823 Body mass index (BMI) 23.0-23.9, adult: Secondary | ICD-10-CM | POA: Diagnosis not present

## 2015-11-06 DIAGNOSIS — M810 Age-related osteoporosis without current pathological fracture: Secondary | ICD-10-CM | POA: Diagnosis not present

## 2015-11-06 DIAGNOSIS — G4700 Insomnia, unspecified: Secondary | ICD-10-CM | POA: Diagnosis not present

## 2015-11-06 DIAGNOSIS — K219 Gastro-esophageal reflux disease without esophagitis: Secondary | ICD-10-CM | POA: Diagnosis not present

## 2015-11-06 DIAGNOSIS — M159 Polyosteoarthritis, unspecified: Secondary | ICD-10-CM | POA: Diagnosis not present

## 2015-11-06 DIAGNOSIS — F411 Generalized anxiety disorder: Secondary | ICD-10-CM | POA: Diagnosis not present

## 2015-11-06 DIAGNOSIS — R609 Edema, unspecified: Secondary | ICD-10-CM | POA: Diagnosis not present

## 2015-11-07 DIAGNOSIS — I872 Venous insufficiency (chronic) (peripheral): Secondary | ICD-10-CM | POA: Diagnosis not present

## 2015-11-07 DIAGNOSIS — L97311 Non-pressure chronic ulcer of right ankle limited to breakdown of skin: Secondary | ICD-10-CM | POA: Diagnosis not present

## 2015-11-07 DIAGNOSIS — L97312 Non-pressure chronic ulcer of right ankle with fat layer exposed: Secondary | ICD-10-CM | POA: Diagnosis not present

## 2015-11-09 DIAGNOSIS — L97311 Non-pressure chronic ulcer of right ankle limited to breakdown of skin: Secondary | ICD-10-CM | POA: Diagnosis not present

## 2015-11-09 DIAGNOSIS — I872 Venous insufficiency (chronic) (peripheral): Secondary | ICD-10-CM | POA: Diagnosis not present

## 2015-11-14 DIAGNOSIS — I87311 Chronic venous hypertension (idiopathic) with ulcer of right lower extremity: Secondary | ICD-10-CM | POA: Diagnosis not present

## 2015-11-14 DIAGNOSIS — L97311 Non-pressure chronic ulcer of right ankle limited to breakdown of skin: Secondary | ICD-10-CM | POA: Diagnosis not present

## 2015-11-14 DIAGNOSIS — I872 Venous insufficiency (chronic) (peripheral): Secondary | ICD-10-CM | POA: Diagnosis not present

## 2015-11-16 DIAGNOSIS — I872 Venous insufficiency (chronic) (peripheral): Secondary | ICD-10-CM | POA: Diagnosis not present

## 2015-11-16 DIAGNOSIS — L97311 Non-pressure chronic ulcer of right ankle limited to breakdown of skin: Secondary | ICD-10-CM | POA: Diagnosis not present

## 2015-11-20 DIAGNOSIS — R609 Edema, unspecified: Secondary | ICD-10-CM | POA: Diagnosis not present

## 2015-11-20 DIAGNOSIS — G609 Hereditary and idiopathic neuropathy, unspecified: Secondary | ICD-10-CM | POA: Diagnosis not present

## 2015-11-20 DIAGNOSIS — I87311 Chronic venous hypertension (idiopathic) with ulcer of right lower extremity: Secondary | ICD-10-CM | POA: Diagnosis not present

## 2015-11-20 DIAGNOSIS — F411 Generalized anxiety disorder: Secondary | ICD-10-CM | POA: Diagnosis not present

## 2015-11-20 DIAGNOSIS — L97311 Non-pressure chronic ulcer of right ankle limited to breakdown of skin: Secondary | ICD-10-CM | POA: Diagnosis not present

## 2015-11-20 DIAGNOSIS — M159 Polyosteoarthritis, unspecified: Secondary | ICD-10-CM | POA: Diagnosis not present

## 2015-11-20 DIAGNOSIS — G4700 Insomnia, unspecified: Secondary | ICD-10-CM | POA: Diagnosis not present

## 2015-11-20 DIAGNOSIS — F329 Major depressive disorder, single episode, unspecified: Secondary | ICD-10-CM | POA: Diagnosis not present

## 2015-11-20 DIAGNOSIS — K219 Gastro-esophageal reflux disease without esophagitis: Secondary | ICD-10-CM | POA: Diagnosis not present

## 2015-11-20 DIAGNOSIS — I872 Venous insufficiency (chronic) (peripheral): Secondary | ICD-10-CM | POA: Diagnosis not present

## 2015-11-20 DIAGNOSIS — Z6823 Body mass index (BMI) 23.0-23.9, adult: Secondary | ICD-10-CM | POA: Diagnosis not present

## 2015-11-20 DIAGNOSIS — M810 Age-related osteoporosis without current pathological fracture: Secondary | ICD-10-CM | POA: Diagnosis not present

## 2015-11-23 DIAGNOSIS — S72002D Fracture of unspecified part of neck of left femur, subsequent encounter for closed fracture with routine healing: Secondary | ICD-10-CM | POA: Diagnosis not present

## 2015-11-27 DIAGNOSIS — I872 Venous insufficiency (chronic) (peripheral): Secondary | ICD-10-CM | POA: Diagnosis not present

## 2015-11-27 DIAGNOSIS — L97312 Non-pressure chronic ulcer of right ankle with fat layer exposed: Secondary | ICD-10-CM | POA: Diagnosis not present

## 2015-11-27 DIAGNOSIS — L97311 Non-pressure chronic ulcer of right ankle limited to breakdown of skin: Secondary | ICD-10-CM | POA: Diagnosis not present

## 2015-11-27 DIAGNOSIS — I87331 Chronic venous hypertension (idiopathic) with ulcer and inflammation of right lower extremity: Secondary | ICD-10-CM | POA: Diagnosis not present

## 2015-11-30 DIAGNOSIS — I872 Venous insufficiency (chronic) (peripheral): Secondary | ICD-10-CM | POA: Diagnosis not present

## 2015-11-30 DIAGNOSIS — L97311 Non-pressure chronic ulcer of right ankle limited to breakdown of skin: Secondary | ICD-10-CM | POA: Diagnosis not present

## 2015-12-04 DIAGNOSIS — L97312 Non-pressure chronic ulcer of right ankle with fat layer exposed: Secondary | ICD-10-CM | POA: Diagnosis not present

## 2015-12-04 DIAGNOSIS — S72002D Fracture of unspecified part of neck of left femur, subsequent encounter for closed fracture with routine healing: Secondary | ICD-10-CM | POA: Diagnosis not present

## 2015-12-04 DIAGNOSIS — I872 Venous insufficiency (chronic) (peripheral): Secondary | ICD-10-CM | POA: Diagnosis not present

## 2015-12-04 DIAGNOSIS — Z23 Encounter for immunization: Secondary | ICD-10-CM | POA: Diagnosis not present

## 2015-12-04 DIAGNOSIS — Z96649 Presence of unspecified artificial hip joint: Secondary | ICD-10-CM | POA: Diagnosis not present

## 2015-12-06 DIAGNOSIS — R2689 Other abnormalities of gait and mobility: Secondary | ICD-10-CM | POA: Diagnosis not present

## 2015-12-06 DIAGNOSIS — M818 Other osteoporosis without current pathological fracture: Secondary | ICD-10-CM | POA: Diagnosis not present

## 2015-12-06 DIAGNOSIS — Z9181 History of falling: Secondary | ICD-10-CM | POA: Diagnosis not present

## 2015-12-06 DIAGNOSIS — K219 Gastro-esophageal reflux disease without esophagitis: Secondary | ICD-10-CM | POA: Diagnosis not present

## 2015-12-07 DIAGNOSIS — I872 Venous insufficiency (chronic) (peripheral): Secondary | ICD-10-CM | POA: Diagnosis not present

## 2015-12-07 DIAGNOSIS — L97312 Non-pressure chronic ulcer of right ankle with fat layer exposed: Secondary | ICD-10-CM | POA: Diagnosis not present

## 2015-12-10 DIAGNOSIS — E041 Nontoxic single thyroid nodule: Secondary | ICD-10-CM | POA: Diagnosis not present

## 2015-12-10 DIAGNOSIS — Z119 Encounter for screening for infectious and parasitic diseases, unspecified: Secondary | ICD-10-CM | POA: Diagnosis not present

## 2015-12-11 DIAGNOSIS — L97312 Non-pressure chronic ulcer of right ankle with fat layer exposed: Secondary | ICD-10-CM | POA: Diagnosis not present

## 2015-12-11 DIAGNOSIS — I872 Venous insufficiency (chronic) (peripheral): Secondary | ICD-10-CM | POA: Diagnosis not present

## 2015-12-14 DIAGNOSIS — I872 Venous insufficiency (chronic) (peripheral): Secondary | ICD-10-CM | POA: Diagnosis not present

## 2015-12-14 DIAGNOSIS — L97312 Non-pressure chronic ulcer of right ankle with fat layer exposed: Secondary | ICD-10-CM | POA: Diagnosis not present

## 2015-12-18 DIAGNOSIS — I872 Venous insufficiency (chronic) (peripheral): Secondary | ICD-10-CM | POA: Diagnosis not present

## 2015-12-18 DIAGNOSIS — L97312 Non-pressure chronic ulcer of right ankle with fat layer exposed: Secondary | ICD-10-CM | POA: Diagnosis not present

## 2015-12-18 DIAGNOSIS — I87311 Chronic venous hypertension (idiopathic) with ulcer of right lower extremity: Secondary | ICD-10-CM | POA: Diagnosis not present

## 2015-12-19 DIAGNOSIS — R296 Repeated falls: Secondary | ICD-10-CM | POA: Diagnosis not present

## 2015-12-19 DIAGNOSIS — K7689 Other specified diseases of liver: Secondary | ICD-10-CM | POA: Diagnosis not present

## 2015-12-19 DIAGNOSIS — I700 Atherosclerosis of aorta: Secondary | ICD-10-CM | POA: Diagnosis not present

## 2015-12-19 DIAGNOSIS — E042 Nontoxic multinodular goiter: Secondary | ICD-10-CM | POA: Diagnosis not present

## 2015-12-19 DIAGNOSIS — E041 Nontoxic single thyroid nodule: Secondary | ICD-10-CM | POA: Diagnosis not present

## 2015-12-19 DIAGNOSIS — D3501 Benign neoplasm of right adrenal gland: Secondary | ICD-10-CM | POA: Diagnosis not present

## 2015-12-20 DIAGNOSIS — M810 Age-related osteoporosis without current pathological fracture: Secondary | ICD-10-CM | POA: Diagnosis not present

## 2015-12-20 DIAGNOSIS — G609 Hereditary and idiopathic neuropathy, unspecified: Secondary | ICD-10-CM | POA: Diagnosis not present

## 2015-12-20 DIAGNOSIS — F329 Major depressive disorder, single episode, unspecified: Secondary | ICD-10-CM | POA: Diagnosis not present

## 2015-12-20 DIAGNOSIS — Z6822 Body mass index (BMI) 22.0-22.9, adult: Secondary | ICD-10-CM | POA: Diagnosis not present

## 2015-12-20 DIAGNOSIS — M159 Polyosteoarthritis, unspecified: Secondary | ICD-10-CM | POA: Diagnosis not present

## 2015-12-20 DIAGNOSIS — G4700 Insomnia, unspecified: Secondary | ICD-10-CM | POA: Diagnosis not present

## 2015-12-20 DIAGNOSIS — R609 Edema, unspecified: Secondary | ICD-10-CM | POA: Diagnosis not present

## 2015-12-20 DIAGNOSIS — K219 Gastro-esophageal reflux disease without esophagitis: Secondary | ICD-10-CM | POA: Diagnosis not present

## 2015-12-20 DIAGNOSIS — F411 Generalized anxiety disorder: Secondary | ICD-10-CM | POA: Diagnosis not present

## 2015-12-21 DIAGNOSIS — I872 Venous insufficiency (chronic) (peripheral): Secondary | ICD-10-CM | POA: Diagnosis not present

## 2015-12-21 DIAGNOSIS — L97312 Non-pressure chronic ulcer of right ankle with fat layer exposed: Secondary | ICD-10-CM | POA: Diagnosis not present

## 2015-12-25 DIAGNOSIS — K219 Gastro-esophageal reflux disease without esophagitis: Secondary | ICD-10-CM | POA: Diagnosis not present

## 2015-12-25 DIAGNOSIS — Z9181 History of falling: Secondary | ICD-10-CM | POA: Diagnosis not present

## 2015-12-25 DIAGNOSIS — R2689 Other abnormalities of gait and mobility: Secondary | ICD-10-CM | POA: Diagnosis not present

## 2015-12-25 DIAGNOSIS — G4700 Insomnia, unspecified: Secondary | ICD-10-CM | POA: Diagnosis not present

## 2015-12-26 DIAGNOSIS — I87312 Chronic venous hypertension (idiopathic) with ulcer of left lower extremity: Secondary | ICD-10-CM | POA: Diagnosis not present

## 2015-12-26 DIAGNOSIS — M159 Polyosteoarthritis, unspecified: Secondary | ICD-10-CM | POA: Diagnosis not present

## 2015-12-26 DIAGNOSIS — G609 Hereditary and idiopathic neuropathy, unspecified: Secondary | ICD-10-CM | POA: Diagnosis not present

## 2015-12-26 DIAGNOSIS — L97312 Non-pressure chronic ulcer of right ankle with fat layer exposed: Secondary | ICD-10-CM | POA: Diagnosis not present

## 2015-12-26 DIAGNOSIS — F329 Major depressive disorder, single episode, unspecified: Secondary | ICD-10-CM | POA: Diagnosis not present

## 2015-12-26 DIAGNOSIS — I872 Venous insufficiency (chronic) (peripheral): Secondary | ICD-10-CM | POA: Diagnosis not present

## 2015-12-26 DIAGNOSIS — Z6822 Body mass index (BMI) 22.0-22.9, adult: Secondary | ICD-10-CM | POA: Diagnosis not present

## 2015-12-26 DIAGNOSIS — G4700 Insomnia, unspecified: Secondary | ICD-10-CM | POA: Diagnosis not present

## 2015-12-26 DIAGNOSIS — R609 Edema, unspecified: Secondary | ICD-10-CM | POA: Diagnosis not present

## 2015-12-26 DIAGNOSIS — M810 Age-related osteoporosis without current pathological fracture: Secondary | ICD-10-CM | POA: Diagnosis not present

## 2015-12-26 DIAGNOSIS — K219 Gastro-esophageal reflux disease without esophagitis: Secondary | ICD-10-CM | POA: Diagnosis not present

## 2015-12-26 DIAGNOSIS — F411 Generalized anxiety disorder: Secondary | ICD-10-CM | POA: Diagnosis not present

## 2016-01-01 DIAGNOSIS — I872 Venous insufficiency (chronic) (peripheral): Secondary | ICD-10-CM | POA: Diagnosis not present

## 2016-01-01 DIAGNOSIS — L97312 Non-pressure chronic ulcer of right ankle with fat layer exposed: Secondary | ICD-10-CM | POA: Diagnosis not present

## 2016-01-08 DIAGNOSIS — L97312 Non-pressure chronic ulcer of right ankle with fat layer exposed: Secondary | ICD-10-CM | POA: Diagnosis not present

## 2016-01-08 DIAGNOSIS — I872 Venous insufficiency (chronic) (peripheral): Secondary | ICD-10-CM | POA: Diagnosis not present

## 2016-01-08 DIAGNOSIS — I87311 Chronic venous hypertension (idiopathic) with ulcer of right lower extremity: Secondary | ICD-10-CM | POA: Diagnosis not present

## 2016-01-17 DIAGNOSIS — G4700 Insomnia, unspecified: Secondary | ICD-10-CM | POA: Diagnosis not present

## 2016-01-17 DIAGNOSIS — K589 Irritable bowel syndrome without diarrhea: Secondary | ICD-10-CM | POA: Diagnosis not present

## 2016-01-17 DIAGNOSIS — R609 Edema, unspecified: Secondary | ICD-10-CM | POA: Diagnosis not present

## 2016-01-17 DIAGNOSIS — F411 Generalized anxiety disorder: Secondary | ICD-10-CM | POA: Diagnosis not present

## 2016-01-17 DIAGNOSIS — M810 Age-related osteoporosis without current pathological fracture: Secondary | ICD-10-CM | POA: Diagnosis not present

## 2016-01-17 DIAGNOSIS — K219 Gastro-esophageal reflux disease without esophagitis: Secondary | ICD-10-CM | POA: Diagnosis not present

## 2016-01-17 DIAGNOSIS — G609 Hereditary and idiopathic neuropathy, unspecified: Secondary | ICD-10-CM | POA: Diagnosis not present

## 2016-01-17 DIAGNOSIS — Z6822 Body mass index (BMI) 22.0-22.9, adult: Secondary | ICD-10-CM | POA: Diagnosis not present

## 2016-01-17 DIAGNOSIS — F329 Major depressive disorder, single episode, unspecified: Secondary | ICD-10-CM | POA: Diagnosis not present

## 2016-01-17 DIAGNOSIS — M159 Polyosteoarthritis, unspecified: Secondary | ICD-10-CM | POA: Diagnosis not present

## 2016-01-18 DIAGNOSIS — L97311 Non-pressure chronic ulcer of right ankle limited to breakdown of skin: Secondary | ICD-10-CM | POA: Diagnosis not present

## 2016-01-18 DIAGNOSIS — I87311 Chronic venous hypertension (idiopathic) with ulcer of right lower extremity: Secondary | ICD-10-CM | POA: Diagnosis not present

## 2016-01-18 DIAGNOSIS — I872 Venous insufficiency (chronic) (peripheral): Secondary | ICD-10-CM | POA: Diagnosis not present

## 2016-01-18 DIAGNOSIS — L97312 Non-pressure chronic ulcer of right ankle with fat layer exposed: Secondary | ICD-10-CM | POA: Diagnosis not present

## 2016-01-22 DIAGNOSIS — I872 Venous insufficiency (chronic) (peripheral): Secondary | ICD-10-CM | POA: Diagnosis not present

## 2016-01-22 DIAGNOSIS — L97312 Non-pressure chronic ulcer of right ankle with fat layer exposed: Secondary | ICD-10-CM | POA: Diagnosis not present

## 2016-01-23 DIAGNOSIS — R609 Edema, unspecified: Secondary | ICD-10-CM | POA: Diagnosis not present

## 2016-01-23 DIAGNOSIS — K219 Gastro-esophageal reflux disease without esophagitis: Secondary | ICD-10-CM | POA: Diagnosis not present

## 2016-01-23 DIAGNOSIS — F411 Generalized anxiety disorder: Secondary | ICD-10-CM | POA: Diagnosis not present

## 2016-01-23 DIAGNOSIS — F329 Major depressive disorder, single episode, unspecified: Secondary | ICD-10-CM | POA: Diagnosis not present

## 2016-01-23 DIAGNOSIS — M159 Polyosteoarthritis, unspecified: Secondary | ICD-10-CM | POA: Diagnosis not present

## 2016-01-23 DIAGNOSIS — Z6822 Body mass index (BMI) 22.0-22.9, adult: Secondary | ICD-10-CM | POA: Diagnosis not present

## 2016-01-23 DIAGNOSIS — M810 Age-related osteoporosis without current pathological fracture: Secondary | ICD-10-CM | POA: Diagnosis not present

## 2016-01-23 DIAGNOSIS — J309 Allergic rhinitis, unspecified: Secondary | ICD-10-CM | POA: Diagnosis not present

## 2016-01-23 DIAGNOSIS — K589 Irritable bowel syndrome without diarrhea: Secondary | ICD-10-CM | POA: Diagnosis not present

## 2016-01-23 DIAGNOSIS — G609 Hereditary and idiopathic neuropathy, unspecified: Secondary | ICD-10-CM | POA: Diagnosis not present

## 2016-01-23 DIAGNOSIS — G4700 Insomnia, unspecified: Secondary | ICD-10-CM | POA: Diagnosis not present

## 2016-01-25 DIAGNOSIS — I872 Venous insufficiency (chronic) (peripheral): Secondary | ICD-10-CM | POA: Diagnosis not present

## 2016-01-25 DIAGNOSIS — L97312 Non-pressure chronic ulcer of right ankle with fat layer exposed: Secondary | ICD-10-CM | POA: Diagnosis not present

## 2016-01-25 DIAGNOSIS — I87311 Chronic venous hypertension (idiopathic) with ulcer of right lower extremity: Secondary | ICD-10-CM | POA: Diagnosis not present

## 2016-01-29 DIAGNOSIS — L97312 Non-pressure chronic ulcer of right ankle with fat layer exposed: Secondary | ICD-10-CM | POA: Diagnosis not present

## 2016-01-29 DIAGNOSIS — I872 Venous insufficiency (chronic) (peripheral): Secondary | ICD-10-CM | POA: Diagnosis not present

## 2016-02-01 DIAGNOSIS — L97312 Non-pressure chronic ulcer of right ankle with fat layer exposed: Secondary | ICD-10-CM | POA: Diagnosis not present

## 2016-02-01 DIAGNOSIS — I87331 Chronic venous hypertension (idiopathic) with ulcer and inflammation of right lower extremity: Secondary | ICD-10-CM | POA: Diagnosis not present

## 2016-02-01 DIAGNOSIS — I872 Venous insufficiency (chronic) (peripheral): Secondary | ICD-10-CM | POA: Diagnosis not present

## 2016-02-05 DIAGNOSIS — L97312 Non-pressure chronic ulcer of right ankle with fat layer exposed: Secondary | ICD-10-CM | POA: Diagnosis not present

## 2016-02-05 DIAGNOSIS — I872 Venous insufficiency (chronic) (peripheral): Secondary | ICD-10-CM | POA: Diagnosis not present

## 2016-02-08 DIAGNOSIS — I87311 Chronic venous hypertension (idiopathic) with ulcer of right lower extremity: Secondary | ICD-10-CM | POA: Diagnosis not present

## 2016-02-08 DIAGNOSIS — L97311 Non-pressure chronic ulcer of right ankle limited to breakdown of skin: Secondary | ICD-10-CM | POA: Diagnosis not present

## 2016-02-08 DIAGNOSIS — L97312 Non-pressure chronic ulcer of right ankle with fat layer exposed: Secondary | ICD-10-CM | POA: Diagnosis not present

## 2016-02-08 DIAGNOSIS — I872 Venous insufficiency (chronic) (peripheral): Secondary | ICD-10-CM | POA: Diagnosis not present

## 2016-02-12 DIAGNOSIS — G609 Hereditary and idiopathic neuropathy, unspecified: Secondary | ICD-10-CM | POA: Diagnosis not present

## 2016-02-12 DIAGNOSIS — M25471 Effusion, right ankle: Secondary | ICD-10-CM | POA: Diagnosis not present

## 2016-02-12 DIAGNOSIS — L97312 Non-pressure chronic ulcer of right ankle with fat layer exposed: Secondary | ICD-10-CM | POA: Diagnosis not present

## 2016-02-12 DIAGNOSIS — M961 Postlaminectomy syndrome, not elsewhere classified: Secondary | ICD-10-CM | POA: Diagnosis not present

## 2016-02-12 DIAGNOSIS — Z9181 History of falling: Secondary | ICD-10-CM | POA: Diagnosis not present

## 2016-02-12 DIAGNOSIS — M48061 Spinal stenosis, lumbar region without neurogenic claudication: Secondary | ICD-10-CM | POA: Diagnosis not present

## 2016-02-12 DIAGNOSIS — R269 Unspecified abnormalities of gait and mobility: Secondary | ICD-10-CM | POA: Diagnosis not present

## 2016-02-12 DIAGNOSIS — E538 Deficiency of other specified B group vitamins: Secondary | ICD-10-CM | POA: Diagnosis not present

## 2016-02-12 DIAGNOSIS — L97319 Non-pressure chronic ulcer of right ankle with unspecified severity: Secondary | ICD-10-CM | POA: Diagnosis not present

## 2016-02-13 DIAGNOSIS — R609 Edema, unspecified: Secondary | ICD-10-CM | POA: Diagnosis not present

## 2016-02-13 DIAGNOSIS — J209 Acute bronchitis, unspecified: Secondary | ICD-10-CM | POA: Diagnosis not present

## 2016-02-13 DIAGNOSIS — F411 Generalized anxiety disorder: Secondary | ICD-10-CM | POA: Diagnosis not present

## 2016-02-13 DIAGNOSIS — K219 Gastro-esophageal reflux disease without esophagitis: Secondary | ICD-10-CM | POA: Diagnosis not present

## 2016-02-13 DIAGNOSIS — Z6823 Body mass index (BMI) 23.0-23.9, adult: Secondary | ICD-10-CM | POA: Diagnosis not present

## 2016-02-13 DIAGNOSIS — F329 Major depressive disorder, single episode, unspecified: Secondary | ICD-10-CM | POA: Diagnosis not present

## 2016-02-13 DIAGNOSIS — G4700 Insomnia, unspecified: Secondary | ICD-10-CM | POA: Diagnosis not present

## 2016-02-13 DIAGNOSIS — M810 Age-related osteoporosis without current pathological fracture: Secondary | ICD-10-CM | POA: Diagnosis not present

## 2016-02-13 DIAGNOSIS — G609 Hereditary and idiopathic neuropathy, unspecified: Secondary | ICD-10-CM | POA: Diagnosis not present

## 2016-02-13 DIAGNOSIS — M159 Polyosteoarthritis, unspecified: Secondary | ICD-10-CM | POA: Diagnosis not present

## 2016-02-15 DIAGNOSIS — I872 Venous insufficiency (chronic) (peripheral): Secondary | ICD-10-CM | POA: Diagnosis not present

## 2016-02-15 DIAGNOSIS — L97311 Non-pressure chronic ulcer of right ankle limited to breakdown of skin: Secondary | ICD-10-CM | POA: Diagnosis not present

## 2016-02-15 DIAGNOSIS — L97312 Non-pressure chronic ulcer of right ankle with fat layer exposed: Secondary | ICD-10-CM | POA: Diagnosis not present

## 2016-02-21 DIAGNOSIS — M1711 Unilateral primary osteoarthritis, right knee: Secondary | ICD-10-CM | POA: Diagnosis not present

## 2016-02-21 DIAGNOSIS — Z79891 Long term (current) use of opiate analgesic: Secondary | ICD-10-CM | POA: Diagnosis not present

## 2016-02-21 DIAGNOSIS — M961 Postlaminectomy syndrome, not elsewhere classified: Secondary | ICD-10-CM | POA: Diagnosis not present

## 2016-02-21 DIAGNOSIS — G894 Chronic pain syndrome: Secondary | ICD-10-CM | POA: Diagnosis not present

## 2016-02-22 DIAGNOSIS — L97312 Non-pressure chronic ulcer of right ankle with fat layer exposed: Secondary | ICD-10-CM | POA: Diagnosis not present

## 2016-02-22 DIAGNOSIS — I872 Venous insufficiency (chronic) (peripheral): Secondary | ICD-10-CM | POA: Diagnosis not present

## 2016-02-29 DIAGNOSIS — L97312 Non-pressure chronic ulcer of right ankle with fat layer exposed: Secondary | ICD-10-CM | POA: Diagnosis not present

## 2016-02-29 DIAGNOSIS — I872 Venous insufficiency (chronic) (peripheral): Secondary | ICD-10-CM | POA: Diagnosis not present

## 2016-03-05 DIAGNOSIS — Z8781 Personal history of (healed) traumatic fracture: Secondary | ICD-10-CM | POA: Diagnosis not present

## 2016-03-05 DIAGNOSIS — G8929 Other chronic pain: Secondary | ICD-10-CM | POA: Diagnosis not present

## 2016-03-05 DIAGNOSIS — M25552 Pain in left hip: Secondary | ICD-10-CM | POA: Diagnosis not present

## 2016-03-07 DIAGNOSIS — I87311 Chronic venous hypertension (idiopathic) with ulcer of right lower extremity: Secondary | ICD-10-CM | POA: Diagnosis not present

## 2016-03-07 DIAGNOSIS — L97312 Non-pressure chronic ulcer of right ankle with fat layer exposed: Secondary | ICD-10-CM | POA: Diagnosis not present

## 2016-03-07 DIAGNOSIS — I872 Venous insufficiency (chronic) (peripheral): Secondary | ICD-10-CM | POA: Diagnosis not present

## 2016-03-07 DIAGNOSIS — L97311 Non-pressure chronic ulcer of right ankle limited to breakdown of skin: Secondary | ICD-10-CM | POA: Diagnosis not present

## 2016-03-12 DIAGNOSIS — F329 Major depressive disorder, single episode, unspecified: Secondary | ICD-10-CM | POA: Diagnosis not present

## 2016-03-12 DIAGNOSIS — M159 Polyosteoarthritis, unspecified: Secondary | ICD-10-CM | POA: Diagnosis not present

## 2016-03-12 DIAGNOSIS — F411 Generalized anxiety disorder: Secondary | ICD-10-CM | POA: Diagnosis not present

## 2016-03-12 DIAGNOSIS — Z79899 Other long term (current) drug therapy: Secondary | ICD-10-CM | POA: Diagnosis not present

## 2016-03-12 DIAGNOSIS — Z6823 Body mass index (BMI) 23.0-23.9, adult: Secondary | ICD-10-CM | POA: Diagnosis not present

## 2016-03-12 DIAGNOSIS — G609 Hereditary and idiopathic neuropathy, unspecified: Secondary | ICD-10-CM | POA: Diagnosis not present

## 2016-03-12 DIAGNOSIS — J309 Allergic rhinitis, unspecified: Secondary | ICD-10-CM | POA: Diagnosis not present

## 2016-03-12 DIAGNOSIS — G4700 Insomnia, unspecified: Secondary | ICD-10-CM | POA: Diagnosis not present

## 2016-03-12 DIAGNOSIS — M810 Age-related osteoporosis without current pathological fracture: Secondary | ICD-10-CM | POA: Diagnosis not present

## 2016-03-12 DIAGNOSIS — K589 Irritable bowel syndrome without diarrhea: Secondary | ICD-10-CM | POA: Diagnosis not present

## 2016-03-12 DIAGNOSIS — K219 Gastro-esophageal reflux disease without esophagitis: Secondary | ICD-10-CM | POA: Diagnosis not present

## 2016-03-12 DIAGNOSIS — R609 Edema, unspecified: Secondary | ICD-10-CM | POA: Diagnosis not present

## 2016-03-14 DIAGNOSIS — L97312 Non-pressure chronic ulcer of right ankle with fat layer exposed: Secondary | ICD-10-CM | POA: Diagnosis not present

## 2016-03-14 DIAGNOSIS — L97311 Non-pressure chronic ulcer of right ankle limited to breakdown of skin: Secondary | ICD-10-CM | POA: Diagnosis not present

## 2016-03-14 DIAGNOSIS — I87311 Chronic venous hypertension (idiopathic) with ulcer of right lower extremity: Secondary | ICD-10-CM | POA: Diagnosis not present

## 2016-03-14 DIAGNOSIS — I872 Venous insufficiency (chronic) (peripheral): Secondary | ICD-10-CM | POA: Diagnosis not present

## 2016-03-19 DIAGNOSIS — Z21 Asymptomatic human immunodeficiency virus [HIV] infection status: Secondary | ICD-10-CM | POA: Diagnosis not present

## 2016-03-19 DIAGNOSIS — M25552 Pain in left hip: Secondary | ICD-10-CM | POA: Diagnosis not present

## 2016-03-19 DIAGNOSIS — G8929 Other chronic pain: Secondary | ICD-10-CM | POA: Diagnosis not present

## 2016-03-20 DIAGNOSIS — I87311 Chronic venous hypertension (idiopathic) with ulcer of right lower extremity: Secondary | ICD-10-CM | POA: Diagnosis not present

## 2016-03-20 DIAGNOSIS — I872 Venous insufficiency (chronic) (peripheral): Secondary | ICD-10-CM | POA: Diagnosis not present

## 2016-03-20 DIAGNOSIS — L97311 Non-pressure chronic ulcer of right ankle limited to breakdown of skin: Secondary | ICD-10-CM | POA: Diagnosis not present

## 2016-03-20 DIAGNOSIS — L97312 Non-pressure chronic ulcer of right ankle with fat layer exposed: Secondary | ICD-10-CM | POA: Diagnosis not present

## 2016-03-20 DIAGNOSIS — L97319 Non-pressure chronic ulcer of right ankle with unspecified severity: Secondary | ICD-10-CM | POA: Diagnosis not present

## 2016-04-03 DIAGNOSIS — I872 Venous insufficiency (chronic) (peripheral): Secondary | ICD-10-CM | POA: Diagnosis not present

## 2016-04-03 DIAGNOSIS — Z09 Encounter for follow-up examination after completed treatment for conditions other than malignant neoplasm: Secondary | ICD-10-CM | POA: Diagnosis not present

## 2016-04-03 DIAGNOSIS — Z872 Personal history of diseases of the skin and subcutaneous tissue: Secondary | ICD-10-CM | POA: Diagnosis not present

## 2016-04-15 DIAGNOSIS — K589 Irritable bowel syndrome without diarrhea: Secondary | ICD-10-CM | POA: Diagnosis not present

## 2016-04-15 DIAGNOSIS — Z6824 Body mass index (BMI) 24.0-24.9, adult: Secondary | ICD-10-CM | POA: Diagnosis not present

## 2016-04-15 DIAGNOSIS — F329 Major depressive disorder, single episode, unspecified: Secondary | ICD-10-CM | POA: Diagnosis not present

## 2016-04-15 DIAGNOSIS — M810 Age-related osteoporosis without current pathological fracture: Secondary | ICD-10-CM | POA: Diagnosis not present

## 2016-04-15 DIAGNOSIS — M159 Polyosteoarthritis, unspecified: Secondary | ICD-10-CM | POA: Diagnosis not present

## 2016-04-15 DIAGNOSIS — F411 Generalized anxiety disorder: Secondary | ICD-10-CM | POA: Diagnosis not present

## 2016-04-15 DIAGNOSIS — R609 Edema, unspecified: Secondary | ICD-10-CM | POA: Diagnosis not present

## 2016-04-15 DIAGNOSIS — G609 Hereditary and idiopathic neuropathy, unspecified: Secondary | ICD-10-CM | POA: Diagnosis not present

## 2016-04-15 DIAGNOSIS — J309 Allergic rhinitis, unspecified: Secondary | ICD-10-CM | POA: Diagnosis not present

## 2016-04-15 DIAGNOSIS — G4700 Insomnia, unspecified: Secondary | ICD-10-CM | POA: Diagnosis not present

## 2016-04-15 DIAGNOSIS — K219 Gastro-esophageal reflux disease without esophagitis: Secondary | ICD-10-CM | POA: Diagnosis not present

## 2016-04-15 DIAGNOSIS — Z1389 Encounter for screening for other disorder: Secondary | ICD-10-CM | POA: Diagnosis not present

## 2016-04-18 DIAGNOSIS — Z96651 Presence of right artificial knee joint: Secondary | ICD-10-CM | POA: Diagnosis not present

## 2016-04-18 DIAGNOSIS — Z471 Aftercare following joint replacement surgery: Secondary | ICD-10-CM | POA: Diagnosis not present

## 2016-04-24 DIAGNOSIS — M1612 Unilateral primary osteoarthritis, left hip: Secondary | ICD-10-CM | POA: Diagnosis not present

## 2016-05-08 DIAGNOSIS — G8929 Other chronic pain: Secondary | ICD-10-CM | POA: Diagnosis not present

## 2016-05-08 DIAGNOSIS — T84018D Broken internal joint prosthesis, other site, subsequent encounter: Secondary | ICD-10-CM | POA: Diagnosis not present

## 2016-05-08 DIAGNOSIS — M25552 Pain in left hip: Secondary | ICD-10-CM | POA: Diagnosis not present

## 2016-05-08 DIAGNOSIS — Z96642 Presence of left artificial hip joint: Secondary | ICD-10-CM | POA: Diagnosis not present

## 2016-05-13 DIAGNOSIS — M810 Age-related osteoporosis without current pathological fracture: Secondary | ICD-10-CM | POA: Diagnosis not present

## 2016-05-13 DIAGNOSIS — K219 Gastro-esophageal reflux disease without esophagitis: Secondary | ICD-10-CM | POA: Diagnosis not present

## 2016-05-13 DIAGNOSIS — G4700 Insomnia, unspecified: Secondary | ICD-10-CM | POA: Diagnosis not present

## 2016-05-13 DIAGNOSIS — F329 Major depressive disorder, single episode, unspecified: Secondary | ICD-10-CM | POA: Diagnosis not present

## 2016-05-13 DIAGNOSIS — Z6824 Body mass index (BMI) 24.0-24.9, adult: Secondary | ICD-10-CM | POA: Diagnosis not present

## 2016-05-13 DIAGNOSIS — G609 Hereditary and idiopathic neuropathy, unspecified: Secondary | ICD-10-CM | POA: Diagnosis not present

## 2016-05-13 DIAGNOSIS — J309 Allergic rhinitis, unspecified: Secondary | ICD-10-CM | POA: Diagnosis not present

## 2016-05-13 DIAGNOSIS — R609 Edema, unspecified: Secondary | ICD-10-CM | POA: Diagnosis not present

## 2016-05-13 DIAGNOSIS — K589 Irritable bowel syndrome without diarrhea: Secondary | ICD-10-CM | POA: Diagnosis not present

## 2016-05-13 DIAGNOSIS — F411 Generalized anxiety disorder: Secondary | ICD-10-CM | POA: Diagnosis not present

## 2016-05-13 DIAGNOSIS — M159 Polyosteoarthritis, unspecified: Secondary | ICD-10-CM | POA: Diagnosis not present

## 2016-05-20 DIAGNOSIS — G609 Hereditary and idiopathic neuropathy, unspecified: Secondary | ICD-10-CM | POA: Diagnosis not present

## 2016-05-20 DIAGNOSIS — M810 Age-related osteoporosis without current pathological fracture: Secondary | ICD-10-CM | POA: Diagnosis not present

## 2016-05-20 DIAGNOSIS — Z01810 Encounter for preprocedural cardiovascular examination: Secondary | ICD-10-CM | POA: Diagnosis not present

## 2016-05-20 DIAGNOSIS — F411 Generalized anxiety disorder: Secondary | ICD-10-CM | POA: Diagnosis not present

## 2016-05-20 DIAGNOSIS — J309 Allergic rhinitis, unspecified: Secondary | ICD-10-CM | POA: Diagnosis not present

## 2016-05-20 DIAGNOSIS — G4700 Insomnia, unspecified: Secondary | ICD-10-CM | POA: Diagnosis not present

## 2016-05-20 DIAGNOSIS — Z6824 Body mass index (BMI) 24.0-24.9, adult: Secondary | ICD-10-CM | POA: Diagnosis not present

## 2016-05-20 DIAGNOSIS — M159 Polyosteoarthritis, unspecified: Secondary | ICD-10-CM | POA: Diagnosis not present

## 2016-05-20 DIAGNOSIS — K219 Gastro-esophageal reflux disease without esophagitis: Secondary | ICD-10-CM | POA: Diagnosis not present

## 2016-05-20 DIAGNOSIS — F329 Major depressive disorder, single episode, unspecified: Secondary | ICD-10-CM | POA: Diagnosis not present

## 2016-05-20 DIAGNOSIS — K589 Irritable bowel syndrome without diarrhea: Secondary | ICD-10-CM | POA: Diagnosis not present

## 2016-05-20 DIAGNOSIS — R609 Edema, unspecified: Secondary | ICD-10-CM | POA: Diagnosis not present

## 2016-06-02 ENCOUNTER — Ambulatory Visit: Payer: Self-pay | Admitting: Orthopedic Surgery

## 2016-06-10 DIAGNOSIS — G609 Hereditary and idiopathic neuropathy, unspecified: Secondary | ICD-10-CM | POA: Diagnosis not present

## 2016-06-10 DIAGNOSIS — Z9181 History of falling: Secondary | ICD-10-CM | POA: Diagnosis not present

## 2016-06-10 DIAGNOSIS — J309 Allergic rhinitis, unspecified: Secondary | ICD-10-CM | POA: Diagnosis not present

## 2016-06-10 DIAGNOSIS — F329 Major depressive disorder, single episode, unspecified: Secondary | ICD-10-CM | POA: Diagnosis not present

## 2016-06-10 DIAGNOSIS — K219 Gastro-esophageal reflux disease without esophagitis: Secondary | ICD-10-CM | POA: Diagnosis not present

## 2016-06-10 DIAGNOSIS — K589 Irritable bowel syndrome without diarrhea: Secondary | ICD-10-CM | POA: Diagnosis not present

## 2016-06-10 DIAGNOSIS — G4700 Insomnia, unspecified: Secondary | ICD-10-CM | POA: Diagnosis not present

## 2016-06-10 DIAGNOSIS — M159 Polyosteoarthritis, unspecified: Secondary | ICD-10-CM | POA: Diagnosis not present

## 2016-06-10 DIAGNOSIS — Z6824 Body mass index (BMI) 24.0-24.9, adult: Secondary | ICD-10-CM | POA: Diagnosis not present

## 2016-06-10 DIAGNOSIS — M810 Age-related osteoporosis without current pathological fracture: Secondary | ICD-10-CM | POA: Diagnosis not present

## 2016-06-10 DIAGNOSIS — R609 Edema, unspecified: Secondary | ICD-10-CM | POA: Diagnosis not present

## 2016-06-10 DIAGNOSIS — F411 Generalized anxiety disorder: Secondary | ICD-10-CM | POA: Diagnosis not present

## 2016-06-12 DIAGNOSIS — G8929 Other chronic pain: Secondary | ICD-10-CM | POA: Diagnosis not present

## 2016-06-12 DIAGNOSIS — Z79891 Long term (current) use of opiate analgesic: Secondary | ICD-10-CM | POA: Diagnosis not present

## 2016-06-12 DIAGNOSIS — M25552 Pain in left hip: Secondary | ICD-10-CM | POA: Diagnosis not present

## 2016-06-12 DIAGNOSIS — M961 Postlaminectomy syndrome, not elsewhere classified: Secondary | ICD-10-CM | POA: Diagnosis not present

## 2016-06-17 ENCOUNTER — Ambulatory Visit: Payer: Self-pay | Admitting: Orthopedic Surgery

## 2016-06-17 NOTE — H&P (Signed)
TOTAL HIP REVISION ADMISSION H&P  Patient is admitted for left revision total hip arthroplasty.  Subjective:  Chief Complaint: left hip pain  HPI: Amanda Guerrero, 66 y.o. female, has a history of pain and functional disability in the right hip due to arthritis and patient has failed non-surgical conservative treatments for greater than 12 weeks to include NSAID's and/or analgesics, corticosteriod injections, flexibility and strengthening excercises, use of assistive devices, weight reduction as appropriate and activity modification. The indications for the revision total hip arthroplasty are bearing surface wear leading to  symptomatic synovitis.  Onset of symptoms was gradual starting 4 years ago with gradually worsening course since that time.  Prior procedures on the left hip include hemi-arthroplasty.  Patient currently rates pain in the left hip at 10 out of 10 with activity.  There is night pain, worsening of pain with activity and weight bearing, trendelenberg gait, pain that interfers with activities of daily living, pain with passive range of motion, crepitus and joint swelling. Patient has evidence of subchondral cysts, subchondral sclerosis and joint space narrowing by imaging studies.  This condition presents safety issues increasing the risk of falls.  This patient has had failure of hemi-arthroplasty.  There is no current active infection.  Patient Active Problem List   Diagnosis Date Noted  . 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 S/P knee replacement 04/09/2015  . Preoperative examination, unspecified 10/26/2012   Past Medical History:  Diagnosis Date  . Anxiety    takes Klonopin tid  . Arthritis   . Bruises easily   . 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 Chronic back pain   . Constipation    r/t pain meds;takes OTC stool softener  . Depression    takes Celexa daily  . GERD (gastroesophageal reflux disease)    takes Nexium bid  . Headache   . History of blood transfusion    no  abnormal reaction noted  . History of colon polyps   . History of migraine    last one about 95months ago  . History of MRSA infection 2009  . History of staph infection 2009  . Insomnia    takes Lunesta nightly  . Joint pain   . Peripheral edema    takes Furosemide daily  . Peripheral neuropathy ([facility])    takes Lyrica bid  . Urinary frequency   . Urinary urgency   . Weakness    and numbness both hands    Past Surgical History:  Procedure Laterality Date  . ABDOMINAL EXPOSURE N/A 11/03/2012   Procedure: ABDOMINAL EXPOSURE FOR LUMBAR SURGERY;  Surgeon: Rosetta Posner, MD;  Location: [facility] OR;  Service: Vascular;  Laterality: N/A;  . ABDOMINAL HYSTERECTOMY     Total  . ANTERIOR LUMBAR FUSION N/A 11/03/2012   Procedure: ALIF L5-S1;  Surgeon: Melina Schools, MD;  Location: [facility];  Service: Orthopedics;  Laterality: N/A;  . APPENDECTOMY  1980  . bladder tack     . BUNIONECTOMY Right    foot  . CARPAL TUNNEL RELEASE Bilateral   . COLONOSCOPY    . ESOPHAGOGASTRODUODENOSCOPY    . JOINT REPLACEMENT Right 2006   Hip-Total  . KNEE SURGERY  Feb. 2014   Left knee  . ROTATOR CUFF REPAIR Left    Shoulder  . skin cancer removal      removed from face   . SPINE SURGERY     Upper and lower fusion  . TARSAL TUNNEL RELEASE Bilateral   .  TOTAL KNEE ARTHROPLASTY Right 04/09/2015   Procedure: TOTAL KNEE ARTHROPLASTY;  Surgeon: Paralee Cancel, MD;  Location: [facility] ORS;  Service: Orthopedics;  Laterality: Right;  received block as well prior to patient entrance.  . TUBAL LIGATION    . WRIST SURGERY Right      (Not in a hospital admission) Allergies  Allergen Reactions  . Meloxicam Itching and Swelling    Tolerates aspirin and ibuprofen  . Tramadol Other (See Comments)    cramping    Social History  Substance Use Topics  . Smoking status: Former Smoker    Packs/day: 1.00    Years: 7.00    Types: Cigarettes    Quit date: 09/30/2004  . Smokeless tobacco: Never Used  . Alcohol use No    Family  History  Problem Relation Age of Onset  . Heart disease Mother   . Hyperlipidemia Mother   . Hypertension Mother   . Deep vein thrombosis Mother   . Heart attack Mother   . Cancer Father   . Hyperlipidemia Father   . Hypertension Father   . Deep vein thrombosis Father   . Arthritis Father   . Arthritis Maternal Uncle   . Arthritis Paternal Aunt       Review of Systems  Constitutional: Positive for diaphoresis and malaise/fatigue.  HENT: Negative.   Eyes: Positive for blurred vision.  Respiratory: Negative.   Cardiovascular: Negative.   Gastrointestinal: Positive for constipation and heartburn.  Musculoskeletal: Positive for back pain, joint pain and myalgias.  Skin: Negative.   Neurological: Positive for dizziness and headaches.  Endo/Heme/Allergies: Negative.   Psychiatric/Behavioral: Positive for depression. The patient has insomnia.     Objective:  Physical Exam  Vitals reviewed. Constitutional: She is oriented to person, place, and time. She appears well-developed and well-nourished.  HENT:  Head: Normocephalic and atraumatic.  Eyes: Conjunctivae and EOM are normal. Pupils are equal, round, and reactive to light.  Neck: Normal range of motion. Neck supple.  Cardiovascular: Normal rate, regular rhythm and intact distal pulses.   Respiratory: Effort normal. No respiratory distress.  GI: Soft. She exhibits no distension.  Genitourinary:  Genitourinary Comments: deferred  Musculoskeletal:       Left hip: She exhibits decreased range of motion and bony tenderness.       Legs: Neurological: She is alert and oriented to person, place, and time. She has normal reflexes.  Skin: Skin is warm and dry.  Psychiatric: She has a normal mood and affect. Her behavior is normal. Judgment and thought content normal.    Vital signs in last 24 hours: @VSRANGES @   Labs:   Estimated body mass index is 22.96 kg/m as calculated from the following:   Height as of 04/09/15: 5'  5" (1.651 m).   Weight as of 04/09/15: 62.6 kg (138 lb).  Imaging Review:  Plain radiographs demonstrate severe degenerative joint disease of the left hip(s). The bone quality appears to be adequate for age and reported activity level. There is end stage cartilage wear.  Assessment/Plan:  End stage arthritis, left hip(s) with failed previous arthroplasty.  The patient history, physical examination, clinical judgement of the provider and imaging studies are consistent with end stage degenerative joint disease of the left hip(s), previous total hip arthroplasty. Revision total hip arthroplasty is deemed medically necessary. The treatment options including medical management, injection therapy, arthroscopy and arthroplasty were discussed at length. The risks and benefits of total hip arthroplasty were presented and reviewed. The risks due to aseptic  loosening, infection, stiffness, dislocation/subluxation,  thromboembolic complications and other imponderables were discussed.  The patient acknowledged the explanation, agreed to proceed with the plan and consent was signed. Patient is being admitted for inpatient treatment for surgery, pain control, PT, OT, prophylactic antibiotics, VTE prophylaxis, progressive ambulation and ADL's and discharge planning. The patient is planning to be discharged home with outpatient PT

## 2016-06-23 ENCOUNTER — Other Ambulatory Visit (HOSPITAL_COMMUNITY): Payer: Self-pay | Admitting: *Deleted

## 2016-06-23 ENCOUNTER — Encounter (HOSPITAL_COMMUNITY)
Admission: RE | Admit: 2016-06-23 | Discharge: 2016-06-23 | Disposition: A | Discharge status: Home / Self Care | Payer: Medicare Other | Source: Ambulatory Visit | Attending: Orthopedic Surgery | Admitting: Orthopedic Surgery

## 2016-06-23 ENCOUNTER — Encounter (HOSPITAL_COMMUNITY): Payer: Self-pay

## 2016-06-23 DIAGNOSIS — Z01812 Encounter for preprocedural laboratory examination: Secondary | ICD-10-CM | POA: Diagnosis not present

## 2016-06-23 DIAGNOSIS — M1612 Unilateral primary osteoarthritis, left hip: Secondary | ICD-10-CM | POA: Diagnosis not present

## 2016-06-23 LAB — TYPE AND SCREEN
ABO/RH(D): A POS
Antibody Screen: NEGATIVE

## 2016-06-23 LAB — CBC
HCT: 38.2 % (ref 36.0–46.0)
Hemoglobin: 13 g/dL (ref 12.0–15.0)
MCH: 31.9 pg (ref 26.0–34.0)
MCHC: 34 g/dL (ref 30.0–36.0)
MCV: 93.9 fL (ref 78.0–100.0)
Platelets: 234 10*3/uL (ref 150–400)
RBC: 4.07 MIL/uL (ref 3.87–5.11)
RDW: 13.6 % (ref 11.5–15.5)
WBC: 5.3 10*3/uL (ref 4.0–10.5)

## 2016-06-23 LAB — BASIC METABOLIC PANEL
Anion gap: 10 (ref 5–15)
BUN: 12 mg/dL (ref 6–20)
CO2: 29 mmol/L (ref 22–32)
Calcium: 9.2 mg/dL (ref 8.9–10.3)
Chloride: 101 mmol/L (ref 101–111)
Creatinine, Ser: 0.94 mg/dL (ref 0.44–1.00)
GFR calc Af Amer: >60 mL/min (ref >60)
GFR calc non Af Amer: >60 mL/min (ref >60)
Glucose, Bld: 89 mg/dL (ref 65–99)
Potassium: 3.7 mmol/L (ref 3.5–5.1)
Sodium: 140 mmol/L (ref 135–145)

## 2016-06-23 LAB — SURGICAL PCR SCREEN
MRSA, PCR: NEGATIVE
Staphylococcus aureus: NEGATIVE

## 2016-06-23 MED ORDER — SODIUM CHLORIDE 0.9 % IV SOLN: INTRAVENOUS | Status: DC

## 2016-06-23 NOTE — Pre-Procedure Instructions (Signed)
Amanda Guerrero  06/23/2016      [id number] - [place], [place] [address] [name] [address] Phone: [phone] Fax: [phone]    Your procedure is scheduled on 06-30-2016  Monday   Report to [facility] Admitting at 11:45 A.M.  Call this number if you have problems the morning of surgery:  [phone]   Remember:  Do not eat food or drink liquids after midnight.   Take these medicines the morning of surgery with A SIP OF WATER Tylenol,citalopram(Celexa),Clonazepam(Klonopin),Methocarbamol(Robaxin) if needed,Morphine(MS CONTIN),Pantoprazole(Protonix),Pregabalin(Lyrica),       STOP ASPIRIN,ANTIINFLAMATORIES (IBUPROFEN,ALEVE,MOTRIN,ADVIL,GOODY'S POWDERS),HERBAL SUPPLEMENTS,FISH OIL,AND VITAMINS 5-7 DAYS PRIOR TO SURGERY   Do not wear jewelry, make-up or nail polish.  Do not wear lotions, powders, or perfumes, or deoderant.  Do not shave 48 hours prior to surgery.  Men may shave face and neck.  Do not bring valuables to the hospital.  [facility] is not responsible for any belongings or valuables.  Contacts, dentures or bridgework may not be worn into surgery.  Leave your suitcase in the car.  After surgery it may be brought to your room.  For patients admitted to the hospital, discharge time will be determined by your treatment team.  Patients discharged the day of surgery will not be allowed to drive home.   Special Instructions: [facility] - Preparing for Surgery  Before surgery, you can play an important role.  Because skin is not sterile, your skin needs to be as free of germs as possible.  You can reduce the number of germs on you skin by washing with CHG (chlorahexidine gluconate) soap before surgery.  CHG is an antiseptic cleaner which kills germs and bonds with the skin to continue killing germs even after washing.  Please DO NOT use if you have an allergy to CHG or antibacterial soaps.  If your  skin becomes reddened/irritated stop using the CHG and inform your nurse when you arrive at Short Stay.  Do not shave (including legs and underarms) for at least 48 hours prior to the first CHG shower.  You may shave your face.  Please follow these instructions carefully:   1.  Shower with CHG Soap the night before surgery and the   morning of Surgery.  2.  If you choose to wash your hair, wash your hair first as usual with your normal shampoo.  3.  After you shampoo, rinse your hair and body thoroughly to remove the  Shampoo.  4.  Use CHG as you would any other liquid soap.  You can apply chg directly  to the skin and wash gently with scrungie or a clean washcloth.  5.  Apply the CHG Soap to your body ONLY FROM THE NECK DOWN.   Do not use on open wounds or open sores.  Avoid contact with your eyes,  ears, mouth and genitals (private parts).  Wash genitals (private parts) with your normal soap.  6.  Wash thoroughly, paying special attention to the area where your surgery will be performed.  7.  Thoroughly rinse your body with warm water from the neck down.  8.  DO NOT shower/wash with your normal soap after using and rinsing o  the CHG Soap.  9.  Pat yourself dry with a clean towel.            10.  Wear clean pajamas.  11.  Place clean sheets on your bed the night of your first shower and do not sleep with pets.  Day of Surgery  Do not apply any lotions/deodorants the morning of surgery.  Please wear clean clothes to the hospital/surgery center.   Please read over the following fact sheets that you were given. MRSA Information and Surgical Site Infection Prevention

## 2016-06-25 NOTE — Progress Notes (Signed)
Spoke with [facility] at Dr Marguerita Beards office related to EKG tracing.  States she will send it right over fax number given to her.

## 2016-06-27 MED ORDER — ACETAMINOPHEN 10 MG/ML IV SOLN
1000.0000 mg | INTRAVENOUS | Status: AC
  Administered 2016-06-30: 1000 mg via INTRAVENOUS

## 2016-06-27 MED ORDER — SODIUM CHLORIDE 0.9 % IV SOLN
1000.0000 mg | INTRAVENOUS | Status: AC
  Administered 2016-06-30: 1000 mg via INTRAVENOUS
  Filled 2016-06-27: qty 10

## 2016-06-27 MED ORDER — CEFAZOLIN SODIUM-DEXTROSE 2-4 GM/100ML-% IV SOLN
2.0000 g | INTRAVENOUS | Status: AC
  Administered 2016-06-30: 2 g via INTRAVENOUS
  Filled 2016-06-27: qty 100

## 2016-06-27 MED ORDER — VANCOMYCIN HCL IN DEXTROSE 1-5 GM/200ML-% IV SOLN
1000.0000 mg | INTRAVENOUS | Status: AC
  Administered 2016-06-30: 1000 mg via INTRAVENOUS
  Filled 2016-06-27: qty 200

## 2016-06-30 ENCOUNTER — Encounter (HOSPITAL_COMMUNITY): Payer: Self-pay | Admitting: *Deleted

## 2016-06-30 ENCOUNTER — Inpatient Hospital Stay (HOSPITAL_COMMUNITY): Payer: Medicare Other

## 2016-06-30 ENCOUNTER — Inpatient Hospital Stay (HOSPITAL_COMMUNITY)
Admission: RE | Admit: 2016-06-30 | Discharge: 2016-07-01 | DRG: 467 | Disposition: A | Discharge status: Home / Self Care | Payer: Medicare Other | Source: Ambulatory Visit | Attending: Orthopedic Surgery | Admitting: Orthopedic Surgery

## 2016-06-30 ENCOUNTER — Inpatient Hospital Stay (HOSPITAL_COMMUNITY): Payer: Medicare Other | Admitting: Certified Registered Nurse Anesthetist

## 2016-06-30 ENCOUNTER — Encounter (HOSPITAL_COMMUNITY)
Admission: RE | Disposition: A | Discharge status: Home / Self Care | Payer: Self-pay | Source: Ambulatory Visit | Attending: Orthopedic Surgery

## 2016-06-30 DIAGNOSIS — K219 Gastro-esophageal reflux disease without esophagitis: Secondary | ICD-10-CM | POA: Diagnosis present

## 2016-06-30 DIAGNOSIS — Z8614 Personal history of Methicillin resistant Staphylococcus aureus infection: Secondary | ICD-10-CM | POA: Diagnosis not present

## 2016-06-30 DIAGNOSIS — G8929 Other chronic pain: Secondary | ICD-10-CM | POA: Diagnosis present

## 2016-06-30 DIAGNOSIS — Z981 Arthrodesis status: Secondary | ICD-10-CM

## 2016-06-30 DIAGNOSIS — Z419 Encounter for procedure for purposes other than remedying health state, unspecified: Secondary | ICD-10-CM

## 2016-06-30 DIAGNOSIS — Z96642 Presence of left artificial hip joint: Secondary | ICD-10-CM | POA: Diagnosis not present

## 2016-06-30 DIAGNOSIS — Z9851 Tubal ligation status: Secondary | ICD-10-CM

## 2016-06-30 DIAGNOSIS — Z86010 Personal history of colonic polyps: Secondary | ICD-10-CM | POA: Diagnosis not present

## 2016-06-30 DIAGNOSIS — Z90710 Acquired absence of both cervix and uterus: Secondary | ICD-10-CM

## 2016-06-30 DIAGNOSIS — Z09 Encounter for follow-up examination after completed treatment for conditions other than malignant neoplasm: Secondary | ICD-10-CM

## 2016-06-30 DIAGNOSIS — T84018A Broken internal joint prosthesis, other site, initial encounter: Secondary | ICD-10-CM

## 2016-06-30 DIAGNOSIS — M1612 Unilateral primary osteoarthritis, left hip: Secondary | ICD-10-CM | POA: Diagnosis present

## 2016-06-30 DIAGNOSIS — Z809 Family history of malignant neoplasm, unspecified: Secondary | ICD-10-CM

## 2016-06-30 DIAGNOSIS — F329 Major depressive disorder, single episode, unspecified: Secondary | ICD-10-CM | POA: Diagnosis present

## 2016-06-30 DIAGNOSIS — Z85828 Personal history of other malignant neoplasm of skin: Secondary | ICD-10-CM

## 2016-06-30 DIAGNOSIS — Z8619 Personal history of other infectious and parasitic diseases: Secondary | ICD-10-CM | POA: Diagnosis not present

## 2016-06-30 DIAGNOSIS — M549 Dorsalgia, unspecified: Secondary | ICD-10-CM | POA: Diagnosis present

## 2016-06-30 DIAGNOSIS — Z8261 Family history of arthritis: Secondary | ICD-10-CM

## 2016-06-30 DIAGNOSIS — D62 Acute posthemorrhagic anemia: Secondary | ICD-10-CM | POA: Diagnosis not present

## 2016-06-30 DIAGNOSIS — Y792 Prosthetic and other implants, materials and accessory orthopedic devices associated with adverse incidents: Secondary | ICD-10-CM | POA: Diagnosis present

## 2016-06-30 DIAGNOSIS — Z8249 Family history of ischemic heart disease and other diseases of the circulatory system: Secondary | ICD-10-CM | POA: Diagnosis not present

## 2016-06-30 DIAGNOSIS — T84091A Other mechanical complication of internal left hip prosthesis, initial encounter: Principal | ICD-10-CM | POA: Diagnosis present

## 2016-06-30 DIAGNOSIS — T8489XA Other specified complication of internal orthopedic prosthetic devices, implants and grafts, initial encounter: Secondary | ICD-10-CM | POA: Diagnosis not present

## 2016-06-30 DIAGNOSIS — Z87891 Personal history of nicotine dependence: Secondary | ICD-10-CM

## 2016-06-30 DIAGNOSIS — M25552 Pain in left hip: Secondary | ICD-10-CM | POA: Diagnosis not present

## 2016-06-30 DIAGNOSIS — M25352 Other instability, left hip: Secondary | ICD-10-CM | POA: Diagnosis not present

## 2016-06-30 DIAGNOSIS — Z888 Allergy status to other drugs, medicaments and biological substances status: Secondary | ICD-10-CM | POA: Diagnosis not present

## 2016-06-30 DIAGNOSIS — Z9889 Other specified postprocedural states: Secondary | ICD-10-CM | POA: Diagnosis not present

## 2016-06-30 DIAGNOSIS — Z471 Aftercare following joint replacement surgery: Secondary | ICD-10-CM | POA: Diagnosis not present

## 2016-06-30 DIAGNOSIS — Z96649 Presence of unspecified artificial hip joint: Secondary | ICD-10-CM

## 2016-06-30 HISTORY — PX: TOTAL HIP ARTHROPLASTY: SHX124

## 2016-06-30 SURGERY — ARTHROPLASTY, HIP, TOTAL, ANTERIOR APPROACH
Anesthesia: General | Site: Hip | Laterality: Left

## 2016-06-30 MED ORDER — SODIUM CHLORIDE 0.9 % IR SOLN
Status: DC | PRN
  Administered 2016-06-30: 3000 mL

## 2016-06-30 MED ORDER — HYDROMORPHONE HCL 1 MG/ML IJ SOLN
0.5000 mg | INTRAMUSCULAR | Status: DC | PRN
  Administered 2016-06-30: 1 mg via INTRAVENOUS
  Filled 2016-06-30: qty 1

## 2016-06-30 MED ORDER — SENNA 8.6 MG PO TABS
2.0000 | ORAL_TABLET | Freq: Every day | ORAL | Status: DC
  Administered 2016-06-30: 17.2 mg via ORAL
  Filled 2016-06-30: qty 2

## 2016-06-30 MED ORDER — CLONAZEPAM 0.5 MG PO TABS
0.2500 mg | ORAL_TABLET | Freq: Two times a day (BID) | ORAL | Status: DC
  Administered 2016-06-30 – 2016-07-01 (×2): 0.25 mg via ORAL
  Filled 2016-06-30 (×2): qty 1

## 2016-06-30 MED ORDER — PROMETHAZINE HCL 25 MG/ML IJ SOLN
6.2500 mg | INTRAMUSCULAR | Status: DC | PRN
Start: 2016-06-30 — End: 2016-06-30

## 2016-06-30 MED ORDER — ONDANSETRON HCL 4 MG PO TABS: 4.0000 mg | ORAL_TABLET | Freq: Four times a day (QID) | ORAL | Status: DC | PRN

## 2016-06-30 MED ORDER — APIXABAN 2.5 MG PO TABS
2.5000 mg | ORAL_TABLET | Freq: Two times a day (BID) | ORAL | Status: DC
  Administered 2016-07-01: 2.5 mg via ORAL
  Filled 2016-06-30: qty 1

## 2016-06-30 MED ORDER — PROPOFOL 10 MG/ML IV BOLUS
INTRAVENOUS | Status: DC | PRN
  Administered 2016-06-30: 150 mg via INTRAVENOUS

## 2016-06-30 MED ORDER — KETOROLAC TROMETHAMINE 15 MG/ML IJ SOLN
7.5000 mg | Freq: Four times a day (QID) | INTRAMUSCULAR | Status: AC
  Administered 2016-06-30 – 2016-07-01 (×4): 7.5 mg via INTRAVENOUS
  Filled 2016-06-30 (×3): qty 1

## 2016-06-30 MED ORDER — METHOCARBAMOL 500 MG PO TABS
500.0000 mg | ORAL_TABLET | Freq: Four times a day (QID) | ORAL | Status: DC | PRN
  Administered 2016-07-01: 500 mg via ORAL
  Filled 2016-06-30: qty 1

## 2016-06-30 MED ORDER — HYDROMORPHONE HCL 1 MG/ML IJ SOLN
0.2500 mg | INTRAMUSCULAR | Status: DC | PRN
  Administered 2016-06-30 (×2): 0.5 mg via INTRAVENOUS

## 2016-06-30 MED ORDER — BUPIVACAINE HCL (PF) 0.5 % IJ SOLN
INTRAMUSCULAR | Status: AC
  Filled 2016-06-30: qty 30

## 2016-06-30 MED ORDER — ALBUMIN HUMAN 5 % IV SOLN
INTRAVENOUS | Status: DC | PRN
  Administered 2016-06-30: 13:00:00 via INTRAVENOUS

## 2016-06-30 MED ORDER — ACETAMINOPHEN 650 MG RE SUPP: 650.0000 mg | Freq: Four times a day (QID) | RECTAL | Status: DC | PRN

## 2016-06-30 MED ORDER — ACETAMINOPHEN 10 MG/ML IV SOLN
INTRAVENOUS | Status: AC
  Filled 2016-06-30: qty 100

## 2016-06-30 MED ORDER — MORPHINE SULFATE ER 30 MG PO TBCR
30.0000 mg | EXTENDED_RELEASE_TABLET | Freq: Two times a day (BID) | ORAL | Status: DC
  Administered 2016-06-30 – 2016-07-01 (×2): 30 mg via ORAL
  Filled 2016-06-30 (×2): qty 1

## 2016-06-30 MED ORDER — PREGABALIN 75 MG PO CAPS
75.0000 mg | ORAL_CAPSULE | Freq: Three times a day (TID) | ORAL | Status: DC
  Administered 2016-06-30 – 2016-07-01 (×2): 75 mg via ORAL
  Filled 2016-06-30 (×2): qty 1

## 2016-06-30 MED ORDER — VANCOMYCIN HCL IN DEXTROSE 1-5 GM/200ML-% IV SOLN
1000.0000 mg | Freq: Two times a day (BID) | INTRAVENOUS | Status: AC
  Administered 2016-06-30: 1000 mg via INTRAVENOUS
  Filled 2016-06-30: qty 200

## 2016-06-30 MED ORDER — SODIUM CHLORIDE 0.9 % IV SOLN
INTRAVENOUS | Status: DC
  Administered 2016-06-30: 18:00:00 via INTRAVENOUS

## 2016-06-30 MED ORDER — DOCUSATE SODIUM 100 MG PO CAPS
100.0000 mg | ORAL_CAPSULE | Freq: Two times a day (BID) | ORAL | Status: DC
  Administered 2016-06-30 – 2016-07-01 (×2): 100 mg via ORAL
  Filled 2016-06-30 (×2): qty 1

## 2016-06-30 MED ORDER — ONDANSETRON HCL 4 MG/2ML IJ SOLN
INTRAMUSCULAR | Status: AC
  Filled 2016-06-30: qty 2

## 2016-06-30 MED ORDER — MENTHOL 3 MG MT LOZG: 1.0000 | LOZENGE | OROMUCOSAL | Status: DC | PRN

## 2016-06-30 MED ORDER — FUROSEMIDE 40 MG PO TABS
80.0000 mg | ORAL_TABLET | Freq: Every day | ORAL | Status: DC
  Administered 2016-06-30 – 2016-07-01 (×2): 80 mg via ORAL
  Filled 2016-06-30 (×2): qty 2

## 2016-06-30 MED ORDER — METOCLOPRAMIDE HCL 5 MG PO TABS: 5.0000 mg | ORAL_TABLET | Freq: Three times a day (TID) | ORAL | Status: DC | PRN

## 2016-06-30 MED ORDER — ONDANSETRON HCL 4 MG/2ML IJ SOLN: 4.0000 mg | Freq: Four times a day (QID) | INTRAMUSCULAR | Status: DC | PRN

## 2016-06-30 MED ORDER — FENTANYL CITRATE (PF) 250 MCG/5ML IJ SOLN
INTRAMUSCULAR | Status: AC
  Filled 2016-06-30: qty 5

## 2016-06-30 MED ORDER — KETOROLAC TROMETHAMINE 15 MG/ML IJ SOLN
INTRAMUSCULAR | Status: AC
  Filled 2016-06-30: qty 1

## 2016-06-30 MED ORDER — DEXAMETHASONE SODIUM PHOSPHATE 10 MG/ML IJ SOLN
10.0000 mg | Freq: Once | INTRAMUSCULAR | Status: AC
  Administered 2016-07-01: 10 mg via INTRAVENOUS
  Filled 2016-06-30: qty 1

## 2016-06-30 MED ORDER — ACETAMINOPHEN 325 MG PO TABS
650.0000 mg | ORAL_TABLET | Freq: Four times a day (QID) | ORAL | Status: DC | PRN
  Administered 2016-07-01: 650 mg via ORAL
  Filled 2016-06-30: qty 2

## 2016-06-30 MED ORDER — FAMOTIDINE 20 MG PO TABS
40.0000 mg | ORAL_TABLET | Freq: Every day | ORAL | Status: DC
  Administered 2016-06-30: 40 mg via ORAL
  Filled 2016-06-30: qty 2

## 2016-06-30 MED ORDER — DICYCLOMINE HCL 10 MG PO CAPS: 10.0000 mg | ORAL_CAPSULE | Freq: Four times a day (QID) | ORAL | Status: DC | PRN

## 2016-06-30 MED ORDER — ROCURONIUM BROMIDE 100 MG/10ML IV SOLN
INTRAVENOUS | Status: DC | PRN
  Administered 2016-06-30: 40 mg via INTRAVENOUS
  Administered 2016-06-30: 10 mg via INTRAVENOUS
  Administered 2016-06-30 (×2): 20 mg via INTRAVENOUS

## 2016-06-30 MED ORDER — KETOROLAC TROMETHAMINE 30 MG/ML IJ SOLN
INTRAMUSCULAR | Status: AC
  Filled 2016-06-30: qty 1

## 2016-06-30 MED ORDER — SODIUM CHLORIDE 0.9 % IJ SOLN
INTRAMUSCULAR | Status: DC | PRN
  Administered 2016-06-30: 30 mL

## 2016-06-30 MED ORDER — EPHEDRINE SULFATE 50 MG/ML IJ SOLN
INTRAMUSCULAR | Status: DC | PRN
  Administered 2016-06-30: 15 mg via INTRAVENOUS
  Administered 2016-06-30: 20 mg via INTRAVENOUS
  Administered 2016-06-30: 5 mg via INTRAVENOUS

## 2016-06-30 MED ORDER — METHOCARBAMOL 1000 MG/10ML IJ SOLN
500.0000 mg | Freq: Four times a day (QID) | INTRAMUSCULAR | Status: DC | PRN
  Filled 2016-06-30: qty 5

## 2016-06-30 MED ORDER — SUGAMMADEX SODIUM 200 MG/2ML IV SOLN
INTRAVENOUS | Status: DC | PRN
  Administered 2016-06-30: 140 mg via INTRAVENOUS

## 2016-06-30 MED ORDER — METOCLOPRAMIDE HCL 5 MG/ML IJ SOLN: 5.0000 mg | Freq: Three times a day (TID) | INTRAMUSCULAR | Status: DC | PRN

## 2016-06-30 MED ORDER — FENTANYL CITRATE (PF) 100 MCG/2ML IJ SOLN
INTRAMUSCULAR | Status: DC | PRN
  Administered 2016-06-30 (×5): 50 ug via INTRAVENOUS

## 2016-06-30 MED ORDER — PHENOL 1.4 % MT LIQD: 1.0000 | OROMUCOSAL | Status: DC | PRN

## 2016-06-30 MED ORDER — CITALOPRAM HYDROBROMIDE 40 MG PO TABS
40.0000 mg | ORAL_TABLET | Freq: Every day | ORAL | Status: DC
  Administered 2016-07-01: 40 mg via ORAL
  Filled 2016-06-30: qty 1

## 2016-06-30 MED ORDER — LACTATED RINGERS IV SOLN
INTRAVENOUS | Status: DC
  Administered 2016-06-30 (×2): via INTRAVENOUS

## 2016-06-30 MED ORDER — HYDROMORPHONE HCL 1 MG/ML IJ SOLN
INTRAMUSCULAR | Status: DC | PRN
  Administered 2016-06-30 (×2): .5 mg via INTRAVENOUS

## 2016-06-30 MED ORDER — TRANEXAMIC ACID 1000 MG/10ML IV SOLN
1000.0000 mg | Freq: Once | INTRAVENOUS | Status: AC
  Administered 2016-06-30: 1000 mg via INTRAVENOUS
  Filled 2016-06-30: qty 10

## 2016-06-30 MED ORDER — HYDROMORPHONE HCL 2 MG PO TABS: 2.0000 mg | ORAL_TABLET | ORAL | Status: DC | PRN

## 2016-06-30 MED ORDER — MIDAZOLAM HCL 2 MG/2ML IJ SOLN
INTRAMUSCULAR | Status: AC
  Filled 2016-06-30: qty 2

## 2016-06-30 MED ORDER — BUPIVACAINE HCL (PF) 0.5 % IJ SOLN
INTRAMUSCULAR | Status: DC | PRN
  Administered 2016-06-30: 30 mL

## 2016-06-30 MED ORDER — TRAZODONE HCL 100 MG PO TABS
100.0000 mg | ORAL_TABLET | Freq: Every day | ORAL | Status: DC
  Administered 2016-06-30: 100 mg via ORAL
  Filled 2016-06-30: qty 1

## 2016-06-30 MED ORDER — DEXAMETHASONE SODIUM PHOSPHATE 10 MG/ML IJ SOLN
INTRAMUSCULAR | Status: DC | PRN
  Administered 2016-06-30: 8 mg via INTRAVENOUS

## 2016-06-30 MED ORDER — KETOROLAC TROMETHAMINE 30 MG/ML IJ SOLN
INTRAMUSCULAR | Status: DC | PRN
  Administered 2016-06-30: 30 mg via INTRAVENOUS

## 2016-06-30 MED ORDER — DIPHENHYDRAMINE HCL 12.5 MG/5ML PO ELIX: 12.5000 mg | ORAL_SOLUTION | ORAL | Status: DC | PRN

## 2016-06-30 MED ORDER — ONDANSETRON HCL 4 MG/2ML IJ SOLN
INTRAMUSCULAR | Status: DC | PRN
  Administered 2016-06-30: 4 mg via INTRAVENOUS

## 2016-06-30 MED ORDER — CHLORHEXIDINE GLUCONATE 4 % EX LIQD: 60.0000 mL | Freq: Once | CUTANEOUS | Status: DC

## 2016-06-30 MED ORDER — HYDROMORPHONE HCL 1 MG/ML IJ SOLN
INTRAMUSCULAR | Status: AC
  Filled 2016-06-30: qty 1

## 2016-06-30 MED ORDER — LIDOCAINE HCL (CARDIAC) 20 MG/ML IV SOLN
INTRAVENOUS | Status: DC | PRN
Start: 2016-06-30 — End: 2016-06-30
  Administered 2016-06-30: 100 mg via INTRAVENOUS

## 2016-06-30 MED ORDER — HYDROMORPHONE HCL 1 MG/ML IJ SOLN
INTRAMUSCULAR | Status: AC
  Administered 2016-06-30: 0.5 mg via INTRAVENOUS
  Filled 2016-06-30: qty 1

## 2016-06-30 SURGICAL SUPPLY — 66 items
ADH SKN CLS APL DERMABOND .7 (GAUZE/BANDAGES/DRESSINGS) ×2
ALCOHOL ISOPROPYL (RUBBING) (MISCELLANEOUS) ×2 IMPLANT
BEARING DUAL 28X40 HIP (Hips) ×2 IMPLANT
BIT DRILL RINGLOC 3.2MMX20 (BIT) ×1 IMPLANT
BIT DRILL RINGLOC 3.2X20 (BIT) ×1
BIT DRILL RINGLOC QUICK CONN (BIT) ×2 IMPLANT
BLADE CLIPPER SURG (BLADE) IMPLANT
BRNG HIP 40X28 2 MBL STRL (Hips) ×1 IMPLANT
CHLORAPREP W/TINT 26ML (MISCELLANEOUS) ×2 IMPLANT
COVER SURGICAL LIGHT HANDLE (MISCELLANEOUS) ×2 IMPLANT
DERMABOND ADVANCED (GAUZE/BANDAGES/DRESSINGS) ×2
DERMABOND ADVANCED .7 DNX12 (GAUZE/BANDAGES/DRESSINGS) ×2 IMPLANT
DRAPE C-ARM 42X72 X-RAY (DRAPES) ×2 IMPLANT
DRAPE STERI IOBAN 125X83 (DRAPES) ×2 IMPLANT
DRAPE U-SHAPE 47X51 STRL (DRAPES) ×6 IMPLANT
DRILL BIT RINGLOC 3.2MMX20 (BIT) ×2
DRILL BIT RINGLOC QUICK CONN (BIT) ×4
DRSG AQUACEL AG ADV 3.5X10 (GAUZE/BANDAGES/DRESSINGS) ×2 IMPLANT
ELECT BLADE 4.0 EZ CLEAN MEGAD (MISCELLANEOUS) ×2
ELECT REM PT RETURN 9FT ADLT (ELECTROSURGICAL) ×2
ELECTRODE BLDE 4.0 EZ CLN MEGD (MISCELLANEOUS) ×1 IMPLANT
ELECTRODE REM PT RTRN 9FT ADLT (ELECTROSURGICAL) ×1 IMPLANT
EVACUATOR 1/8 PVC DRAIN (DRAIN) IMPLANT
FEMORAL HEAD LFIT V40 28MM P4 (Head) ×2 IMPLANT
GLOVE BIO SURGEON STRL SZ8.5 (GLOVE) ×4 IMPLANT
GLOVE BIOGEL PI IND STRL 8.5 (GLOVE) ×1 IMPLANT
GLOVE BIOGEL PI INDICATOR 8.5 (GLOVE) ×1
GOWN STRL REUS W/ TWL LRG LVL3 (GOWN DISPOSABLE) ×2 IMPLANT
GOWN STRL REUS W/TWL 2XL LVL3 (GOWN DISPOSABLE) ×2 IMPLANT
GOWN STRL REUS W/TWL LRG LVL3 (GOWN DISPOSABLE) ×4
HANDPIECE INTERPULSE COAX TIP (DISPOSABLE) ×2
HIP ACETABULAR SCREW 6.5X25MM (Hips) ×2 IMPLANT
HIP SHELL ACETAB 3H 50MM (Hips) ×2 IMPLANT
HOOD PEEL AWAY FACE SHEILD DIS (HOOD) ×4 IMPLANT
KIT BASIN OR (CUSTOM PROCEDURE TRAY) ×2 IMPLANT
KIT ROOM TURNOVER OR (KITS) ×2 IMPLANT
LINER DUAL MOBIL G7 40 (Liner) ×1 IMPLANT
LINER DUAL MOBILITY G7 40MM (Liner) ×2 IMPLANT
MANIFOLD NEPTUNE II (INSTRUMENTS) ×2 IMPLANT
MARKER SKIN DUAL TIP RULER LAB (MISCELLANEOUS) ×4 IMPLANT
NEEDLE SPNL 18GX3.5 QUINCKE PK (NEEDLE) ×2 IMPLANT
NS IRRIG 1000ML POUR BTL (IV SOLUTION) ×2 IMPLANT
PACK TOTAL JOINT (CUSTOM PROCEDURE TRAY) ×2 IMPLANT
PACK UNIVERSAL I (CUSTOM PROCEDURE TRAY) ×2 IMPLANT
PAD ARMBOARD 7.5X6 YLW CONV (MISCELLANEOUS) ×4 IMPLANT
SAW OSC TIP CART 19.5X105X1.3 (SAW) ×2 IMPLANT
SCREW ACETABULAR HIP 6.5X25MM (Hips) ×1 IMPLANT
SEALER BIPOLAR AQUA 6.0 (INSTRUMENTS) ×2 IMPLANT
SET HNDPC FAN SPRY TIP SCT (DISPOSABLE) ×1 IMPLANT
SHELL ACETAB HIP 3H 50MM (Hips) ×1 IMPLANT
SOLUTION BETADINE 4OZ (MISCELLANEOUS) ×2 IMPLANT
SUT ETHIBOND NAB CT1 #1 30IN (SUTURE) ×4 IMPLANT
SUT MNCRL AB 3-0 PS2 18 (SUTURE) ×2 IMPLANT
SUT MON AB 2-0 CT1 36 (SUTURE) ×2 IMPLANT
SUT VIC AB 1 CT1 27 (SUTURE) ×2
SUT VIC AB 1 CT1 27XBRD ANBCTR (SUTURE) ×1 IMPLANT
SUT VIC AB 2-0 CT1 27 (SUTURE) ×2
SUT VIC AB 2-0 CT1 TAPERPNT 27 (SUTURE) ×1 IMPLANT
SUT VLOC 180 0 24IN GS25 (SUTURE) ×2 IMPLANT
SYR 50ML LL SCALE MARK (SYRINGE) ×2 IMPLANT
TOWEL OR 17X24 6PK STRL BLUE (TOWEL DISPOSABLE) ×2 IMPLANT
TOWEL OR 17X26 10 PK STRL BLUE (TOWEL DISPOSABLE) ×2 IMPLANT
TRAY CATH 16FR W/PLASTIC CATH (SET/KITS/TRAYS/PACK) IMPLANT
TRAY FOLEY CATH 16FR SILVER (SET/KITS/TRAYS/PACK) IMPLANT
TRAY FOLEY CATH SILVER 14FR (SET/KITS/TRAYS/PACK) ×2 IMPLANT
WATER STERILE IRR 1000ML POUR (IV SOLUTION) ×6 IMPLANT

## 2016-06-30 NOTE — Interval H&P Note (Signed)
History and Physical Interval Note:  06/30/2016 10:18 AM  Amanda Guerrero  has presented today for surgery, with the diagnosis of Failed left hip hemiarthroplasty  The various methods of treatment have been discussed with the patient and family. After consideration of risks, benefits and other options for treatment, the patient has consented to  Procedure(s): Conversion from left hip hemiarthroplasty to left total hip anterior approach (Left) as a surgical intervention .  The patient's history has been reviewed, patient examined, no change in status, stable for surgery.  I have reviewed the patient's chart and labs.  Questions were answered to the patient's satisfaction.     Clare Casto, Horald Pollen

## 2016-06-30 NOTE — Op Note (Signed)
NAMETAHJ, LINDSETH NO.:  [id number]  MEDICAL RECORD NO.:  [id number]  LOCATION:  5N18C                        FACILITY:  [facility]  PHYSICIAN:  Rod Can, MD     DATE OF BIRTH:  02-Jul-1950  DATE OF PROCEDURE:  06/30/2016 DATE OF DISCHARGE:                              OPERATIVE REPORT   SURGEON:  Rod Can, M.D.  ASSISTANT:  Ky Barban, RNFA.  PREOPERATIVE DIAGNOSIS:  Failed left hip hemiarthroplasty.  POSTOPERATIVE DIAGNOSIS:  Failed left hip hemiarthroplasty.  PROCEDURE PERFORMED:  Conversion of left hip hemiarthroplasty to total hip arthroplasty.  EXPLANTS:  Stryker 28 mm -4 mm metal head with a 46 mm bipolar articulation.  IMPLANTS: 1. Biomet G7 acetabular shell, size 50 mm with 40 mm dual mobility     liner. 2. 6.5 mm acetabular screw x1. 3. Stryker 28+ 4 mm metal femoral head with Biomet dual mobility E1     bearing, size 40 mm.  ESTIMATED BLOOD LOSS:  450 mL.  COMPLICATIONS:  None.  TUBES AND DRAINS:  None.  SPECIMENS:  None.  DISPOSITION:  Stable to PACU.  INDICATIONS:  The patient is a 66 year old female, who underwent anterior approach of the left hip hemiarthroplasty for femoral neck fracture about 2 years ago.  She has had progressive groin pain with weight bearing.  Inflammatory markers were negative.  She had an intra- articular image guided lidocaine injection, which gave her excellent, but short-lived relief.  We discussed the risks, benefits, alternatives of conversion of her previous surgery to a total hip arthroplasty.  The risks, benefits, and alternatives were explained.  She elected to proceed.  DESCRIPTION OF PROCEDURE:  I identified the patient in the holding area. I marked the surgical site.  She was taken to the operating room. General anesthesia was induced.  She was positioned on the Hana table. Foley catheter was inserted.  Left hip was prepped and draped in normal sterile surgical fashion.  Time-out  was called verifying the side and site of surgery.  I began by utilizing a #10 blade to excise her previous scar.  I reestablished her tissue planes.  Hueter approach to the hip was performed.  The anterior capsule was identified.  I made an inverted T capsulotomy.  There was no evidence of infection.  Traction was applied.  I used a tamp to remove the head from the trunnion.  The hip was dislocated.  I removed the bipolar articulation.  I inspected the trunnion.  There was no damage.  The trunnion was retracted superiorly.  I placed acetabular retractors.  She did have eburnated bone within the acetabulum.  I sharply excised her labrum.  I reamed sequentially up to a 49 mm reamer.  50 mm cup was opened and impacted into place at approximately 40 degrees of abduction and 20 degrees of anteversion.  I achieved excellent press-fit stability and I chose to augment this with a single 6.5 mm cancellous screw.  The real dual mobility liner was placed.  I then assembled a trial head construct. The hip was reduced.  She clearly needed a +4 head for soft tissue tension and stability.  The hip was dislocated.  The trial head was replaced with the real head.  The hip was reduced.  At 90 degrees of external rotation, the hip was stable to an anterior directed force with the hip in full extension.  Soft tissue tension was appropriate.  The wound was copiously irrigated with saline using pulse lavage.  I closed the capsule with #1 Vicryl interrupted sutures.  Marcaine solution was injected into the periarticular tissues.  The wound was closed in a layered fashion with 2-0 Vicryl for the deep fatty tissue, 2-0 Monocryl for the deep dermal layer, running 3-0 Monocryl subcuticular stitch. Glue was applied.  Once the glue was dried, Aquacel dressing was applied.  The patient was then transferred to bed, extubated, taken to PACU in stable condition.  Sponge, needle, and instrument counts were correct at the  end of the case x2.  There were no known complications.  Postoperatively, we will admit the patient to the hospital.  She may weight bear as tolerated with a walker.  We will place her on Apixaban for DVT prophylaxis.  We will plan to discharge her home in 1-2 days. She already has outpatient physical therapy set up.          ______________________________ Rod Can, MD     BS/MEDQ  D:  06/30/2016  T:  06/30/2016  Job:  [id number]

## 2016-06-30 NOTE — Anesthesia Preprocedure Evaluation (Addendum)
Anesthesia Evaluation  Patient identified by MRN, date of birth, ID band Patient awake    Reviewed: Allergy & Precautions, NPO status , Patient's Chart, lab work & pertinent test results  Airway Mallampati: II  TM Distance: >3 FB Neck ROM: Full    Dental no notable dental hx. (+) Edentulous Upper   Pulmonary neg pulmonary ROS, former smoker,    breath sounds clear to auscultation       Cardiovascular negative cardio ROS   Rhythm:Regular Rate:Normal     Neuro/Psych    GI/Hepatic Neg liver ROS, GERD  ,  Endo/Other  negative endocrine ROS  Renal/GU negative Renal ROS     Musculoskeletal   Abdominal   Peds  Hematology negative hematology ROS (+)   Anesthesia Other Findings   Reproductive/Obstetrics                            Anesthesia Physical Anesthesia Plan  ASA: II  Anesthesia Plan: General   Post-op Pain Management:    Induction: Intravenous  Airway Management Planned: Oral ETT  Additional Equipment:   Intra-op Plan:   Post-operative Plan: Extubation in OR  Informed Consent: I have reviewed the patients History and Physical, chart, labs and discussed the procedure including the risks, benefits and alternatives for the proposed anesthesia with the patient or authorized representative who has indicated his/her understanding and acceptance.   Dental advisory given  Plan Discussed with:   Anesthesia Plan Comments:         Anesthesia Quick Evaluation

## 2016-06-30 NOTE — H&P (View-Only) (Signed)
TOTAL HIP REVISION ADMISSION H&P  Patient is admitted for left revision total hip arthroplasty.  Subjective:  Chief Complaint: left hip pain  HPI: Amanda Guerrero, 66 y.o. female, has a history of pain and functional disability in the right hip due to arthritis and patient has failed non-surgical conservative treatments for greater than 12 weeks to include NSAID's and/or analgesics, corticosteriod injections, flexibility and strengthening excercises, use of assistive devices, weight reduction as appropriate and activity modification. The indications for the revision total hip arthroplasty are bearing surface wear leading to  symptomatic synovitis.  Onset of symptoms was gradual starting 4 years ago with gradually worsening course since that time.  Prior procedures on the left hip include hemi-arthroplasty.  Patient currently rates pain in the left hip at 10 out of 10 with activity.  There is night pain, worsening of pain with activity and weight bearing, trendelenberg gait, pain that interfers with activities of daily living, pain with passive range of motion, crepitus and joint swelling. Patient has evidence of subchondral cysts, subchondral sclerosis and joint space narrowing by imaging studies.  This condition presents safety issues increasing the risk of falls.  This patient has had failure of hemi-arthroplasty.  There is no current active infection.  Patient Active Problem List   Diagnosis Date Noted  . 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 S/P knee replacement 04/09/2015  . Preoperative examination, unspecified 10/26/2012   Past Medical History:  Diagnosis Date  . Anxiety    takes Klonopin tid  . Arthritis   . Bruises easily   . 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 Chronic back pain   . Constipation    r/t pain meds;takes OTC stool softener  . Depression    takes Celexa daily  . GERD (gastroesophageal reflux disease)    takes Nexium bid  . Headache   . History of blood transfusion    no  abnormal reaction noted  . History of colon polyps   . History of migraine    last one about 59months ago  . History of MRSA infection 2009  . History of staph infection 2009  . Insomnia    takes Lunesta nightly  . Joint pain   . Peripheral edema    takes Furosemide daily  . Peripheral neuropathy (HCC)    takes Lyrica bid  . Urinary frequency   . Urinary urgency   . Weakness    and numbness both hands    Past Surgical History:  Procedure Laterality Date  . ABDOMINAL EXPOSURE N/A 11/03/2012   Procedure: ABDOMINAL EXPOSURE FOR LUMBAR SURGERY;  Surgeon: Rosetta Posner, MD;  Location: [facility] OR;  Service: Vascular;  Laterality: N/A;  . ABDOMINAL HYSTERECTOMY     Total  . ANTERIOR LUMBAR FUSION N/A 11/03/2012   Procedure: ALIF L5-S1;  Surgeon: Melina Schools, MD;  Location: [facility];  Service: Orthopedics;  Laterality: N/A;  . APPENDECTOMY  1980  . bladder tack     . BUNIONECTOMY Right    foot  . CARPAL TUNNEL RELEASE Bilateral   . COLONOSCOPY    . ESOPHAGOGASTRODUODENOSCOPY    . JOINT REPLACEMENT Right 2006   Hip-Total  . KNEE SURGERY  Feb. 2014   Left knee  . ROTATOR CUFF REPAIR Left    Shoulder  . skin cancer removal      removed from face   . SPINE SURGERY     Upper and lower fusion  . TARSAL TUNNEL RELEASE Bilateral   .  TOTAL KNEE ARTHROPLASTY Right 04/09/2015   Procedure: TOTAL KNEE ARTHROPLASTY;  Surgeon: Paralee Cancel, MD;  Location: [facility] ORS;  Service: Orthopedics;  Laterality: Right;  received block as well prior to patient entrance.  . TUBAL LIGATION    . WRIST SURGERY Right      (Not in a hospital admission) Allergies  Allergen Reactions  . Meloxicam Itching and Swelling    Tolerates aspirin and ibuprofen  . Tramadol Other (See Comments)    cramping    Social History  Substance Use Topics  . Smoking status: Former Smoker    Packs/day: 1.00    Years: 7.00    Types: Cigarettes    Quit date: 09/30/2004  . Smokeless tobacco: Never Used  . Alcohol use No    Family  History  Problem Relation Age of Onset  . Heart disease Mother   . Hyperlipidemia Mother   . Hypertension Mother   . Deep vein thrombosis Mother   . Heart attack Mother   . Cancer Father   . Hyperlipidemia Father   . Hypertension Father   . Deep vein thrombosis Father   . Arthritis Father   . Arthritis Maternal Uncle   . Arthritis Paternal Aunt       Review of Systems  Constitutional: Positive for diaphoresis and malaise/fatigue.  HENT: Negative.   Eyes: Positive for blurred vision.  Respiratory: Negative.   Cardiovascular: Negative.   Gastrointestinal: Positive for constipation and heartburn.  Musculoskeletal: Positive for back pain, joint pain and myalgias.  Skin: Negative.   Neurological: Positive for dizziness and headaches.  Endo/Heme/Allergies: Negative.   Psychiatric/Behavioral: Positive for depression. The patient has insomnia.     Objective:  Physical Exam  Vitals reviewed. Constitutional: She is oriented to person, place, and time. She appears well-developed and well-nourished.  HENT:  Head: Normocephalic and atraumatic.  Eyes: Conjunctivae and EOM are normal. Pupils are equal, round, and reactive to light.  Neck: Normal range of motion. Neck supple.  Cardiovascular: Normal rate, regular rhythm and intact distal pulses.   Respiratory: Effort normal. No respiratory distress.  GI: Soft. She exhibits no distension.  Genitourinary:  Genitourinary Comments: deferred  Musculoskeletal:       Left hip: She exhibits decreased range of motion and bony tenderness.       Legs: Neurological: She is alert and oriented to person, place, and time. She has normal reflexes.  Skin: Skin is warm and dry.  Psychiatric: She has a normal mood and affect. Her behavior is normal. Judgment and thought content normal.    Vital signs in last 24 hours: @VSRANGES @   Labs:   Estimated body mass index is 22.96 kg/m as calculated from the following:   Height as of 04/09/15: 5'  5" (1.651 m).   Weight as of 04/09/15: 62.6 kg (138 lb).  Imaging Review:  Plain radiographs demonstrate severe degenerative joint disease of the left hip(s). The bone quality appears to be adequate for age and reported activity level. There is end stage cartilage wear.  Assessment/Plan:  End stage arthritis, left hip(s) with failed previous arthroplasty.  The patient history, physical examination, clinical judgement of the provider and imaging studies are consistent with end stage degenerative joint disease of the left hip(s), previous total hip arthroplasty. Revision total hip arthroplasty is deemed medically necessary. The treatment options including medical management, injection therapy, arthroscopy and arthroplasty were discussed at length. The risks and benefits of total hip arthroplasty were presented and reviewed. The risks due to aseptic  loosening, infection, stiffness, dislocation/subluxation,  thromboembolic complications and other imponderables were discussed.  The patient acknowledged the explanation, agreed to proceed with the plan and consent was signed. Patient is being admitted for inpatient treatment for surgery, pain control, PT, OT, prophylactic antibiotics, VTE prophylaxis, progressive ambulation and ADL's and discharge planning. The patient is planning to be discharged home with outpatient PT

## 2016-06-30 NOTE — Transfer of Care (Signed)
Immediate Anesthesia Transfer of Care Note  Patient: Amanda Guerrero  Procedure(s) Performed: Procedure(s): Conversion from left hip hemiarthroplasty to left total hip anterior approach (Left)  Patient Location: PACU  Anesthesia Type:General  Level of Consciousness: awake, alert  and patient cooperative  Airway & Oxygen Therapy: Patient Spontanous Breathing  Post-op Assessment: Report given to RN and Post -op Vital signs reviewed and stable  Post vital signs: Reviewed and stable  Last Vitals:  Vitals:   06/30/16 0817  BP: (!) 116/56  Pulse: 62  Resp: 20  Temp: 36.7 C    Last Pain:  Vitals:   06/30/16 0817  TempSrc: Oral  PainSc:       Patients Stated Pain Goal: 3 (44/01/02 7253)  Complications: No apparent anesthesia complications

## 2016-06-30 NOTE — Anesthesia Procedure Notes (Signed)
Procedure Name: Intubation Date/Time: 06/30/2016 11:31 AM Performed by: Salli Quarry Maripat Borba Pre-anesthesia Checklist: Patient identified, Emergency Drugs available, Suction available and Patient being monitored Patient Re-evaluated:Patient Re-evaluated prior to inductionOxygen Delivery Method: Circle System Utilized Preoxygenation: Pre-oxygenation with 100% oxygen Intubation Type: IV induction Ventilation: Mask ventilation without difficulty Laryngoscope Size: Mac and 3 Grade View: Grade I Tube type: Oral Tube size: 7.0 mm Number of attempts: 1 Airway Equipment and Method: Stylet Placement Confirmation: ETT inserted through vocal cords under direct vision,  positive ETCO2 and breath sounds checked- equal and bilateral Secured at: 21 cm Tube secured with: Tape Dental Injury: Teeth and Oropharynx as per pre-operative assessment  Comments: Intubation by Merrily Pew, Paramedic student

## 2016-06-30 NOTE — Brief Op Note (Signed)
06/30/2016  1:55 PM  PATIENT:  Amanda Guerrero  66 y.o. female  PRE-OPERATIVE DIAGNOSIS:  Failed left hip hemiarthroplasty  POST-OPERATIVE DIAGNOSIS:  Failed left hip hemiarthroplasty   PROCEDURE:  Procedure(s): Conversion from left hip hemiarthroplasty to left total hip anterior approach (Left)  SURGEON:  Surgeon(s) and Role:    * Rod Can, MD - Primary  PHYSICIAN ASSISTANT:   ASSISTANTS: justin queen, rnfa   ANESTHESIA:   general  EBL:  Total I/O In: 1250 [I.V.:1000; IV Piggyback:250] Out: 650 [Urine:200; Blood:450]  BLOOD ADMINISTERED:none  DRAINS: none   LOCAL MEDICATIONS USED:  MARCAINE     SPECIMEN:  No Specimen  DISPOSITION OF SPECIMEN:  N/A  COUNTS:  YES  TOURNIQUET:  * No tourniquets in log *  DICTATION: .Other Dictation: Dictation Number [id number]  PLAN OF CARE: Admit to inpatient   PATIENT DISPOSITION:  PACU - hemodynamically stable.   Delay start of Pharmacological VTE agent (>24hrs) due to surgical blood loss or risk of bleeding: no

## 2016-07-01 LAB — CBC
HCT: 29 % — ABNORMAL LOW (ref 36.0–46.0)
Hemoglobin: 9.8 g/dL — ABNORMAL LOW (ref 12.0–15.0)
MCH: 32 pg (ref 26.0–34.0)
MCHC: 33.8 g/dL (ref 30.0–36.0)
MCV: 94.8 fL (ref 78.0–100.0)
Platelets: 193 10*3/uL (ref 150–400)
RBC: 3.06 MIL/uL — ABNORMAL LOW (ref 3.87–5.11)
RDW: 14.3 % (ref 11.5–15.5)
WBC: 10.8 10*3/uL — ABNORMAL HIGH (ref 4.0–10.5)

## 2016-07-01 LAB — BASIC METABOLIC PANEL
Anion gap: 9 (ref 5–15)
BUN: 9 mg/dL (ref 6–20)
CO2: 24 mmol/L (ref 22–32)
Calcium: 8.2 mg/dL — ABNORMAL LOW (ref 8.9–10.3)
Chloride: 105 mmol/L (ref 101–111)
Creatinine, Ser: 0.8 mg/dL (ref 0.44–1.00)
GFR calc Af Amer: >60 mL/min (ref >60)
GFR calc non Af Amer: >60 mL/min (ref >60)
Glucose, Bld: 134 mg/dL — ABNORMAL HIGH (ref 65–99)
Potassium: 3.5 mmol/L (ref 3.5–5.1)
Sodium: 138 mmol/L (ref 135–145)

## 2016-07-01 MED ORDER — SENNA 8.6 MG PO TABS: 2.0000 | ORAL_TABLET | Freq: Every day | ORAL | 0 refills | Status: AC

## 2016-07-01 MED ORDER — APIXABAN 2.5 MG PO TABS: 2.5000 mg | ORAL_TABLET | Freq: Two times a day (BID) | ORAL | 0 refills | Status: AC

## 2016-07-01 MED ORDER — ONDANSETRON HCL 4 MG PO TABS: 4.0000 mg | ORAL_TABLET | Freq: Four times a day (QID) | ORAL | 0 refills | Status: AC | PRN

## 2016-07-01 MED ORDER — HYDROMORPHONE HCL 2 MG PO TABS: 2.0000 mg | ORAL_TABLET | ORAL | 0 refills | Status: AC | PRN

## 2016-07-01 NOTE — Discharge Summary (Signed)
Physician Discharge Summary  Patient ID: Amanda Guerrero MRN: [id number] DOB/AGE: 1951-03-21 66 y.o.  Admit date: 06/30/2016 Discharge date: 07/01/2016  Admission Diagnoses:  Failed total hip arthroplasty [facility])  Discharge Diagnoses:  Principal Problem:   Failed total hip arthroplasty [facility])   Past Medical History:  Diagnosis Date  . Anxiety    takes Klonopin tid  . Arthritis   . Bruises easily   . 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 Chronic back pain   . Constipation    r/t pain meds;takes OTC stool softener  . Depression    takes Celexa daily  . GERD (gastroesophageal reflux disease)    takes Nexium bid  . Headache   . History of blood transfusion    no abnormal reaction noted  . History of colon polyps   . History of migraine    last one about 70months ago  . History of MRSA infection 2009  . History of staph infection 2009  . Insomnia    takes Lunesta nightly  . Joint pain   . Peripheral edema    takes Furosemide daily  . Peripheral neuropathy (HCC)    takes Lyrica bid  . Urinary frequency   . Urinary urgency   . Weakness    and numbness both hands    Surgeries: Procedure(s): Conversion from left hip hemiarthroplasty to left total hip anterior approach on 06/30/2016   Consultants (if any):   Discharged Condition: Improved  Hospital Course: Amanda Guerrero is an 66 y.o. female who was admitted 06/30/2016 with a diagnosis of Failed total hip arthroplasty ([place]) and went to the operating room on 06/30/2016 and underwent the above named procedures.    She was given perioperative antibiotics:  Anti-infectives    Start     Dose/Rate Route Frequency Ordered Stop   06/30/16 2200  vancomycin (VANCOCIN) IVPB 1000 mg/200 mL premix     1,000 mg 200 mL/hr over 60 Minutes Intravenous Every 12 hours 06/30/16 1725 06/30/16 2224   06/30/16 0700  ceFAZolin (ANCEF) IVPB 2g/100 mL premix     2 g 200 mL/hr over 30 Minutes Intravenous To [facility] 06/27/16 1006 06/30/16  1135   06/30/16 0700  vancomycin (VANCOCIN) IVPB 1000 mg/200 mL premix     1,000 mg 200 mL/hr over 60 Minutes Intravenous To [facility]Stay Surgical 06/27/16 1006 06/30/16 1111    .  She was given sequential compression devices, early ambulation, and apixaban for DVT prophylaxis.  She benefited maximally from the hospital stay and there were no complications.    Recent vital signs:  Vitals:   07/01/16 0100 07/01/16 0400  BP: 121/68 120/62  Pulse: 72 82  Resp: 16 16  Temp: 98.2 F (36.8 C) 98.4 F (36.9 C)    Recent laboratory studies:  Lab Results  Component Value Date   HGB 9.8 (L) 07/01/2016   HGB 13.0 06/23/2016   HGB 10.3 (L) 04/10/2015   Lab Results  Component Value Date   WBC 10.8 (H) 07/01/2016   PLT 193 07/01/2016   Lab Results  Component Value Date   INR 1.00 04/03/2015   Lab Results  Component Value Date   NA 138 07/01/2016   K 3.5 07/01/2016   CL 105 07/01/2016   CO2 24 07/01/2016   BUN 9 07/01/2016   CREATININE 0.80 07/01/2016   GLUCOSE 134 (H) 07/01/2016    Discharge Medications:   Allergies as of 07/01/2016      Reactions   Meloxicam Itching,  Swelling, Other (See Comments)   SWELLING REACTION UNSPECIFIED  Tolerates aspirin and ibuprofen   Tramadol Other (See Comments)   cramping      Medication List    STOP taking these medications   oxyCODONE 5 MG immediate release tablet Commonly known as:  Oxy IR/ROXICODONE   oxyCODONE-acetaminophen 10-325 MG tablet Commonly known as:  PERCOCET     TAKE these medications   acetaminophen 500 MG tablet Commonly known as:  TYLENOL Take 2 tablets (1,000 mg total) by mouth every 8 (eight) hours.   apixaban 2.5 MG Tabs tablet Commonly known as:  ELIQUIS Take 1 tablet (2.5 mg total) by mouth every 12 (twelve) hours.   citalopram 40 MG tablet Commonly known as:  CELEXA Take 40 mg by mouth daily.   clonazePAM 0.5 MG tablet Commonly known as:  KLONOPIN Take 0.25 mg by mouth 2 (two) times daily.    CRANBERRY PLUS VITAMIN C PO Take 3 tablets by mouth at bedtime.   dicyclomine 10 MG capsule Commonly known as:  BENTYL Take 10 mg by mouth 4 (four) times daily as needed for spasms.   docusate sodium 100 MG capsule Commonly known as:  COLACE Take 1 capsule (100 mg total) by mouth 2 (two) times daily.   ferrous sulfate 325 (65 FE) MG tablet Take 1 tablet (325 mg total) by mouth 3 (three) times daily after meals.   furosemide 80 MG tablet Commonly known as:  LASIX Take 80 mg by mouth daily.   HYDROmorphone 2 MG tablet Commonly known as:  DILAUDID Take 1-2 tablets (2-4 mg total) by mouth every 3 (three) hours as needed for severe pain.   methocarbamol 500 MG tablet Commonly known as:  ROBAXIN Take 1 tablet (500 mg total) by mouth every 6 (six) hours as needed for muscle spasms. What changed:  when to take this   morphine 30 MG 12 hr tablet Commonly known as:  MS CONTIN Take 30 mg by mouth 2 (two) times daily.   ondansetron 4 MG tablet Commonly known as:  ZOFRAN Take 1 tablet (4 mg total) by mouth every 6 (six) hours as needed for nausea.   polyethylene glycol packet Commonly known as:  MIRALAX / GLYCOLAX Take 17 g by mouth 2 (two) times daily.   pregabalin 75 MG capsule Commonly known as:  LYRICA Take 75 mg by mouth 3 (three) times daily.   ranitidine 300 MG tablet Commonly known as:  ZANTAC Take 300 mg by mouth every morning.   senna 8.6 MG Tabs tablet Commonly known as:  SENOKOT Take 2 tablets (17.2 mg total) by mouth at bedtime.   SUPER B COMPLEX PO Take 1 tablet by mouth at bedtime.   traZODone 100 MG tablet Commonly known as:  DESYREL Take 100 mg by mouth at bedtime.       Diagnostic Studies: Dg Pelvis Portable  Result Date: 06/30/2016 CLINICAL DATA:  Status post left hip replacement today. EXAM: PORTABLE PELVIS 1-2 VIEWS COMPARISON:  Intraoperative imaging this same day. CT abdomen and pelvis 12/19/2015. FINDINGS: Bilateral total hip arthroplasties  are in place. Left hip hemiarthroplasty seen on the prior CT scan has been converted to a new total hip replacement. The device is located. There is no fracture. Lower lumbar fusion hardware is partially imaged. IMPRESSION: Conversion of a left hip hemiarthroplasty to a total hip replacement without evidence of complication. No acute finding. Electronically Signed   By: Inge Rise M.D.   On: 06/30/2016 15:07   Dg  C-arm 61-120 Min  Result Date: 07/01/2016 CLINICAL DATA:  Inversion from left hip hemiarthroplasty to left total hip replacement. EXAM: OPERATIVE left HIP (WITH PELVIS IF PERFORMED) 1 VIEW TECHNIQUE: Fluoroscopic spot image(s) were submitted for interpretation post-operatively. COMPARISON:  CT scan of the abdomen and pelvis dated 12/19/2015 FINDINGS: The patient has, undergone conversion of the hemiarthroplasty to total hip prosthesis. The acetabular and femoral components appear in excellent position in the AP projection. IMPRESSION: Satisfactory appearance of the left hip in the AP projection after left total hip prosthesis insertion. Electronically Signed   By: Lorriane Shire M.D.   On: 06/30/2016 13:36   Dg Hip Operative Unilat W Or W/o Pelvis Left  Result Date: 06/30/2016 CLINICAL DATA:  Inversion from left hip hemiarthroplasty to left total hip replacement. EXAM: OPERATIVE left HIP (WITH PELVIS IF PERFORMED) 1 VIEW TECHNIQUE: Fluoroscopic spot image(s) were submitted for interpretation post-operatively. COMPARISON:  CT scan of the abdomen and pelvis dated 12/19/2015 FINDINGS: The patient has, undergone conversion of the hemiarthroplasty to total hip prosthesis. The acetabular and femoral components appear in excellent position in the AP projection. IMPRESSION: Satisfactory appearance of the left hip in the AP projection after left total hip prosthesis insertion. Electronically Signed   By: Lorriane Shire M.D.   On: 06/30/2016 13:36    Disposition: 01-Home or Self Care  Discharge  Instructions    Call MD / Call 911    Complete by:  As directed    If you experience chest pain or shortness of breath, CALL 911 and be transported to the hospital emergency room.  If you develope a fever above 101 F, pus (white drainage) or increased drainage or redness at the wound, or calf pain, call your surgeon's office.   Constipation Prevention    Complete by:  As directed    Drink plenty of fluids.  Prune juice may be helpful.  You may use a stool softener, such as Colace (over the counter) 100 mg twice a day.  Use MiraLax (over the counter) for constipation as needed.   Diet - low sodium heart healthy    Complete by:  As directed    Driving restrictions    Complete by:  As directed    No driving for 6 weeks   Increase activity slowly as tolerated    Complete by:  As directed    Lifting restrictions    Complete by:  As directed    No lifting for 6 weeks   TED hose    Complete by:  As directed    Use stockings (TED hose) for 2 weeks on both leg(s).  You may remove them at night for sleeping.         Signed: Elie Goody 07/01/2016, 4:20 PM

## 2016-07-01 NOTE — Progress Notes (Signed)
qPhysical Therapy Treatment Patient Details Name: Amanda Guerrero MRN: [id number] DOB: [birth year] Today's Date: 07/01/2016    History of Present Illness Pt is a 66 yo female with failed L hip hemiarthoplasty, s/p L THA anterior approach 06/30/16. PMH significant for OA, peripheral neuropathy, and chronic back pain.     PT Comments    Patient is making good progress with PT.  From a mobility standpoint anticipate patient will be ready for DC home. Pt is mod I for transfers to and from RW and mod I for ambulation within the room. Pt requires skilled PT to progress gait training and to improve LE strength and ROM to safely navigate her home environment.       Follow Up Recommendations  Outpatient PT;Supervision/Assistance - 24 hour     Equipment Recommendations  None recommended by PT (has equipment from previous surgery)    Recommendations for Other Services OT consult     Precautions / Restrictions Precautions Precautions: Anterior Hip Precaution Booklet Issued: Yes (comment) (no hip Abduction, hip extension, hip internal rotation) Restrictions Weight Bearing Restrictions: Yes LLE Weight Bearing: Weight bearing as tolerated    Mobility                   Transfers Overall transfer level: Needs assistance Equipment used: Rolling walker (2 wheeled) Transfers: Sit to/from Stand Sit to Stand: Modified independent (Device/Increase time)         General transfer comment: good hand placement for push off and descent  Ambulation/Gait Ambulation/Gait assistance: Modified independent (Device/Increase time) Ambulation Distance (Feet): 10 Feet Assistive device: Rolling walker (2 wheeled) Gait Pattern/deviations: Step-through pattern;Decreased step length - right;Decreased step length - left Gait velocity: slowed Gait velocity interpretation: Below normal speed for age/gender General Gait Details: steady gait, decreased step length with no LoB, no buckling       Balance  Overall balance assessment: Needs assistance Sitting-balance support: No upper extremity supported;Feet supported Sitting balance-Leahy Scale: Good Sitting balance - Comments: can reach outside BoS   Standing balance support: No upper extremity supported;During functional activity Standing balance-Leahy Scale: Good                              Cognition Arousal/Alertness: Awake/alert Behavior During Therapy: WFL for tasks assessed/performed Overall Cognitive Status: Within Functional Limits for tasks assessed                                        Exercises Total Joint Exercises Ankle Circles/Pumps: AROM;Both;10 reps;Seated Quad Sets: AROM;Both;10 reps;Seated Long Arc Quad: AROM;Both;10 reps;Seated Knee Flexion: AROM;Both;10 reps;Standing Marching in Standing: AROM;10 reps;Both;Standing        Pertinent Vitals/Pain Pain Assessment: Faces Faces Pain Scale: No hurt Pain Intervention(s): Monitored during session  VSS           PT Goals (current goals can now be found in the care plan section) Acute Rehab PT Goals Patient Stated Goal: go home today PT Goal Formulation: With patient Time For Goal Achievement: 07/08/16 Potential to Achieve Goals: Good    Frequency    7X/week      PT Plan Current plan remains appropriate    Co-evaluation             End of Session Equipment Utilized During Treatment: Gait belt   Patient left: in chair;with call bell/phone within reach;with family/visitor  present Nurse Communication: Mobility status PT Visit Diagnosis: Other abnormalities of gait and mobility (R26.89)     Time: 4481-8563 PT Time Calculation (min) (ACUTE ONLY): 11 min  Charges:  $Therapeutic Exercise: 8-22 mins                    G Codes:       Soraiya Ahner B. Migdalia Dk PT, DPT Acute Rehabilitation  [phone] Pager [phone]     Joiner 07/01/2016, 3:47 PM

## 2016-07-01 NOTE — Evaluation (Signed)
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 PMH significant for OA, peripheral neuropathy, and chronic back pain.   Clinical Impression  Pt is s/p THA resulting in the deficits listed below (see PT Problem List). Pt min guard for bed mobility, transfers to/from walker and toilet, and ambulation of 100 feet with RW. Pt ambulates with slow steady gait with no LoB, no buckling. Pt safely ascend/descend 4 stairs with minAx1. Pt will benefit from skilled PT to increase their independence and safety with mobility to allow discharge to the venue listed below.      Follow Up Recommendations Outpatient PT;Supervision/Assistance - 24 hour    Equipment Recommendations  None recommended by PT (has equipment from previous surgery)    Recommendations for Other Services OT consult     Precautions / Restrictions Precautions Precautions: Anterior Hip Precaution Booklet Issued: Yes (comment) (no hip Abduction, hip extension, hip internal rotation) Restrictions Weight Bearing Restrictions: Yes LLE Weight Bearing: Weight bearing as tolerated      Mobility  Bed Mobility Overal bed mobility: Needs Assistance Bed Mobility: Supine to Sit     Supine to sit: Min guard     General bed mobility comments: vc for maintaining ant hip precautions,   Transfers Overall transfer level: Needs assistance Equipment used: Rolling walker (2 wheeled) Transfers: Sit to/from Stand Sit to Stand: Min guard         General transfer comment: pt with slow steady ascent and reach up to walker   Ambulation/Gait Ambulation/Gait assistance: Min guard Ambulation Distance (Feet): 100 Feet Assistive device: Rolling walker (2 wheeled) Gait Pattern/deviations: Step-through pattern;Decreased step length - right;Trunk  flexed Gait velocity: slowed Gait velocity interpretation: Below normal speed for age/gender General Gait Details: pt with slow steady gait, no buckling, no LoB, vc for RW management, staying inside walker and upright posture   Stairs Stairs: Yes Stairs assistance: Min assist Stair Management: One rail Left;Forwards;Step to pattern Number of Stairs: 4 General stair comments: pt remembered proper sequencing from previous surgery and was safe in ascent/descent of 2x2 steps      Balance Overall balance assessment: Needs assistance Sitting-balance support: No upper extremity supported;Feet supported Sitting balance-Leahy Scale: Good Sitting balance - Comments: can reach outside BoS   Standing balance support: No upper extremity supported;During functional activity Standing balance-Leahy Scale: Fair Standing balance comment: able to wash hands with no UE support                             Pertinent Vitals/Pain Pain Assessment: 0-10 Pain Score: 4  Pain Location: L hip Pain Descriptors / Indicators: Guarding;Grimacing Pain Intervention(s): Limited activity within patient's tolerance;Monitored during session;Ice applied        VSS  Home Living Family/patient expects to be discharged to:: Private residence Living Arrangements: Non-relatives/Friends Available Help at Discharge: Friend(s);Available 24 hours/day Type of Home: House Home Access: Stairs to enter Entrance Stairs-Rails: Can reach both Entrance Stairs-Number of Steps: 4 Home Layout: One level Home Equipment: Walker - 2 wheels;Bedside commode;Hand held shower head      Prior Function Level of Independence: Independent with assistive device(s)         Comments: used RW      Hand Dominance        Extremity/Trunk Assessment  Upper Extremity Assessment Upper Extremity Assessment: Overall WFL for tasks assessed    Lower Extremity Assessment Lower Extremity Assessment: LLE deficits/detail LLE  Deficits / Details: decreased hip and knee ROM secondary to surgical site pain LLE: Unable to fully assess due to pain LLE Sensation: history of peripheral neuropathy    Cervical / Trunk Assessment Cervical / Trunk Assessment: Normal  Communication   Communication: No difficulties  Cognition Arousal/Alertness: Awake/alert Behavior During Therapy: WFL for tasks assessed/performed Overall Cognitive Status: Within Functional Limits for tasks assessed                                        General Comments      Exercises Total Joint Exercises Ankle Circles/Pumps: AROM;Both;10 reps;Seated Long Arc Quad: AROM;Both;10 reps;Seated   Assessment/Plan    PT Assessment Patient needs continued PT services  PT Problem List Decreased range of motion;Decreased activity tolerance;Pain;Decreased mobility       PT Treatment Interventions DME instruction;Gait training;Stair training;Functional mobility training;Therapeutic activities;Therapeutic exercise;Patient/family education    PT Goals (Current goals can be found in the Care Plan section)  Acute Rehab PT Goals Patient Stated Goal: go home today PT Goal Formulation: With patient Time For Goal Achievement: 07/08/16 Potential to Achieve Goals: Good    Frequency 7X/week    End of Session Equipment Utilized During Treatment: Gait belt   Patient left: in chair;with call bell/phone within reach Nurse Communication: Mobility status PT Visit Diagnosis: Other abnormalities of gait and mobility (R26.89)    Time: 5053-9767 PT Time Calculation (min) (ACUTE ONLY): 42 min   Charges:   PT Evaluation $PT Eval Low Complexity: 1 Procedure PT Treatments $Gait Training: 23-37 mins $Therapeutic Activity: 8-22 mins   PT G Codes:        Euleta Belson B. Migdalia Dk PT, DPT Acute Rehabilitation  [phone] Pager [phone]    [place] 07/01/2016, 10:19 AM

## 2016-07-01 NOTE — Progress Notes (Signed)
   Subjective:  Patient reports pain as mild to moderate.  Hip feels better already. Denies N/V/CP/SOB.  Objective:   VITALS:   Vitals:   06/30/16 1700 06/30/16 2037 07/01/16 0100 07/01/16 0400  BP: 119/65 125/64 121/68 120/62  Pulse: 65 83 72 82  Resp: 16 16 16 16   Temp: 98.1 F (36.7 C) 98.6 F (37 C) 98.2 F (36.8 C) 98.4 F (36.9 C)  TempSrc: Oral Oral Oral Oral  SpO2: 93% 93% 94% 93%  Weight:        NAD ABD soft Sensation intact distally Intact pulses distally Dorsiflexion/Plantar flexion intact Incision: dressing C/D/I Compartment soft   Lab Results  Component Value Date   WBC 10.8 (H) 07/01/2016   HGB 9.8 (L) 07/01/2016   HCT 29.0 (L) 07/01/2016   MCV 94.8 07/01/2016   PLT 193 07/01/2016   BMET    Component Value Date/Time   NA 138 07/01/2016 0538   K 3.5 07/01/2016 0538   CL 105 07/01/2016 0538   CO2 24 07/01/2016 0538   GLUCOSE 134 (H) 07/01/2016 0538   BUN 9 07/01/2016 0538   CREATININE 0.80 07/01/2016 0538   CALCIUM 8.2 (L) 07/01/2016 0538   GFRNONAA >60 07/01/2016 0538   GFRAA >60 07/01/2016 0538     Assessment/Plan: 1 Day Post-Op   Principal Problem:   Failed total hip arthroplasty ([place])   WBAT with walker DVT ppx: apixaban, SCDs, TEDs PT/OT PO pain control Dispo: D/C home, no HH services needed, outpatient PT already set up, already has DME   Amanda Guerrero, Horald Pollen 07/01/2016, 8:00 AM   Rod Can, MD Cell [phone]

## 2016-07-01 NOTE — Discharge Instructions (Signed)
°Dr. Brian Swinteck °Joint Replacement Specialist °[facility] °[address] °[place],  [address] °[phone] ° ° °TOTAL HIP REPLACEMENT POSTOPERATIVE DIRECTIONS ° ° ° °Hip Rehabilitation, Guidelines Following Surgery  ° °WEIGHT BEARING °Weight bearing as tolerated with assist device (walker, cane, etc) as directed, use it as long as suggested by your surgeon or therapist, typically at least 4-6 weeks. ° °The results of a hip operation are greatly improved after range of motion and muscle strengthening exercises. Follow all safety measures which are given to protect your hip. If any of these exercises cause increased pain or swelling in your joint, decrease the amount until you are comfortable again. Then slowly increase the exercises. Call your caregiver if you have problems or questions.  ° °HOME CARE INSTRUCTIONS  °Most of the following instructions are designed to prevent the dislocation of your new hip.  °Remove items at home which could result in a fall. This includes throw rugs or furniture in walking pathways.  °Continue medications as instructed at time of discharge. °· You may have some home medications which will be placed on hold until you complete the course of blood thinner medication. °· You may start showering once you are discharged home. Do not remove your dressing. °Do not put on socks or shoes without following the instructions of your caregivers.   °Sit on chairs with arms. Use the chair arms to help push yourself up when arising.  °Arrange for the use of a toilet seat elevator so you are not sitting low.  °· Walk with walker as instructed.  °You may resume a sexual relationship in one month or when given the OK by your caregiver.  °Use walker as long as suggested by your caregivers.  °You may put full weight on your legs and walk as much as is comfortable. °Avoid periods of inactivity such as sitting longer than an hour when not asleep. This helps prevent  blood clots.  °You may return to work once you are cleared by your surgeon.  °Do not drive a car for 6 weeks or until released by your surgeon.  °Do not drive while taking narcotics.  °Wear elastic stockings for two weeks following surgery during the day but you may remove then at night.  °Make sure you keep all of your appointments after your operation with all of your doctors and caregivers. You should call the office at the above phone number and make an appointment for approximately two weeks after the date of your surgery. °Please pick up a stool softener and laxative for home use as long as you are requiring pain medications. °· ICE to the affected hip every three hours for 30 minutes at a time and then as needed for pain and swelling. Continue to use ice on the hip for pain and swelling from surgery. You may notice swelling that will progress down to the foot and ankle.  This is normal after surgery.  Elevate the leg when you are not up walking on it.   °It is important for you to complete the blood thinner medication as prescribed by your doctor. °· Continue to use the breathing machine which will help keep your temperature down.  It is common for your temperature to cycle up and down following surgery, especially at night when you are not up moving around and exerting yourself.  The breathing machine keeps your lungs expanded and your temperature down. ° °RANGE OF MOTION AND STRENGTHENING EXERCISES  °These exercises are   are designed to help you keep full movement of your hip joint. Follow your caregiver's or physical therapist's instructions. Perform all exercises about fifteen times, three times per day or as directed. Exercise both hips, even if you have had only one joint replacement. These exercises can be done on a training (exercise) mat, on the floor, on a table or on a bed. Use whatever works the best and is most comfortable for you. Use music or television while you are exercising so that the exercises  are a pleasant break in your day. This will make your life better with the exercises acting as a break in routine you can look forward to.  °Lying on your back, slowly slide your foot toward your buttocks, raising your knee up off the floor. Then slowly slide your foot back down until your leg is straight again.  °Lying on your back spread your legs as far apart as you can without causing discomfort.  °Lying on your side, raise your upper leg and foot straight up from the floor as far as is comfortable. Slowly lower the leg and repeat.  °Lying on your back, tighten up the muscle in the front of your thigh (quadriceps muscles). You can do this by keeping your leg straight and trying to raise your heel off the floor. This helps strengthen the largest muscle supporting your knee.  °Lying on your back, tighten up the muscles of your buttocks both with the legs straight and with the knee bent at a comfortable angle while keeping your heel on the floor.  ° °SKILLED REHAB INSTRUCTIONS: °If the patient is transferred to a skilled rehab facility following release from the hospital, a list of the current medications will be sent to the facility for the patient to continue.  When discharged from the skilled rehab facility, please have the facility set up the patient's Home Health Physical Therapy prior to being released. Also, the skilled facility will be responsible for providing the patient with their medications at time of release from the facility to include their pain medication and their blood thinner medication. If the patient is still at the rehab facility at time of the two week follow up appointment, the skilled rehab facility will also need to assist the patient in arranging follow up appointment in our office and any transportation needs. ° °MAKE SURE YOU:  °Understand these instructions.  °Will watch your condition.  °Will get help right away if you are not doing well or get worse. ° °Pick up stool softner and  laxative for home use following surgery while on pain medications. °Do not remove your dressing. °The dressing is waterproof--it is OK to take showers. °Continue to use ice for pain and swelling after surgery. °Do not use any lotions or creams on the incision until instructed by your surgeon. °Total Hip Protocol. ° ° ° °Information on my medicine - ELIQUIS® (apixaban) ° °This medication education was reviewed with me or my healthcare representative as part of my discharge preparation.  The pharmacist that spoke with me during my hospital stay was:  Roshelle Traub Dien, RPH ° °Why was Eliquis® prescribed for you? °Eliquis® was prescribed for you to reduce the risk of blood clots forming after orthopedic surgery.   ° °What do You need to know about Eliquis®? °Take your Eliquis® TWICE DAILY - one tablet in the morning and one tablet in the evening with or without food.  It would be best to take the dose about the same   time each day. ° °If you have difficulty swallowing the tablet whole please discuss with your pharmacist how to take the medication safely. ° °Take Eliquis® exactly as prescribed by your doctor and DO NOT stop taking Eliquis® without talking to the doctor who prescribed the medication.  Stopping without other medication to take the place of Eliquis® may increase your risk of developing a clot. ° °After discharge, you should have regular check-up appointments with your healthcare provider that is prescribing your Eliquis®. ° °What do you do if you miss a dose? °If a dose of ELIQUIS® is not taken at the scheduled time, take it as soon as possible on the same day and twice-daily administration should be resumed.  The dose should not be doubled to make up for a missed dose.  Do not take more than one tablet of ELIQUIS at the same time. ° °Important Safety Information °A possible side effect of Eliquis® is bleeding. You should call your healthcare provider right away if you experience any of the  following: °? Bleeding from an injury or your nose that does not stop. °? Unusual colored urine (red or dark brown) or unusual colored stools (red or black). °? Unusual bruising for unknown reasons. °? A serious fall or if you hit your head (even if there is no bleeding). ° °Some medicines may interact with Eliquis® and might increase your risk of bleeding or clotting while on Eliquis®. To help avoid this, consult your healthcare provider or pharmacist prior to using any new prescription or non-prescription medications, including herbals, vitamins, non-steroidal anti-inflammatory drugs (NSAIDs) and supplements. ° °This website has more information on Eliquis® (apixaban): http://www.eliquis.com/eliquis/home ° ° °

## 2016-07-01 NOTE — Anesthesia Postprocedure Evaluation (Signed)
Anesthesia Post Note  Patient: Amanda Guerrero  Procedure(s) Performed: Procedure(s) (LRB): Conversion from left hip hemiarthroplasty to left total hip anterior approach (Left)  Patient location during evaluation: PACU Anesthesia Type: General Level of consciousness: awake and alert Pain management: pain level controlled Vital Signs Assessment: post-procedure vital signs reviewed and stable Respiratory status: spontaneous breathing, nonlabored ventilation, respiratory function stable and patient connected to nasal cannula oxygen Cardiovascular status: blood pressure returned to baseline and stable Postop Assessment: no signs of nausea or vomiting Anesthetic complications: no        Last Vitals:  Vitals:   07/01/16 0100 07/01/16 0400  BP: 121/68 120/62  Pulse: 72 82  Resp: 16 16  Temp: 36.8 C 36.9 C    Last Pain:  Vitals:   07/01/16 1408  TempSrc:   PainSc: 4    Pain Goal: Patients Stated Pain Goal: 5 (06/30/16 2004)               Montez Hageman

## 2016-07-03 DIAGNOSIS — Z96642 Presence of left artificial hip joint: Secondary | ICD-10-CM | POA: Diagnosis not present

## 2016-07-03 DIAGNOSIS — Z4789 Encounter for other orthopedic aftercare: Secondary | ICD-10-CM | POA: Diagnosis not present

## 2016-07-03 DIAGNOSIS — R2689 Other abnormalities of gait and mobility: Secondary | ICD-10-CM | POA: Diagnosis not present

## 2016-07-03 DIAGNOSIS — M6281 Muscle weakness (generalized): Secondary | ICD-10-CM | POA: Diagnosis not present

## 2016-07-03 DIAGNOSIS — M25652 Stiffness of left hip, not elsewhere classified: Secondary | ICD-10-CM | POA: Diagnosis not present

## 2016-07-03 DIAGNOSIS — M25552 Pain in left hip: Secondary | ICD-10-CM | POA: Diagnosis not present

## 2016-07-03 DIAGNOSIS — R2681 Unsteadiness on feet: Secondary | ICD-10-CM | POA: Diagnosis not present

## 2016-07-04 ENCOUNTER — Encounter (HOSPITAL_COMMUNITY): Payer: Self-pay | Admitting: Orthopedic Surgery

## 2016-07-04 ENCOUNTER — Other Ambulatory Visit: Payer: Self-pay | Admitting: Orthopedic Surgery

## 2016-07-07 DIAGNOSIS — R2689 Other abnormalities of gait and mobility: Secondary | ICD-10-CM | POA: Diagnosis not present

## 2016-07-07 DIAGNOSIS — M25652 Stiffness of left hip, not elsewhere classified: Secondary | ICD-10-CM | POA: Diagnosis not present

## 2016-07-07 DIAGNOSIS — M6281 Muscle weakness (generalized): Secondary | ICD-10-CM | POA: Diagnosis not present

## 2016-07-07 DIAGNOSIS — Z96642 Presence of left artificial hip joint: Secondary | ICD-10-CM | POA: Diagnosis not present

## 2016-07-07 DIAGNOSIS — M25552 Pain in left hip: Secondary | ICD-10-CM | POA: Diagnosis not present

## 2016-07-07 DIAGNOSIS — Z4789 Encounter for other orthopedic aftercare: Secondary | ICD-10-CM | POA: Diagnosis not present

## 2016-07-08 DIAGNOSIS — F329 Major depressive disorder, single episode, unspecified: Secondary | ICD-10-CM | POA: Diagnosis not present

## 2016-07-08 DIAGNOSIS — R609 Edema, unspecified: Secondary | ICD-10-CM | POA: Diagnosis not present

## 2016-07-08 DIAGNOSIS — J309 Allergic rhinitis, unspecified: Secondary | ICD-10-CM | POA: Diagnosis not present

## 2016-07-08 DIAGNOSIS — K219 Gastro-esophageal reflux disease without esophagitis: Secondary | ICD-10-CM | POA: Diagnosis not present

## 2016-07-08 DIAGNOSIS — K589 Irritable bowel syndrome without diarrhea: Secondary | ICD-10-CM | POA: Diagnosis not present

## 2016-07-08 DIAGNOSIS — M159 Polyosteoarthritis, unspecified: Secondary | ICD-10-CM | POA: Diagnosis not present

## 2016-07-08 DIAGNOSIS — Z6824 Body mass index (BMI) 24.0-24.9, adult: Secondary | ICD-10-CM | POA: Diagnosis not present

## 2016-07-08 DIAGNOSIS — G609 Hereditary and idiopathic neuropathy, unspecified: Secondary | ICD-10-CM | POA: Diagnosis not present

## 2016-07-08 DIAGNOSIS — F411 Generalized anxiety disorder: Secondary | ICD-10-CM | POA: Diagnosis not present

## 2016-07-08 DIAGNOSIS — M810 Age-related osteoporosis without current pathological fracture: Secondary | ICD-10-CM | POA: Diagnosis not present

## 2016-07-08 DIAGNOSIS — G4700 Insomnia, unspecified: Secondary | ICD-10-CM | POA: Diagnosis not present

## 2016-07-09 DIAGNOSIS — M6281 Muscle weakness (generalized): Secondary | ICD-10-CM | POA: Diagnosis not present

## 2016-07-09 DIAGNOSIS — M25652 Stiffness of left hip, not elsewhere classified: Secondary | ICD-10-CM | POA: Diagnosis not present

## 2016-07-09 DIAGNOSIS — M25552 Pain in left hip: Secondary | ICD-10-CM | POA: Diagnosis not present

## 2016-07-09 DIAGNOSIS — Z96642 Presence of left artificial hip joint: Secondary | ICD-10-CM | POA: Diagnosis not present

## 2016-07-09 DIAGNOSIS — Z4789 Encounter for other orthopedic aftercare: Secondary | ICD-10-CM | POA: Diagnosis not present

## 2016-07-09 DIAGNOSIS — R2689 Other abnormalities of gait and mobility: Secondary | ICD-10-CM | POA: Diagnosis not present

## 2016-07-11 DIAGNOSIS — R2689 Other abnormalities of gait and mobility: Secondary | ICD-10-CM | POA: Diagnosis not present

## 2016-07-11 DIAGNOSIS — M25652 Stiffness of left hip, not elsewhere classified: Secondary | ICD-10-CM | POA: Diagnosis not present

## 2016-07-11 DIAGNOSIS — M6281 Muscle weakness (generalized): Secondary | ICD-10-CM | POA: Diagnosis not present

## 2016-07-11 DIAGNOSIS — M25552 Pain in left hip: Secondary | ICD-10-CM | POA: Diagnosis not present

## 2016-07-11 DIAGNOSIS — Z4789 Encounter for other orthopedic aftercare: Secondary | ICD-10-CM | POA: Diagnosis not present

## 2016-07-11 DIAGNOSIS — Z96642 Presence of left artificial hip joint: Secondary | ICD-10-CM | POA: Diagnosis not present

## 2016-07-14 DIAGNOSIS — M25652 Stiffness of left hip, not elsewhere classified: Secondary | ICD-10-CM | POA: Diagnosis not present

## 2016-07-14 DIAGNOSIS — Z4789 Encounter for other orthopedic aftercare: Secondary | ICD-10-CM | POA: Diagnosis not present

## 2016-07-14 DIAGNOSIS — M25552 Pain in left hip: Secondary | ICD-10-CM | POA: Diagnosis not present

## 2016-07-14 DIAGNOSIS — M6281 Muscle weakness (generalized): Secondary | ICD-10-CM | POA: Diagnosis not present

## 2016-07-14 DIAGNOSIS — Z96642 Presence of left artificial hip joint: Secondary | ICD-10-CM | POA: Diagnosis not present

## 2016-07-14 DIAGNOSIS — R2689 Other abnormalities of gait and mobility: Secondary | ICD-10-CM | POA: Diagnosis not present

## 2016-07-15 DIAGNOSIS — Z96642 Presence of left artificial hip joint: Secondary | ICD-10-CM | POA: Diagnosis not present

## 2016-07-15 DIAGNOSIS — Z471 Aftercare following joint replacement surgery: Secondary | ICD-10-CM | POA: Diagnosis not present

## 2016-07-16 DIAGNOSIS — R2689 Other abnormalities of gait and mobility: Secondary | ICD-10-CM | POA: Diagnosis not present

## 2016-07-16 DIAGNOSIS — M25552 Pain in left hip: Secondary | ICD-10-CM | POA: Diagnosis not present

## 2016-07-16 DIAGNOSIS — Z96642 Presence of left artificial hip joint: Secondary | ICD-10-CM | POA: Diagnosis not present

## 2016-07-16 DIAGNOSIS — M25652 Stiffness of left hip, not elsewhere classified: Secondary | ICD-10-CM | POA: Diagnosis not present

## 2016-07-16 DIAGNOSIS — Z4789 Encounter for other orthopedic aftercare: Secondary | ICD-10-CM | POA: Diagnosis not present

## 2016-07-16 DIAGNOSIS — M6281 Muscle weakness (generalized): Secondary | ICD-10-CM | POA: Diagnosis not present

## 2016-07-18 DIAGNOSIS — M25552 Pain in left hip: Secondary | ICD-10-CM | POA: Diagnosis not present

## 2016-07-18 DIAGNOSIS — Z96642 Presence of left artificial hip joint: Secondary | ICD-10-CM | POA: Diagnosis not present

## 2016-07-18 DIAGNOSIS — M6281 Muscle weakness (generalized): Secondary | ICD-10-CM | POA: Diagnosis not present

## 2016-07-18 DIAGNOSIS — Z4789 Encounter for other orthopedic aftercare: Secondary | ICD-10-CM | POA: Diagnosis not present

## 2016-07-18 DIAGNOSIS — M25652 Stiffness of left hip, not elsewhere classified: Secondary | ICD-10-CM | POA: Diagnosis not present

## 2016-07-18 DIAGNOSIS — R2689 Other abnormalities of gait and mobility: Secondary | ICD-10-CM | POA: Diagnosis not present

## 2016-07-22 DIAGNOSIS — G4700 Insomnia, unspecified: Secondary | ICD-10-CM | POA: Diagnosis not present

## 2016-07-22 DIAGNOSIS — K589 Irritable bowel syndrome without diarrhea: Secondary | ICD-10-CM | POA: Diagnosis not present

## 2016-07-22 DIAGNOSIS — N390 Urinary tract infection, site not specified: Secondary | ICD-10-CM | POA: Diagnosis not present

## 2016-07-22 DIAGNOSIS — M159 Polyosteoarthritis, unspecified: Secondary | ICD-10-CM | POA: Diagnosis not present

## 2016-07-22 DIAGNOSIS — K219 Gastro-esophageal reflux disease without esophagitis: Secondary | ICD-10-CM | POA: Diagnosis not present

## 2016-07-22 DIAGNOSIS — G609 Hereditary and idiopathic neuropathy, unspecified: Secondary | ICD-10-CM | POA: Diagnosis not present

## 2016-07-22 DIAGNOSIS — M810 Age-related osteoporosis without current pathological fracture: Secondary | ICD-10-CM | POA: Diagnosis not present

## 2016-07-22 DIAGNOSIS — F329 Major depressive disorder, single episode, unspecified: Secondary | ICD-10-CM | POA: Diagnosis not present

## 2016-07-22 DIAGNOSIS — Z6824 Body mass index (BMI) 24.0-24.9, adult: Secondary | ICD-10-CM | POA: Diagnosis not present

## 2016-07-22 DIAGNOSIS — J309 Allergic rhinitis, unspecified: Secondary | ICD-10-CM | POA: Diagnosis not present

## 2016-07-22 DIAGNOSIS — R609 Edema, unspecified: Secondary | ICD-10-CM | POA: Diagnosis not present

## 2016-07-22 DIAGNOSIS — F411 Generalized anxiety disorder: Secondary | ICD-10-CM | POA: Diagnosis not present

## 2016-08-05 DIAGNOSIS — F411 Generalized anxiety disorder: Secondary | ICD-10-CM | POA: Diagnosis not present

## 2016-08-05 DIAGNOSIS — G609 Hereditary and idiopathic neuropathy, unspecified: Secondary | ICD-10-CM | POA: Diagnosis not present

## 2016-08-05 DIAGNOSIS — F329 Major depressive disorder, single episode, unspecified: Secondary | ICD-10-CM | POA: Diagnosis not present

## 2016-08-05 DIAGNOSIS — M810 Age-related osteoporosis without current pathological fracture: Secondary | ICD-10-CM | POA: Diagnosis not present

## 2016-08-05 DIAGNOSIS — G4700 Insomnia, unspecified: Secondary | ICD-10-CM | POA: Diagnosis not present

## 2016-08-05 DIAGNOSIS — R609 Edema, unspecified: Secondary | ICD-10-CM | POA: Diagnosis not present

## 2016-08-05 DIAGNOSIS — M159 Polyosteoarthritis, unspecified: Secondary | ICD-10-CM | POA: Diagnosis not present

## 2016-08-05 DIAGNOSIS — K589 Irritable bowel syndrome without diarrhea: Secondary | ICD-10-CM | POA: Diagnosis not present

## 2016-08-05 DIAGNOSIS — Z6824 Body mass index (BMI) 24.0-24.9, adult: Secondary | ICD-10-CM | POA: Diagnosis not present

## 2016-08-05 DIAGNOSIS — K219 Gastro-esophageal reflux disease without esophagitis: Secondary | ICD-10-CM | POA: Diagnosis not present

## 2016-08-06 DIAGNOSIS — N959 Unspecified menopausal and perimenopausal disorder: Secondary | ICD-10-CM | POA: Diagnosis not present

## 2016-08-06 DIAGNOSIS — Z79899 Other long term (current) drug therapy: Secondary | ICD-10-CM | POA: Diagnosis not present

## 2016-08-06 DIAGNOSIS — Z9181 History of falling: Secondary | ICD-10-CM | POA: Diagnosis not present

## 2016-08-06 DIAGNOSIS — Z1231 Encounter for screening mammogram for malignant neoplasm of breast: Secondary | ICD-10-CM | POA: Diagnosis not present

## 2016-08-06 DIAGNOSIS — Z0000 Encounter for general adult medical examination without abnormal findings: Secondary | ICD-10-CM | POA: Diagnosis not present

## 2016-08-13 DIAGNOSIS — Z96642 Presence of left artificial hip joint: Secondary | ICD-10-CM | POA: Diagnosis not present

## 2016-08-13 DIAGNOSIS — Z471 Aftercare following joint replacement surgery: Secondary | ICD-10-CM | POA: Diagnosis not present

## 2016-08-19 DIAGNOSIS — M48061 Spinal stenosis, lumbar region without neurogenic claudication: Secondary | ICD-10-CM | POA: Diagnosis not present

## 2016-08-19 DIAGNOSIS — G609 Hereditary and idiopathic neuropathy, unspecified: Secondary | ICD-10-CM | POA: Diagnosis not present

## 2016-08-19 DIAGNOSIS — R269 Unspecified abnormalities of gait and mobility: Secondary | ICD-10-CM | POA: Diagnosis not present

## 2016-08-19 DIAGNOSIS — E538 Deficiency of other specified B group vitamins: Secondary | ICD-10-CM | POA: Diagnosis not present

## 2016-08-19 DIAGNOSIS — L98499 Non-pressure chronic ulcer of skin of other sites with unspecified severity: Secondary | ICD-10-CM | POA: Diagnosis not present

## 2016-08-20 DIAGNOSIS — R2689 Other abnormalities of gait and mobility: Secondary | ICD-10-CM | POA: Diagnosis not present

## 2016-08-20 DIAGNOSIS — M25652 Stiffness of left hip, not elsewhere classified: Secondary | ICD-10-CM | POA: Diagnosis not present

## 2016-08-20 DIAGNOSIS — M25661 Stiffness of right knee, not elsewhere classified: Secondary | ICD-10-CM | POA: Diagnosis not present

## 2016-08-20 DIAGNOSIS — R2681 Unsteadiness on feet: Secondary | ICD-10-CM | POA: Diagnosis not present

## 2016-08-20 DIAGNOSIS — M25651 Stiffness of right hip, not elsewhere classified: Secondary | ICD-10-CM | POA: Diagnosis not present

## 2016-08-20 DIAGNOSIS — M6281 Muscle weakness (generalized): Secondary | ICD-10-CM | POA: Diagnosis not present

## 2016-08-20 DIAGNOSIS — Z471 Aftercare following joint replacement surgery: Secondary | ICD-10-CM | POA: Diagnosis not present

## 2016-08-20 DIAGNOSIS — M79652 Pain in left thigh: Secondary | ICD-10-CM | POA: Diagnosis not present

## 2016-08-21 DIAGNOSIS — R2681 Unsteadiness on feet: Secondary | ICD-10-CM | POA: Diagnosis not present

## 2016-08-21 DIAGNOSIS — M25652 Stiffness of left hip, not elsewhere classified: Secondary | ICD-10-CM | POA: Diagnosis not present

## 2016-08-21 DIAGNOSIS — Z471 Aftercare following joint replacement surgery: Secondary | ICD-10-CM | POA: Diagnosis not present

## 2016-08-21 DIAGNOSIS — M6281 Muscle weakness (generalized): Secondary | ICD-10-CM | POA: Diagnosis not present

## 2016-08-21 DIAGNOSIS — M79652 Pain in left thigh: Secondary | ICD-10-CM | POA: Diagnosis not present

## 2016-08-21 DIAGNOSIS — R2689 Other abnormalities of gait and mobility: Secondary | ICD-10-CM | POA: Diagnosis not present

## 2016-08-22 DIAGNOSIS — M810 Age-related osteoporosis without current pathological fracture: Secondary | ICD-10-CM | POA: Diagnosis not present

## 2016-08-22 DIAGNOSIS — Z1231 Encounter for screening mammogram for malignant neoplasm of breast: Secondary | ICD-10-CM | POA: Diagnosis not present

## 2016-08-22 DIAGNOSIS — N959 Unspecified menopausal and perimenopausal disorder: Secondary | ICD-10-CM | POA: Diagnosis not present

## 2016-08-22 DIAGNOSIS — M818 Other osteoporosis without current pathological fracture: Secondary | ICD-10-CM | POA: Diagnosis not present

## 2016-08-25 DIAGNOSIS — R928 Other abnormal and inconclusive findings on diagnostic imaging of breast: Secondary | ICD-10-CM | POA: Diagnosis not present

## 2016-08-25 DIAGNOSIS — M25661 Stiffness of right knee, not elsewhere classified: Secondary | ICD-10-CM | POA: Diagnosis not present

## 2016-08-25 DIAGNOSIS — M6281 Muscle weakness (generalized): Secondary | ICD-10-CM | POA: Diagnosis not present

## 2016-08-25 DIAGNOSIS — M25651 Stiffness of right hip, not elsewhere classified: Secondary | ICD-10-CM | POA: Diagnosis not present

## 2016-08-25 DIAGNOSIS — M25652 Stiffness of left hip, not elsewhere classified: Secondary | ICD-10-CM | POA: Diagnosis not present

## 2016-08-25 DIAGNOSIS — Z471 Aftercare following joint replacement surgery: Secondary | ICD-10-CM | POA: Diagnosis not present

## 2016-08-25 DIAGNOSIS — R2681 Unsteadiness on feet: Secondary | ICD-10-CM | POA: Diagnosis not present

## 2016-08-25 DIAGNOSIS — M79652 Pain in left thigh: Secondary | ICD-10-CM | POA: Diagnosis not present

## 2016-08-25 DIAGNOSIS — R2689 Other abnormalities of gait and mobility: Secondary | ICD-10-CM | POA: Diagnosis not present

## 2016-08-29 DIAGNOSIS — M6281 Muscle weakness (generalized): Secondary | ICD-10-CM | POA: Diagnosis not present

## 2016-08-29 DIAGNOSIS — R2689 Other abnormalities of gait and mobility: Secondary | ICD-10-CM | POA: Diagnosis not present

## 2016-08-29 DIAGNOSIS — R2681 Unsteadiness on feet: Secondary | ICD-10-CM | POA: Diagnosis not present

## 2016-08-29 DIAGNOSIS — Z471 Aftercare following joint replacement surgery: Secondary | ICD-10-CM | POA: Diagnosis not present

## 2016-08-29 DIAGNOSIS — M25652 Stiffness of left hip, not elsewhere classified: Secondary | ICD-10-CM | POA: Diagnosis not present

## 2016-08-29 DIAGNOSIS — M79652 Pain in left thigh: Secondary | ICD-10-CM | POA: Diagnosis not present

## 2016-09-01 DIAGNOSIS — R2689 Other abnormalities of gait and mobility: Secondary | ICD-10-CM | POA: Diagnosis not present

## 2016-09-01 DIAGNOSIS — M79652 Pain in left thigh: Secondary | ICD-10-CM | POA: Diagnosis not present

## 2016-09-01 DIAGNOSIS — R2681 Unsteadiness on feet: Secondary | ICD-10-CM | POA: Diagnosis not present

## 2016-09-01 DIAGNOSIS — Z471 Aftercare following joint replacement surgery: Secondary | ICD-10-CM | POA: Diagnosis not present

## 2016-09-01 DIAGNOSIS — M25652 Stiffness of left hip, not elsewhere classified: Secondary | ICD-10-CM | POA: Diagnosis not present

## 2016-09-01 DIAGNOSIS — M6281 Muscle weakness (generalized): Secondary | ICD-10-CM | POA: Diagnosis not present

## 2016-09-02 DIAGNOSIS — M810 Age-related osteoporosis without current pathological fracture: Secondary | ICD-10-CM | POA: Diagnosis not present

## 2016-09-02 DIAGNOSIS — F411 Generalized anxiety disorder: Secondary | ICD-10-CM | POA: Diagnosis not present

## 2016-09-02 DIAGNOSIS — M159 Polyosteoarthritis, unspecified: Secondary | ICD-10-CM | POA: Diagnosis not present

## 2016-09-02 DIAGNOSIS — K589 Irritable bowel syndrome without diarrhea: Secondary | ICD-10-CM | POA: Diagnosis not present

## 2016-09-02 DIAGNOSIS — G4700 Insomnia, unspecified: Secondary | ICD-10-CM | POA: Diagnosis not present

## 2016-09-02 DIAGNOSIS — Z6825 Body mass index (BMI) 25.0-25.9, adult: Secondary | ICD-10-CM | POA: Diagnosis not present

## 2016-09-02 DIAGNOSIS — C609 Malignant neoplasm of penis, unspecified: Secondary | ICD-10-CM | POA: Diagnosis not present

## 2016-09-02 DIAGNOSIS — R609 Edema, unspecified: Secondary | ICD-10-CM | POA: Diagnosis not present

## 2016-09-02 DIAGNOSIS — J309 Allergic rhinitis, unspecified: Secondary | ICD-10-CM | POA: Diagnosis not present

## 2016-09-02 DIAGNOSIS — F329 Major depressive disorder, single episode, unspecified: Secondary | ICD-10-CM | POA: Diagnosis not present

## 2016-09-02 DIAGNOSIS — K219 Gastro-esophageal reflux disease without esophagitis: Secondary | ICD-10-CM | POA: Diagnosis not present

## 2016-09-02 DIAGNOSIS — Z79899 Other long term (current) drug therapy: Secondary | ICD-10-CM | POA: Diagnosis not present

## 2016-09-04 DIAGNOSIS — R2689 Other abnormalities of gait and mobility: Secondary | ICD-10-CM | POA: Diagnosis not present

## 2016-09-04 DIAGNOSIS — M6281 Muscle weakness (generalized): Secondary | ICD-10-CM | POA: Diagnosis not present

## 2016-09-04 DIAGNOSIS — M79652 Pain in left thigh: Secondary | ICD-10-CM | POA: Diagnosis not present

## 2016-09-04 DIAGNOSIS — Z471 Aftercare following joint replacement surgery: Secondary | ICD-10-CM | POA: Diagnosis not present

## 2016-09-04 DIAGNOSIS — M25652 Stiffness of left hip, not elsewhere classified: Secondary | ICD-10-CM | POA: Diagnosis not present

## 2016-09-04 DIAGNOSIS — R2681 Unsteadiness on feet: Secondary | ICD-10-CM | POA: Diagnosis not present

## 2016-09-08 DIAGNOSIS — M79652 Pain in left thigh: Secondary | ICD-10-CM | POA: Diagnosis not present

## 2016-09-08 DIAGNOSIS — M6281 Muscle weakness (generalized): Secondary | ICD-10-CM | POA: Diagnosis not present

## 2016-09-08 DIAGNOSIS — Z471 Aftercare following joint replacement surgery: Secondary | ICD-10-CM | POA: Diagnosis not present

## 2016-09-08 DIAGNOSIS — R2681 Unsteadiness on feet: Secondary | ICD-10-CM | POA: Diagnosis not present

## 2016-09-08 DIAGNOSIS — M25652 Stiffness of left hip, not elsewhere classified: Secondary | ICD-10-CM | POA: Diagnosis not present

## 2016-09-08 DIAGNOSIS — R2689 Other abnormalities of gait and mobility: Secondary | ICD-10-CM | POA: Diagnosis not present

## 2016-09-11 DIAGNOSIS — M25652 Stiffness of left hip, not elsewhere classified: Secondary | ICD-10-CM | POA: Diagnosis not present

## 2016-09-11 DIAGNOSIS — N6321 Unspecified lump in the left breast, upper outer quadrant: Secondary | ICD-10-CM | POA: Diagnosis not present

## 2016-09-11 DIAGNOSIS — M79652 Pain in left thigh: Secondary | ICD-10-CM | POA: Diagnosis not present

## 2016-09-11 DIAGNOSIS — M6281 Muscle weakness (generalized): Secondary | ICD-10-CM | POA: Diagnosis not present

## 2016-09-11 DIAGNOSIS — R2689 Other abnormalities of gait and mobility: Secondary | ICD-10-CM | POA: Diagnosis not present

## 2016-09-11 DIAGNOSIS — R2681 Unsteadiness on feet: Secondary | ICD-10-CM | POA: Diagnosis not present

## 2016-09-11 DIAGNOSIS — R928 Other abnormal and inconclusive findings on diagnostic imaging of breast: Secondary | ICD-10-CM | POA: Diagnosis not present

## 2016-09-11 DIAGNOSIS — Z471 Aftercare following joint replacement surgery: Secondary | ICD-10-CM | POA: Diagnosis not present

## 2016-09-15 DIAGNOSIS — C50412 Malignant neoplasm of upper-outer quadrant of left female breast: Secondary | ICD-10-CM | POA: Diagnosis not present

## 2016-09-15 DIAGNOSIS — C50912 Malignant neoplasm of unspecified site of left female breast: Secondary | ICD-10-CM | POA: Diagnosis not present

## 2016-09-15 DIAGNOSIS — N6321 Unspecified lump in the left breast, upper outer quadrant: Secondary | ICD-10-CM | POA: Diagnosis not present

## 2016-09-16 DIAGNOSIS — M79652 Pain in left thigh: Secondary | ICD-10-CM | POA: Diagnosis not present

## 2016-09-16 DIAGNOSIS — M6281 Muscle weakness (generalized): Secondary | ICD-10-CM | POA: Diagnosis not present

## 2016-09-16 DIAGNOSIS — R2681 Unsteadiness on feet: Secondary | ICD-10-CM | POA: Diagnosis not present

## 2016-09-16 DIAGNOSIS — M25652 Stiffness of left hip, not elsewhere classified: Secondary | ICD-10-CM | POA: Diagnosis not present

## 2016-09-16 DIAGNOSIS — R2689 Other abnormalities of gait and mobility: Secondary | ICD-10-CM | POA: Diagnosis not present

## 2016-09-16 DIAGNOSIS — Z471 Aftercare following joint replacement surgery: Secondary | ICD-10-CM | POA: Diagnosis not present

## 2016-09-18 DIAGNOSIS — M79652 Pain in left thigh: Secondary | ICD-10-CM | POA: Diagnosis not present

## 2016-09-18 DIAGNOSIS — M6281 Muscle weakness (generalized): Secondary | ICD-10-CM | POA: Diagnosis not present

## 2016-09-18 DIAGNOSIS — R2681 Unsteadiness on feet: Secondary | ICD-10-CM | POA: Diagnosis not present

## 2016-09-18 DIAGNOSIS — Z471 Aftercare following joint replacement surgery: Secondary | ICD-10-CM | POA: Diagnosis not present

## 2016-09-18 DIAGNOSIS — M25652 Stiffness of left hip, not elsewhere classified: Secondary | ICD-10-CM | POA: Diagnosis not present

## 2016-09-18 DIAGNOSIS — R2689 Other abnormalities of gait and mobility: Secondary | ICD-10-CM | POA: Diagnosis not present

## 2016-09-19 DIAGNOSIS — C50912 Malignant neoplasm of unspecified site of left female breast: Secondary | ICD-10-CM | POA: Diagnosis not present

## 2016-09-19 DIAGNOSIS — M159 Polyosteoarthritis, unspecified: Secondary | ICD-10-CM | POA: Diagnosis not present

## 2016-09-19 DIAGNOSIS — R609 Edema, unspecified: Secondary | ICD-10-CM | POA: Diagnosis not present

## 2016-09-19 DIAGNOSIS — G4700 Insomnia, unspecified: Secondary | ICD-10-CM | POA: Diagnosis not present

## 2016-09-19 DIAGNOSIS — K589 Irritable bowel syndrome without diarrhea: Secondary | ICD-10-CM | POA: Diagnosis not present

## 2016-09-19 DIAGNOSIS — Z6825 Body mass index (BMI) 25.0-25.9, adult: Secondary | ICD-10-CM | POA: Diagnosis not present

## 2016-09-19 DIAGNOSIS — G609 Hereditary and idiopathic neuropathy, unspecified: Secondary | ICD-10-CM | POA: Diagnosis not present

## 2016-09-19 DIAGNOSIS — F329 Major depressive disorder, single episode, unspecified: Secondary | ICD-10-CM | POA: Diagnosis not present

## 2016-09-19 DIAGNOSIS — M810 Age-related osteoporosis without current pathological fracture: Secondary | ICD-10-CM | POA: Diagnosis not present

## 2016-09-19 DIAGNOSIS — J309 Allergic rhinitis, unspecified: Secondary | ICD-10-CM | POA: Diagnosis not present

## 2016-09-19 DIAGNOSIS — F411 Generalized anxiety disorder: Secondary | ICD-10-CM | POA: Diagnosis not present

## 2016-09-19 DIAGNOSIS — K219 Gastro-esophageal reflux disease without esophagitis: Secondary | ICD-10-CM | POA: Diagnosis not present

## 2016-09-21 HISTORY — PX: BREAST BIOPSY: SHX20

## 2016-09-23 DIAGNOSIS — M79652 Pain in left thigh: Secondary | ICD-10-CM | POA: Diagnosis not present

## 2016-09-23 DIAGNOSIS — Z471 Aftercare following joint replacement surgery: Secondary | ICD-10-CM | POA: Diagnosis not present

## 2016-09-23 DIAGNOSIS — R2681 Unsteadiness on feet: Secondary | ICD-10-CM | POA: Diagnosis not present

## 2016-09-23 DIAGNOSIS — M6281 Muscle weakness (generalized): Secondary | ICD-10-CM | POA: Diagnosis not present

## 2016-09-23 DIAGNOSIS — M25661 Stiffness of right knee, not elsewhere classified: Secondary | ICD-10-CM | POA: Diagnosis not present

## 2016-09-23 DIAGNOSIS — M25652 Stiffness of left hip, not elsewhere classified: Secondary | ICD-10-CM | POA: Diagnosis not present

## 2016-09-23 DIAGNOSIS — M25651 Stiffness of right hip, not elsewhere classified: Secondary | ICD-10-CM | POA: Diagnosis not present

## 2016-09-23 DIAGNOSIS — R2689 Other abnormalities of gait and mobility: Secondary | ICD-10-CM | POA: Diagnosis not present

## 2016-09-25 DIAGNOSIS — Z471 Aftercare following joint replacement surgery: Secondary | ICD-10-CM | POA: Diagnosis not present

## 2016-09-25 DIAGNOSIS — M79652 Pain in left thigh: Secondary | ICD-10-CM | POA: Diagnosis not present

## 2016-09-25 DIAGNOSIS — M6281 Muscle weakness (generalized): Secondary | ICD-10-CM | POA: Diagnosis not present

## 2016-09-25 DIAGNOSIS — R2689 Other abnormalities of gait and mobility: Secondary | ICD-10-CM | POA: Diagnosis not present

## 2016-09-25 DIAGNOSIS — R2681 Unsteadiness on feet: Secondary | ICD-10-CM | POA: Diagnosis not present

## 2016-09-25 DIAGNOSIS — M25652 Stiffness of left hip, not elsewhere classified: Secondary | ICD-10-CM | POA: Diagnosis not present

## 2016-09-29 DIAGNOSIS — M79652 Pain in left thigh: Secondary | ICD-10-CM | POA: Diagnosis not present

## 2016-09-29 DIAGNOSIS — R2689 Other abnormalities of gait and mobility: Secondary | ICD-10-CM | POA: Diagnosis not present

## 2016-09-29 DIAGNOSIS — R2681 Unsteadiness on feet: Secondary | ICD-10-CM | POA: Diagnosis not present

## 2016-09-29 DIAGNOSIS — Z471 Aftercare following joint replacement surgery: Secondary | ICD-10-CM | POA: Diagnosis not present

## 2016-09-29 DIAGNOSIS — M6281 Muscle weakness (generalized): Secondary | ICD-10-CM | POA: Diagnosis not present

## 2016-09-29 DIAGNOSIS — M25652 Stiffness of left hip, not elsewhere classified: Secondary | ICD-10-CM | POA: Diagnosis not present

## 2016-10-02 DIAGNOSIS — M25561 Pain in right knee: Secondary | ICD-10-CM | POA: Diagnosis not present

## 2016-10-02 DIAGNOSIS — M961 Postlaminectomy syndrome, not elsewhere classified: Secondary | ICD-10-CM | POA: Diagnosis not present

## 2016-10-02 DIAGNOSIS — G8929 Other chronic pain: Secondary | ICD-10-CM | POA: Diagnosis not present

## 2016-10-02 DIAGNOSIS — M25552 Pain in left hip: Secondary | ICD-10-CM | POA: Diagnosis not present

## 2016-10-07 DIAGNOSIS — R2689 Other abnormalities of gait and mobility: Secondary | ICD-10-CM | POA: Diagnosis not present

## 2016-10-07 DIAGNOSIS — M6281 Muscle weakness (generalized): Secondary | ICD-10-CM | POA: Diagnosis not present

## 2016-10-07 DIAGNOSIS — M25652 Stiffness of left hip, not elsewhere classified: Secondary | ICD-10-CM | POA: Diagnosis not present

## 2016-10-07 DIAGNOSIS — R2681 Unsteadiness on feet: Secondary | ICD-10-CM | POA: Diagnosis not present

## 2016-10-07 DIAGNOSIS — M79652 Pain in left thigh: Secondary | ICD-10-CM | POA: Diagnosis not present

## 2016-10-07 DIAGNOSIS — Z471 Aftercare following joint replacement surgery: Secondary | ICD-10-CM | POA: Diagnosis not present

## 2016-10-09 DIAGNOSIS — M6281 Muscle weakness (generalized): Secondary | ICD-10-CM | POA: Diagnosis not present

## 2016-10-09 DIAGNOSIS — R2689 Other abnormalities of gait and mobility: Secondary | ICD-10-CM | POA: Diagnosis not present

## 2016-10-09 DIAGNOSIS — M79652 Pain in left thigh: Secondary | ICD-10-CM | POA: Diagnosis not present

## 2016-10-09 DIAGNOSIS — R2681 Unsteadiness on feet: Secondary | ICD-10-CM | POA: Diagnosis not present

## 2016-10-09 DIAGNOSIS — M25652 Stiffness of left hip, not elsewhere classified: Secondary | ICD-10-CM | POA: Diagnosis not present

## 2016-10-09 DIAGNOSIS — Z471 Aftercare following joint replacement surgery: Secondary | ICD-10-CM | POA: Diagnosis not present

## 2016-10-14 DIAGNOSIS — M25652 Stiffness of left hip, not elsewhere classified: Secondary | ICD-10-CM | POA: Diagnosis not present

## 2016-10-14 DIAGNOSIS — R2681 Unsteadiness on feet: Secondary | ICD-10-CM | POA: Diagnosis not present

## 2016-10-14 DIAGNOSIS — Z471 Aftercare following joint replacement surgery: Secondary | ICD-10-CM | POA: Diagnosis not present

## 2016-10-14 DIAGNOSIS — M6281 Muscle weakness (generalized): Secondary | ICD-10-CM | POA: Diagnosis not present

## 2016-10-14 DIAGNOSIS — D0502 Lobular carcinoma in situ of left breast: Secondary | ICD-10-CM | POA: Diagnosis not present

## 2016-10-14 DIAGNOSIS — C50919 Malignant neoplasm of unspecified site of unspecified female breast: Secondary | ICD-10-CM | POA: Diagnosis not present

## 2016-10-14 DIAGNOSIS — R2689 Other abnormalities of gait and mobility: Secondary | ICD-10-CM | POA: Diagnosis not present

## 2016-10-14 DIAGNOSIS — M79652 Pain in left thigh: Secondary | ICD-10-CM | POA: Diagnosis not present

## 2016-10-16 DIAGNOSIS — D0502 Lobular carcinoma in situ of left breast: Secondary | ICD-10-CM | POA: Diagnosis not present

## 2016-10-16 DIAGNOSIS — C50919 Malignant neoplasm of unspecified site of unspecified female breast: Secondary | ICD-10-CM | POA: Diagnosis not present

## 2016-10-17 DIAGNOSIS — M6281 Muscle weakness (generalized): Secondary | ICD-10-CM | POA: Diagnosis not present

## 2016-10-17 DIAGNOSIS — R2689 Other abnormalities of gait and mobility: Secondary | ICD-10-CM | POA: Diagnosis not present

## 2016-10-17 DIAGNOSIS — R2681 Unsteadiness on feet: Secondary | ICD-10-CM | POA: Diagnosis not present

## 2016-10-17 DIAGNOSIS — Z471 Aftercare following joint replacement surgery: Secondary | ICD-10-CM | POA: Diagnosis not present

## 2016-10-17 DIAGNOSIS — M79652 Pain in left thigh: Secondary | ICD-10-CM | POA: Diagnosis not present

## 2016-10-17 DIAGNOSIS — M25652 Stiffness of left hip, not elsewhere classified: Secondary | ICD-10-CM | POA: Diagnosis not present

## 2016-10-21 DIAGNOSIS — M25652 Stiffness of left hip, not elsewhere classified: Secondary | ICD-10-CM | POA: Diagnosis not present

## 2016-10-21 DIAGNOSIS — R2689 Other abnormalities of gait and mobility: Secondary | ICD-10-CM | POA: Diagnosis not present

## 2016-10-21 DIAGNOSIS — M6281 Muscle weakness (generalized): Secondary | ICD-10-CM | POA: Diagnosis not present

## 2016-10-21 DIAGNOSIS — R2681 Unsteadiness on feet: Secondary | ICD-10-CM | POA: Diagnosis not present

## 2016-10-21 DIAGNOSIS — Z471 Aftercare following joint replacement surgery: Secondary | ICD-10-CM | POA: Diagnosis not present

## 2016-10-21 DIAGNOSIS — M79652 Pain in left thigh: Secondary | ICD-10-CM | POA: Diagnosis not present

## 2016-10-22 ENCOUNTER — Other Ambulatory Visit: Payer: Self-pay | Admitting: Surgery

## 2016-10-22 DIAGNOSIS — N63 Unspecified lump in unspecified breast: Secondary | ICD-10-CM

## 2016-10-23 DIAGNOSIS — M6281 Muscle weakness (generalized): Secondary | ICD-10-CM | POA: Diagnosis not present

## 2016-10-23 DIAGNOSIS — M79652 Pain in left thigh: Secondary | ICD-10-CM | POA: Diagnosis not present

## 2016-10-23 DIAGNOSIS — M25661 Stiffness of right knee, not elsewhere classified: Secondary | ICD-10-CM | POA: Diagnosis not present

## 2016-10-23 DIAGNOSIS — M25652 Stiffness of left hip, not elsewhere classified: Secondary | ICD-10-CM | POA: Diagnosis not present

## 2016-10-23 DIAGNOSIS — M25651 Stiffness of right hip, not elsewhere classified: Secondary | ICD-10-CM | POA: Diagnosis not present

## 2016-10-23 DIAGNOSIS — R2681 Unsteadiness on feet: Secondary | ICD-10-CM | POA: Diagnosis not present

## 2016-10-23 DIAGNOSIS — Z471 Aftercare following joint replacement surgery: Secondary | ICD-10-CM | POA: Diagnosis not present

## 2016-10-23 DIAGNOSIS — R2689 Other abnormalities of gait and mobility: Secondary | ICD-10-CM | POA: Diagnosis not present

## 2016-10-27 ENCOUNTER — Other Ambulatory Visit: Payer: Self-pay | Admitting: Surgery

## 2016-10-27 ENCOUNTER — Ambulatory Visit
Admission: RE | Admit: 2016-10-27 | Discharge: 2016-10-27 | Disposition: A | Discharge status: Home / Self Care | Payer: Medicare Other | Source: Ambulatory Visit | Attending: Surgery | Admitting: Surgery

## 2016-10-27 ENCOUNTER — Ambulatory Visit
Admission: RE | Admit: 2016-10-27 | Discharge: 2016-10-27 | Disposition: A | Discharge status: Home / Self Care | Payer: Medicare Other | Source: Ambulatory Visit | Attending: Orthopaedic Surgery | Admitting: Orthopaedic Surgery

## 2016-10-27 ENCOUNTER — Other Ambulatory Visit (INDEPENDENT_AMBULATORY_CARE_PROVIDER_SITE_OTHER): Payer: Self-pay | Admitting: Orthopaedic Surgery

## 2016-10-27 DIAGNOSIS — N6012 Diffuse cystic mastopathy of left breast: Secondary | ICD-10-CM | POA: Diagnosis not present

## 2016-10-27 DIAGNOSIS — N63 Unspecified lump in unspecified breast: Secondary | ICD-10-CM

## 2016-10-27 DIAGNOSIS — N6323 Unspecified lump in the left breast, lower outer quadrant: Secondary | ICD-10-CM | POA: Diagnosis not present

## 2016-10-27 DIAGNOSIS — C50512 Malignant neoplasm of lower-outer quadrant of left female breast: Secondary | ICD-10-CM | POA: Diagnosis not present

## 2016-10-27 DIAGNOSIS — N6489 Other specified disorders of breast: Secondary | ICD-10-CM | POA: Diagnosis not present

## 2016-10-27 DIAGNOSIS — N6321 Unspecified lump in the left breast, upper outer quadrant: Secondary | ICD-10-CM | POA: Diagnosis not present

## 2016-10-27 DIAGNOSIS — N6320 Unspecified lump in the left breast, unspecified quadrant: Secondary | ICD-10-CM

## 2016-10-27 HISTORY — PX: BREAST BIOPSY: SHX20

## 2016-10-27 MED ORDER — GADOBENATE DIMEGLUMINE 529 MG/ML IV SOLN
14.0000 mL | Freq: Once | INTRAVENOUS | Status: AC | PRN
  Administered 2016-10-27: 14 mL via INTRAVENOUS

## 2016-10-28 DIAGNOSIS — M6281 Muscle weakness (generalized): Secondary | ICD-10-CM | POA: Diagnosis not present

## 2016-10-28 DIAGNOSIS — M25652 Stiffness of left hip, not elsewhere classified: Secondary | ICD-10-CM | POA: Diagnosis not present

## 2016-10-28 DIAGNOSIS — R2689 Other abnormalities of gait and mobility: Secondary | ICD-10-CM | POA: Diagnosis not present

## 2016-10-28 DIAGNOSIS — R2681 Unsteadiness on feet: Secondary | ICD-10-CM | POA: Diagnosis not present

## 2016-10-28 DIAGNOSIS — Z471 Aftercare following joint replacement surgery: Secondary | ICD-10-CM | POA: Diagnosis not present

## 2016-10-28 DIAGNOSIS — M79652 Pain in left thigh: Secondary | ICD-10-CM | POA: Diagnosis not present

## 2016-10-30 DIAGNOSIS — R2681 Unsteadiness on feet: Secondary | ICD-10-CM | POA: Diagnosis not present

## 2016-10-30 DIAGNOSIS — R2689 Other abnormalities of gait and mobility: Secondary | ICD-10-CM | POA: Diagnosis not present

## 2016-10-30 DIAGNOSIS — M6281 Muscle weakness (generalized): Secondary | ICD-10-CM | POA: Diagnosis not present

## 2016-10-30 DIAGNOSIS — M25652 Stiffness of left hip, not elsewhere classified: Secondary | ICD-10-CM | POA: Diagnosis not present

## 2016-10-30 DIAGNOSIS — M79652 Pain in left thigh: Secondary | ICD-10-CM | POA: Diagnosis not present

## 2016-10-30 DIAGNOSIS — Z471 Aftercare following joint replacement surgery: Secondary | ICD-10-CM | POA: Diagnosis not present

## 2016-11-03 DIAGNOSIS — Z6825 Body mass index (BMI) 25.0-25.9, adult: Secondary | ICD-10-CM | POA: Diagnosis not present

## 2016-11-03 DIAGNOSIS — E663 Overweight: Secondary | ICD-10-CM | POA: Diagnosis not present

## 2016-11-03 DIAGNOSIS — F411 Generalized anxiety disorder: Secondary | ICD-10-CM | POA: Diagnosis not present

## 2016-11-03 DIAGNOSIS — C50919 Malignant neoplasm of unspecified site of unspecified female breast: Secondary | ICD-10-CM | POA: Diagnosis not present

## 2016-11-05 DIAGNOSIS — M6281 Muscle weakness (generalized): Secondary | ICD-10-CM | POA: Diagnosis not present

## 2016-11-05 DIAGNOSIS — R2681 Unsteadiness on feet: Secondary | ICD-10-CM | POA: Diagnosis not present

## 2016-11-05 DIAGNOSIS — R2689 Other abnormalities of gait and mobility: Secondary | ICD-10-CM | POA: Diagnosis not present

## 2016-11-05 DIAGNOSIS — M79652 Pain in left thigh: Secondary | ICD-10-CM | POA: Diagnosis not present

## 2016-11-05 DIAGNOSIS — Z471 Aftercare following joint replacement surgery: Secondary | ICD-10-CM | POA: Diagnosis not present

## 2016-11-05 DIAGNOSIS — M25652 Stiffness of left hip, not elsewhere classified: Secondary | ICD-10-CM | POA: Diagnosis not present

## 2016-11-07 DIAGNOSIS — M6281 Muscle weakness (generalized): Secondary | ICD-10-CM | POA: Diagnosis not present

## 2016-11-07 DIAGNOSIS — Z471 Aftercare following joint replacement surgery: Secondary | ICD-10-CM | POA: Diagnosis not present

## 2016-11-07 DIAGNOSIS — R2689 Other abnormalities of gait and mobility: Secondary | ICD-10-CM | POA: Diagnosis not present

## 2016-11-07 DIAGNOSIS — R2681 Unsteadiness on feet: Secondary | ICD-10-CM | POA: Diagnosis not present

## 2016-11-07 DIAGNOSIS — M79652 Pain in left thigh: Secondary | ICD-10-CM | POA: Diagnosis not present

## 2016-11-07 DIAGNOSIS — M25652 Stiffness of left hip, not elsewhere classified: Secondary | ICD-10-CM | POA: Diagnosis not present

## 2016-11-10 DIAGNOSIS — R269 Unspecified abnormalities of gait and mobility: Secondary | ICD-10-CM | POA: Diagnosis not present

## 2016-11-10 DIAGNOSIS — Z170 Estrogen receptor positive status [ER+]: Secondary | ICD-10-CM | POA: Diagnosis not present

## 2016-11-10 DIAGNOSIS — M810 Age-related osteoporosis without current pathological fracture: Secondary | ICD-10-CM | POA: Diagnosis not present

## 2016-11-10 DIAGNOSIS — R928 Other abnormal and inconclusive findings on diagnostic imaging of breast: Secondary | ICD-10-CM | POA: Diagnosis not present

## 2016-11-10 DIAGNOSIS — G629 Polyneuropathy, unspecified: Secondary | ICD-10-CM | POA: Diagnosis not present

## 2016-11-10 DIAGNOSIS — Z79899 Other long term (current) drug therapy: Secondary | ICD-10-CM | POA: Diagnosis not present

## 2016-11-10 DIAGNOSIS — C50412 Malignant neoplasm of upper-outer quadrant of left female breast: Secondary | ICD-10-CM | POA: Diagnosis not present

## 2016-11-10 DIAGNOSIS — G8929 Other chronic pain: Secondary | ICD-10-CM | POA: Diagnosis not present

## 2016-11-10 DIAGNOSIS — C50912 Malignant neoplasm of unspecified site of left female breast: Secondary | ICD-10-CM | POA: Diagnosis not present

## 2016-11-26 DIAGNOSIS — C50919 Malignant neoplasm of unspecified site of unspecified female breast: Secondary | ICD-10-CM | POA: Diagnosis not present

## 2016-11-28 DIAGNOSIS — Z170 Estrogen receptor positive status [ER+]: Secondary | ICD-10-CM | POA: Diagnosis not present

## 2016-11-28 DIAGNOSIS — C50412 Malignant neoplasm of upper-outer quadrant of left female breast: Secondary | ICD-10-CM | POA: Diagnosis not present

## 2016-12-03 DIAGNOSIS — C50919 Malignant neoplasm of unspecified site of unspecified female breast: Secondary | ICD-10-CM | POA: Diagnosis not present

## 2016-12-09 DIAGNOSIS — Z170 Estrogen receptor positive status [ER+]: Secondary | ICD-10-CM | POA: Diagnosis not present

## 2016-12-09 DIAGNOSIS — C50412 Malignant neoplasm of upper-outer quadrant of left female breast: Secondary | ICD-10-CM | POA: Diagnosis not present

## 2016-12-16 DIAGNOSIS — C50412 Malignant neoplasm of upper-outer quadrant of left female breast: Secondary | ICD-10-CM | POA: Diagnosis not present

## 2016-12-16 DIAGNOSIS — Z79811 Long term (current) use of aromatase inhibitors: Secondary | ICD-10-CM | POA: Diagnosis not present

## 2016-12-16 DIAGNOSIS — Z170 Estrogen receptor positive status [ER+]: Secondary | ICD-10-CM | POA: Diagnosis not present

## 2016-12-18 DIAGNOSIS — C50412 Malignant neoplasm of upper-outer quadrant of left female breast: Secondary | ICD-10-CM | POA: Diagnosis not present

## 2016-12-18 DIAGNOSIS — Z510 Encounter for antineoplastic radiation therapy: Secondary | ICD-10-CM | POA: Diagnosis not present

## 2016-12-18 DIAGNOSIS — Z23 Encounter for immunization: Secondary | ICD-10-CM | POA: Diagnosis not present

## 2016-12-22 DIAGNOSIS — C50412 Malignant neoplasm of upper-outer quadrant of left female breast: Secondary | ICD-10-CM | POA: Diagnosis not present

## 2016-12-22 DIAGNOSIS — Z510 Encounter for antineoplastic radiation therapy: Secondary | ICD-10-CM | POA: Diagnosis not present

## 2016-12-23 DIAGNOSIS — Z510 Encounter for antineoplastic radiation therapy: Secondary | ICD-10-CM | POA: Diagnosis not present

## 2016-12-23 DIAGNOSIS — C50412 Malignant neoplasm of upper-outer quadrant of left female breast: Secondary | ICD-10-CM | POA: Diagnosis not present

## 2016-12-24 DIAGNOSIS — Z510 Encounter for antineoplastic radiation therapy: Secondary | ICD-10-CM | POA: Diagnosis not present

## 2016-12-24 DIAGNOSIS — C50412 Malignant neoplasm of upper-outer quadrant of left female breast: Secondary | ICD-10-CM | POA: Diagnosis not present

## 2016-12-25 DIAGNOSIS — C50412 Malignant neoplasm of upper-outer quadrant of left female breast: Secondary | ICD-10-CM | POA: Diagnosis not present

## 2016-12-25 DIAGNOSIS — Z510 Encounter for antineoplastic radiation therapy: Secondary | ICD-10-CM | POA: Diagnosis not present

## 2016-12-26 DIAGNOSIS — Z510 Encounter for antineoplastic radiation therapy: Secondary | ICD-10-CM | POA: Diagnosis not present

## 2016-12-26 DIAGNOSIS — C50412 Malignant neoplasm of upper-outer quadrant of left female breast: Secondary | ICD-10-CM | POA: Diagnosis not present

## 2016-12-29 DIAGNOSIS — C50412 Malignant neoplasm of upper-outer quadrant of left female breast: Secondary | ICD-10-CM | POA: Diagnosis not present

## 2016-12-29 DIAGNOSIS — Z510 Encounter for antineoplastic radiation therapy: Secondary | ICD-10-CM | POA: Diagnosis not present

## 2016-12-30 DIAGNOSIS — C50412 Malignant neoplasm of upper-outer quadrant of left female breast: Secondary | ICD-10-CM | POA: Diagnosis not present

## 2016-12-30 DIAGNOSIS — Z510 Encounter for antineoplastic radiation therapy: Secondary | ICD-10-CM | POA: Diagnosis not present

## 2016-12-31 DIAGNOSIS — Z510 Encounter for antineoplastic radiation therapy: Secondary | ICD-10-CM | POA: Diagnosis not present

## 2016-12-31 DIAGNOSIS — C50412 Malignant neoplasm of upper-outer quadrant of left female breast: Secondary | ICD-10-CM | POA: Diagnosis not present

## 2017-01-01 DIAGNOSIS — C50412 Malignant neoplasm of upper-outer quadrant of left female breast: Secondary | ICD-10-CM | POA: Diagnosis not present

## 2017-01-01 DIAGNOSIS — Z510 Encounter for antineoplastic radiation therapy: Secondary | ICD-10-CM | POA: Diagnosis not present

## 2017-01-02 DIAGNOSIS — Z510 Encounter for antineoplastic radiation therapy: Secondary | ICD-10-CM | POA: Diagnosis not present

## 2017-01-02 DIAGNOSIS — C50412 Malignant neoplasm of upper-outer quadrant of left female breast: Secondary | ICD-10-CM | POA: Diagnosis not present

## 2017-01-05 DIAGNOSIS — Z79899 Other long term (current) drug therapy: Secondary | ICD-10-CM | POA: Diagnosis not present

## 2017-01-05 DIAGNOSIS — C50919 Malignant neoplasm of unspecified site of unspecified female breast: Secondary | ICD-10-CM | POA: Diagnosis not present

## 2017-01-05 DIAGNOSIS — Z6825 Body mass index (BMI) 25.0-25.9, adult: Secondary | ICD-10-CM | POA: Diagnosis not present

## 2017-01-05 DIAGNOSIS — C50412 Malignant neoplasm of upper-outer quadrant of left female breast: Secondary | ICD-10-CM | POA: Diagnosis not present

## 2017-01-05 DIAGNOSIS — F411 Generalized anxiety disorder: Secondary | ICD-10-CM | POA: Diagnosis not present

## 2017-01-05 DIAGNOSIS — R609 Edema, unspecified: Secondary | ICD-10-CM | POA: Diagnosis not present

## 2017-01-05 DIAGNOSIS — Z510 Encounter for antineoplastic radiation therapy: Secondary | ICD-10-CM | POA: Diagnosis not present

## 2017-01-06 DIAGNOSIS — Z510 Encounter for antineoplastic radiation therapy: Secondary | ICD-10-CM | POA: Diagnosis not present

## 2017-01-06 DIAGNOSIS — C50412 Malignant neoplasm of upper-outer quadrant of left female breast: Secondary | ICD-10-CM | POA: Diagnosis not present

## 2017-01-07 DIAGNOSIS — C50412 Malignant neoplasm of upper-outer quadrant of left female breast: Secondary | ICD-10-CM | POA: Diagnosis not present

## 2017-01-07 DIAGNOSIS — C50919 Malignant neoplasm of unspecified site of unspecified female breast: Secondary | ICD-10-CM | POA: Diagnosis not present

## 2017-01-07 DIAGNOSIS — Z510 Encounter for antineoplastic radiation therapy: Secondary | ICD-10-CM | POA: Diagnosis not present

## 2017-01-08 DIAGNOSIS — Z510 Encounter for antineoplastic radiation therapy: Secondary | ICD-10-CM | POA: Diagnosis not present

## 2017-01-08 DIAGNOSIS — C50412 Malignant neoplasm of upper-outer quadrant of left female breast: Secondary | ICD-10-CM | POA: Diagnosis not present

## 2017-01-09 DIAGNOSIS — C50412 Malignant neoplasm of upper-outer quadrant of left female breast: Secondary | ICD-10-CM | POA: Diagnosis not present

## 2017-01-09 DIAGNOSIS — Z510 Encounter for antineoplastic radiation therapy: Secondary | ICD-10-CM | POA: Diagnosis not present

## 2017-01-12 DIAGNOSIS — Z510 Encounter for antineoplastic radiation therapy: Secondary | ICD-10-CM | POA: Diagnosis not present

## 2017-01-12 DIAGNOSIS — C50412 Malignant neoplasm of upper-outer quadrant of left female breast: Secondary | ICD-10-CM | POA: Diagnosis not present

## 2017-01-13 DIAGNOSIS — Z510 Encounter for antineoplastic radiation therapy: Secondary | ICD-10-CM | POA: Diagnosis not present

## 2017-01-13 DIAGNOSIS — C50412 Malignant neoplasm of upper-outer quadrant of left female breast: Secondary | ICD-10-CM | POA: Diagnosis not present

## 2017-01-14 DIAGNOSIS — C50412 Malignant neoplasm of upper-outer quadrant of left female breast: Secondary | ICD-10-CM | POA: Diagnosis not present

## 2017-01-14 DIAGNOSIS — Z510 Encounter for antineoplastic radiation therapy: Secondary | ICD-10-CM | POA: Diagnosis not present

## 2017-01-15 DIAGNOSIS — C50412 Malignant neoplasm of upper-outer quadrant of left female breast: Secondary | ICD-10-CM | POA: Diagnosis not present

## 2017-01-15 DIAGNOSIS — Z510 Encounter for antineoplastic radiation therapy: Secondary | ICD-10-CM | POA: Diagnosis not present

## 2017-01-16 DIAGNOSIS — C50412 Malignant neoplasm of upper-outer quadrant of left female breast: Secondary | ICD-10-CM | POA: Diagnosis not present

## 2017-01-16 DIAGNOSIS — Z510 Encounter for antineoplastic radiation therapy: Secondary | ICD-10-CM | POA: Diagnosis not present

## 2017-01-19 DIAGNOSIS — C50412 Malignant neoplasm of upper-outer quadrant of left female breast: Secondary | ICD-10-CM | POA: Diagnosis not present

## 2017-01-19 DIAGNOSIS — Z510 Encounter for antineoplastic radiation therapy: Secondary | ICD-10-CM | POA: Diagnosis not present

## 2017-01-20 DIAGNOSIS — Z510 Encounter for antineoplastic radiation therapy: Secondary | ICD-10-CM | POA: Diagnosis not present

## 2017-01-20 DIAGNOSIS — C50412 Malignant neoplasm of upper-outer quadrant of left female breast: Secondary | ICD-10-CM | POA: Diagnosis not present

## 2017-01-21 DIAGNOSIS — Z510 Encounter for antineoplastic radiation therapy: Secondary | ICD-10-CM | POA: Diagnosis not present

## 2017-01-21 DIAGNOSIS — C50412 Malignant neoplasm of upper-outer quadrant of left female breast: Secondary | ICD-10-CM | POA: Diagnosis not present

## 2017-01-22 DIAGNOSIS — Z510 Encounter for antineoplastic radiation therapy: Secondary | ICD-10-CM | POA: Diagnosis not present

## 2017-01-22 DIAGNOSIS — C50412 Malignant neoplasm of upper-outer quadrant of left female breast: Secondary | ICD-10-CM | POA: Diagnosis not present

## 2017-01-23 DIAGNOSIS — Z510 Encounter for antineoplastic radiation therapy: Secondary | ICD-10-CM | POA: Diagnosis not present

## 2017-01-23 DIAGNOSIS — C50412 Malignant neoplasm of upper-outer quadrant of left female breast: Secondary | ICD-10-CM | POA: Diagnosis not present

## 2017-01-26 DIAGNOSIS — Z510 Encounter for antineoplastic radiation therapy: Secondary | ICD-10-CM | POA: Diagnosis not present

## 2017-01-26 DIAGNOSIS — C50412 Malignant neoplasm of upper-outer quadrant of left female breast: Secondary | ICD-10-CM | POA: Diagnosis not present

## 2017-01-27 DIAGNOSIS — Z510 Encounter for antineoplastic radiation therapy: Secondary | ICD-10-CM | POA: Diagnosis not present

## 2017-01-27 DIAGNOSIS — C50412 Malignant neoplasm of upper-outer quadrant of left female breast: Secondary | ICD-10-CM | POA: Diagnosis not present

## 2017-01-28 DIAGNOSIS — Z510 Encounter for antineoplastic radiation therapy: Secondary | ICD-10-CM | POA: Diagnosis not present

## 2017-01-28 DIAGNOSIS — C50412 Malignant neoplasm of upper-outer quadrant of left female breast: Secondary | ICD-10-CM | POA: Diagnosis not present

## 2017-01-29 DIAGNOSIS — Z510 Encounter for antineoplastic radiation therapy: Secondary | ICD-10-CM | POA: Diagnosis not present

## 2017-01-29 DIAGNOSIS — C50412 Malignant neoplasm of upper-outer quadrant of left female breast: Secondary | ICD-10-CM | POA: Diagnosis not present

## 2017-01-30 DIAGNOSIS — Z510 Encounter for antineoplastic radiation therapy: Secondary | ICD-10-CM | POA: Diagnosis not present

## 2017-01-30 DIAGNOSIS — C50412 Malignant neoplasm of upper-outer quadrant of left female breast: Secondary | ICD-10-CM | POA: Diagnosis not present

## 2017-02-02 DIAGNOSIS — M961 Postlaminectomy syndrome, not elsewhere classified: Secondary | ICD-10-CM | POA: Diagnosis not present

## 2017-02-02 DIAGNOSIS — Z79891 Long term (current) use of opiate analgesic: Secondary | ICD-10-CM | POA: Diagnosis not present

## 2017-02-03 DIAGNOSIS — Z510 Encounter for antineoplastic radiation therapy: Secondary | ICD-10-CM | POA: Diagnosis not present

## 2017-02-03 DIAGNOSIS — C50412 Malignant neoplasm of upper-outer quadrant of left female breast: Secondary | ICD-10-CM | POA: Diagnosis not present

## 2017-03-09 DIAGNOSIS — K219 Gastro-esophageal reflux disease without esophagitis: Secondary | ICD-10-CM | POA: Diagnosis not present

## 2017-03-09 DIAGNOSIS — L989 Disorder of the skin and subcutaneous tissue, unspecified: Secondary | ICD-10-CM | POA: Diagnosis not present

## 2017-03-09 DIAGNOSIS — K5909 Other constipation: Secondary | ICD-10-CM | POA: Diagnosis not present

## 2017-03-09 DIAGNOSIS — G4700 Insomnia, unspecified: Secondary | ICD-10-CM | POA: Diagnosis not present

## 2017-03-09 DIAGNOSIS — F411 Generalized anxiety disorder: Secondary | ICD-10-CM | POA: Diagnosis not present

## 2017-03-09 DIAGNOSIS — Z6825 Body mass index (BMI) 25.0-25.9, adult: Secondary | ICD-10-CM | POA: Diagnosis not present

## 2017-03-11 DIAGNOSIS — C50412 Malignant neoplasm of upper-outer quadrant of left female breast: Secondary | ICD-10-CM | POA: Diagnosis not present

## 2017-04-01 DIAGNOSIS — Z96642 Presence of left artificial hip joint: Secondary | ICD-10-CM | POA: Diagnosis not present

## 2017-04-01 DIAGNOSIS — M25552 Pain in left hip: Secondary | ICD-10-CM | POA: Diagnosis not present

## 2017-04-07 DIAGNOSIS — C50919 Malignant neoplasm of unspecified site of unspecified female breast: Secondary | ICD-10-CM | POA: Diagnosis not present

## 2017-04-17 DIAGNOSIS — Z79811 Long term (current) use of aromatase inhibitors: Secondary | ICD-10-CM | POA: Diagnosis not present

## 2017-04-17 DIAGNOSIS — Z853 Personal history of malignant neoplasm of breast: Secondary | ICD-10-CM | POA: Diagnosis not present

## 2017-04-17 DIAGNOSIS — C50412 Malignant neoplasm of upper-outer quadrant of left female breast: Secondary | ICD-10-CM | POA: Diagnosis not present

## 2017-04-28 DIAGNOSIS — L578 Other skin changes due to chronic exposure to nonionizing radiation: Secondary | ICD-10-CM | POA: Diagnosis not present

## 2017-04-28 DIAGNOSIS — L570 Actinic keratosis: Secondary | ICD-10-CM | POA: Diagnosis not present

## 2017-04-28 DIAGNOSIS — L814 Other melanin hyperpigmentation: Secondary | ICD-10-CM | POA: Diagnosis not present

## 2017-05-06 DIAGNOSIS — F411 Generalized anxiety disorder: Secondary | ICD-10-CM | POA: Diagnosis not present

## 2017-05-06 DIAGNOSIS — E663 Overweight: Secondary | ICD-10-CM | POA: Diagnosis not present

## 2017-05-06 DIAGNOSIS — R05 Cough: Secondary | ICD-10-CM | POA: Diagnosis not present

## 2017-05-06 DIAGNOSIS — G609 Hereditary and idiopathic neuropathy, unspecified: Secondary | ICD-10-CM | POA: Diagnosis not present

## 2017-05-06 DIAGNOSIS — J069 Acute upper respiratory infection, unspecified: Secondary | ICD-10-CM | POA: Diagnosis not present

## 2017-05-06 DIAGNOSIS — Z6824 Body mass index (BMI) 24.0-24.9, adult: Secondary | ICD-10-CM | POA: Diagnosis not present

## 2017-05-11 DIAGNOSIS — M2012 Hallux valgus (acquired), left foot: Secondary | ICD-10-CM | POA: Diagnosis not present

## 2017-05-11 DIAGNOSIS — M25571 Pain in right ankle and joints of right foot: Secondary | ICD-10-CM | POA: Diagnosis not present

## 2017-05-11 DIAGNOSIS — M79672 Pain in left foot: Secondary | ICD-10-CM | POA: Diagnosis not present

## 2017-05-11 DIAGNOSIS — L89899 Pressure ulcer of other site, unspecified stage: Secondary | ICD-10-CM | POA: Diagnosis not present

## 2017-05-12 DIAGNOSIS — L97309 Non-pressure chronic ulcer of unspecified ankle with unspecified severity: Secondary | ICD-10-CM | POA: Diagnosis not present

## 2017-05-12 DIAGNOSIS — Z6824 Body mass index (BMI) 24.0-24.9, adult: Secondary | ICD-10-CM | POA: Diagnosis not present

## 2017-05-12 DIAGNOSIS — K589 Irritable bowel syndrome without diarrhea: Secondary | ICD-10-CM | POA: Diagnosis not present

## 2017-05-12 DIAGNOSIS — K219 Gastro-esophageal reflux disease without esophagitis: Secondary | ICD-10-CM | POA: Diagnosis not present

## 2017-05-12 DIAGNOSIS — F411 Generalized anxiety disorder: Secondary | ICD-10-CM | POA: Diagnosis not present

## 2017-05-19 DIAGNOSIS — L97318 Non-pressure chronic ulcer of right ankle with other specified severity: Secondary | ICD-10-CM | POA: Diagnosis not present

## 2017-05-20 DIAGNOSIS — M21612 Bunion of left foot: Secondary | ICD-10-CM | POA: Diagnosis not present

## 2017-05-20 DIAGNOSIS — R29898 Other symptoms and signs involving the musculoskeletal system: Secondary | ICD-10-CM | POA: Diagnosis not present

## 2017-05-20 DIAGNOSIS — G609 Hereditary and idiopathic neuropathy, unspecified: Secondary | ICD-10-CM | POA: Diagnosis not present

## 2017-05-20 DIAGNOSIS — Z6824 Body mass index (BMI) 24.0-24.9, adult: Secondary | ICD-10-CM | POA: Diagnosis not present

## 2017-05-27 DIAGNOSIS — M2012 Hallux valgus (acquired), left foot: Secondary | ICD-10-CM | POA: Diagnosis not present

## 2017-05-27 DIAGNOSIS — M21612 Bunion of left foot: Secondary | ICD-10-CM | POA: Diagnosis not present

## 2017-05-27 DIAGNOSIS — M79672 Pain in left foot: Secondary | ICD-10-CM | POA: Diagnosis not present

## 2017-06-02 DIAGNOSIS — T50904A Poisoning by unspecified drugs, medicaments and biological substances, undetermined, initial encounter: Secondary | ICD-10-CM | POA: Diagnosis not present

## 2017-06-02 DIAGNOSIS — M545 Low back pain: Secondary | ICD-10-CM | POA: Diagnosis not present

## 2017-06-02 DIAGNOSIS — M961 Postlaminectomy syndrome, not elsewhere classified: Secondary | ICD-10-CM | POA: Diagnosis not present

## 2017-06-02 DIAGNOSIS — G894 Chronic pain syndrome: Secondary | ICD-10-CM | POA: Diagnosis not present

## 2017-06-08 DIAGNOSIS — M2012 Hallux valgus (acquired), left foot: Secondary | ICD-10-CM | POA: Diagnosis not present

## 2017-06-16 DIAGNOSIS — G8918 Other acute postprocedural pain: Secondary | ICD-10-CM | POA: Diagnosis not present

## 2017-06-16 DIAGNOSIS — M21612 Bunion of left foot: Secondary | ICD-10-CM | POA: Diagnosis not present

## 2017-08-18 DIAGNOSIS — Z923 Personal history of irradiation: Secondary | ICD-10-CM | POA: Diagnosis not present

## 2017-08-18 DIAGNOSIS — Z79811 Long term (current) use of aromatase inhibitors: Secondary | ICD-10-CM | POA: Diagnosis not present

## 2017-08-18 DIAGNOSIS — Z170 Estrogen receptor positive status [ER+]: Secondary | ICD-10-CM | POA: Diagnosis not present

## 2017-08-18 DIAGNOSIS — C50412 Malignant neoplasm of upper-outer quadrant of left female breast: Secondary | ICD-10-CM | POA: Diagnosis not present

## 2017-10-01 DIAGNOSIS — K589 Irritable bowel syndrome without diarrhea: Secondary | ICD-10-CM | POA: Diagnosis not present

## 2017-10-01 DIAGNOSIS — Z79899 Other long term (current) drug therapy: Secondary | ICD-10-CM | POA: Diagnosis not present

## 2017-10-02 DIAGNOSIS — M79672 Pain in left foot: Secondary | ICD-10-CM | POA: Diagnosis not present

## 2017-10-02 DIAGNOSIS — Z09 Encounter for follow-up examination after completed treatment for conditions other than malignant neoplasm: Secondary | ICD-10-CM | POA: Diagnosis not present

## 2017-10-02 DIAGNOSIS — M21612 Bunion of left foot: Secondary | ICD-10-CM | POA: Diagnosis not present

## 2017-10-07 DIAGNOSIS — M25675 Stiffness of left foot, not elsewhere classified: Secondary | ICD-10-CM | POA: Diagnosis not present

## 2017-10-07 DIAGNOSIS — R2689 Other abnormalities of gait and mobility: Secondary | ICD-10-CM | POA: Diagnosis not present

## 2017-10-07 DIAGNOSIS — M25672 Stiffness of left ankle, not elsewhere classified: Secondary | ICD-10-CM | POA: Diagnosis not present

## 2017-10-07 DIAGNOSIS — M21612 Bunion of left foot: Secondary | ICD-10-CM | POA: Diagnosis not present

## 2017-10-07 DIAGNOSIS — M25572 Pain in left ankle and joints of left foot: Secondary | ICD-10-CM | POA: Diagnosis not present

## 2017-10-07 DIAGNOSIS — R2681 Unsteadiness on feet: Secondary | ICD-10-CM | POA: Diagnosis not present

## 2017-10-07 DIAGNOSIS — M25475 Effusion, left foot: Secondary | ICD-10-CM | POA: Diagnosis not present

## 2017-10-07 DIAGNOSIS — M25671 Stiffness of right ankle, not elsewhere classified: Secondary | ICD-10-CM | POA: Diagnosis not present

## 2017-10-07 DIAGNOSIS — M6281 Muscle weakness (generalized): Secondary | ICD-10-CM | POA: Diagnosis not present

## 2017-10-07 DIAGNOSIS — M25472 Effusion, left ankle: Secondary | ICD-10-CM | POA: Diagnosis not present

## 2017-10-09 DIAGNOSIS — M25475 Effusion, left foot: Secondary | ICD-10-CM | POA: Diagnosis not present

## 2017-10-09 DIAGNOSIS — M25672 Stiffness of left ankle, not elsewhere classified: Secondary | ICD-10-CM | POA: Diagnosis not present

## 2017-10-09 DIAGNOSIS — R2689 Other abnormalities of gait and mobility: Secondary | ICD-10-CM | POA: Diagnosis not present

## 2017-10-09 DIAGNOSIS — M6281 Muscle weakness (generalized): Secondary | ICD-10-CM | POA: Diagnosis not present

## 2017-10-09 DIAGNOSIS — M25671 Stiffness of right ankle, not elsewhere classified: Secondary | ICD-10-CM | POA: Diagnosis not present

## 2017-10-09 DIAGNOSIS — M25675 Stiffness of left foot, not elsewhere classified: Secondary | ICD-10-CM | POA: Diagnosis not present

## 2017-10-09 DIAGNOSIS — M25572 Pain in left ankle and joints of left foot: Secondary | ICD-10-CM | POA: Diagnosis not present

## 2017-10-09 DIAGNOSIS — M21612 Bunion of left foot: Secondary | ICD-10-CM | POA: Diagnosis not present

## 2017-10-09 DIAGNOSIS — M25472 Effusion, left ankle: Secondary | ICD-10-CM | POA: Diagnosis not present

## 2017-10-09 DIAGNOSIS — R2681 Unsteadiness on feet: Secondary | ICD-10-CM | POA: Diagnosis not present

## 2017-10-14 DIAGNOSIS — M25475 Effusion, left foot: Secondary | ICD-10-CM | POA: Diagnosis not present

## 2017-10-14 DIAGNOSIS — M21612 Bunion of left foot: Secondary | ICD-10-CM | POA: Diagnosis not present

## 2017-10-14 DIAGNOSIS — M6281 Muscle weakness (generalized): Secondary | ICD-10-CM | POA: Diagnosis not present

## 2017-10-14 DIAGNOSIS — M25572 Pain in left ankle and joints of left foot: Secondary | ICD-10-CM | POA: Diagnosis not present

## 2017-10-14 DIAGNOSIS — R2689 Other abnormalities of gait and mobility: Secondary | ICD-10-CM | POA: Diagnosis not present

## 2017-10-14 DIAGNOSIS — M25675 Stiffness of left foot, not elsewhere classified: Secondary | ICD-10-CM | POA: Diagnosis not present

## 2017-10-14 DIAGNOSIS — R2681 Unsteadiness on feet: Secondary | ICD-10-CM | POA: Diagnosis not present

## 2017-10-14 DIAGNOSIS — M25472 Effusion, left ankle: Secondary | ICD-10-CM | POA: Diagnosis not present

## 2017-10-14 DIAGNOSIS — M25671 Stiffness of right ankle, not elsewhere classified: Secondary | ICD-10-CM | POA: Diagnosis not present

## 2017-10-14 DIAGNOSIS — M25672 Stiffness of left ankle, not elsewhere classified: Secondary | ICD-10-CM | POA: Diagnosis not present

## 2017-10-16 DIAGNOSIS — M21612 Bunion of left foot: Secondary | ICD-10-CM | POA: Diagnosis not present

## 2017-10-16 DIAGNOSIS — M25472 Effusion, left ankle: Secondary | ICD-10-CM | POA: Diagnosis not present

## 2017-10-16 DIAGNOSIS — M25675 Stiffness of left foot, not elsewhere classified: Secondary | ICD-10-CM | POA: Diagnosis not present

## 2017-10-16 DIAGNOSIS — R2681 Unsteadiness on feet: Secondary | ICD-10-CM | POA: Diagnosis not present

## 2017-10-16 DIAGNOSIS — M25572 Pain in left ankle and joints of left foot: Secondary | ICD-10-CM | POA: Diagnosis not present

## 2017-10-16 DIAGNOSIS — M25671 Stiffness of right ankle, not elsewhere classified: Secondary | ICD-10-CM | POA: Diagnosis not present

## 2017-10-16 DIAGNOSIS — M25475 Effusion, left foot: Secondary | ICD-10-CM | POA: Diagnosis not present

## 2017-10-16 DIAGNOSIS — R2689 Other abnormalities of gait and mobility: Secondary | ICD-10-CM | POA: Diagnosis not present

## 2017-10-16 DIAGNOSIS — M25672 Stiffness of left ankle, not elsewhere classified: Secondary | ICD-10-CM | POA: Diagnosis not present

## 2017-10-16 DIAGNOSIS — M6281 Muscle weakness (generalized): Secondary | ICD-10-CM | POA: Diagnosis not present

## 2017-10-21 DIAGNOSIS — R2689 Other abnormalities of gait and mobility: Secondary | ICD-10-CM | POA: Diagnosis not present

## 2017-10-21 DIAGNOSIS — M6281 Muscle weakness (generalized): Secondary | ICD-10-CM | POA: Diagnosis not present

## 2017-10-21 DIAGNOSIS — M21612 Bunion of left foot: Secondary | ICD-10-CM | POA: Diagnosis not present

## 2017-10-21 DIAGNOSIS — M25572 Pain in left ankle and joints of left foot: Secondary | ICD-10-CM | POA: Diagnosis not present

## 2017-10-21 DIAGNOSIS — M25475 Effusion, left foot: Secondary | ICD-10-CM | POA: Diagnosis not present

## 2017-10-21 DIAGNOSIS — M25472 Effusion, left ankle: Secondary | ICD-10-CM | POA: Diagnosis not present

## 2017-10-21 DIAGNOSIS — M25675 Stiffness of left foot, not elsewhere classified: Secondary | ICD-10-CM | POA: Diagnosis not present

## 2017-10-21 DIAGNOSIS — M25672 Stiffness of left ankle, not elsewhere classified: Secondary | ICD-10-CM | POA: Diagnosis not present

## 2017-10-21 DIAGNOSIS — M25671 Stiffness of right ankle, not elsewhere classified: Secondary | ICD-10-CM | POA: Diagnosis not present

## 2017-10-21 DIAGNOSIS — R2681 Unsteadiness on feet: Secondary | ICD-10-CM | POA: Diagnosis not present

## 2017-10-23 DIAGNOSIS — M6281 Muscle weakness (generalized): Secondary | ICD-10-CM | POA: Diagnosis not present

## 2017-10-23 DIAGNOSIS — M25475 Effusion, left foot: Secondary | ICD-10-CM | POA: Diagnosis not present

## 2017-10-23 DIAGNOSIS — M25672 Stiffness of left ankle, not elsewhere classified: Secondary | ICD-10-CM | POA: Diagnosis not present

## 2017-10-23 DIAGNOSIS — R2689 Other abnormalities of gait and mobility: Secondary | ICD-10-CM | POA: Diagnosis not present

## 2017-10-23 DIAGNOSIS — M21612 Bunion of left foot: Secondary | ICD-10-CM | POA: Diagnosis not present

## 2017-10-23 DIAGNOSIS — M25671 Stiffness of right ankle, not elsewhere classified: Secondary | ICD-10-CM | POA: Diagnosis not present

## 2017-10-23 DIAGNOSIS — M25472 Effusion, left ankle: Secondary | ICD-10-CM | POA: Diagnosis not present

## 2017-10-23 DIAGNOSIS — M25572 Pain in left ankle and joints of left foot: Secondary | ICD-10-CM | POA: Diagnosis not present

## 2017-10-23 DIAGNOSIS — R2681 Unsteadiness on feet: Secondary | ICD-10-CM | POA: Diagnosis not present

## 2017-10-23 DIAGNOSIS — M25675 Stiffness of left foot, not elsewhere classified: Secondary | ICD-10-CM | POA: Diagnosis not present

## 2017-10-28 DIAGNOSIS — M25475 Effusion, left foot: Secondary | ICD-10-CM | POA: Diagnosis not present

## 2017-10-28 DIAGNOSIS — M25472 Effusion, left ankle: Secondary | ICD-10-CM | POA: Diagnosis not present

## 2017-10-28 DIAGNOSIS — M25671 Stiffness of right ankle, not elsewhere classified: Secondary | ICD-10-CM | POA: Diagnosis not present

## 2017-10-28 DIAGNOSIS — R2681 Unsteadiness on feet: Secondary | ICD-10-CM | POA: Diagnosis not present

## 2017-10-28 DIAGNOSIS — R2689 Other abnormalities of gait and mobility: Secondary | ICD-10-CM | POA: Diagnosis not present

## 2017-10-28 DIAGNOSIS — M25675 Stiffness of left foot, not elsewhere classified: Secondary | ICD-10-CM | POA: Diagnosis not present

## 2017-10-28 DIAGNOSIS — M21612 Bunion of left foot: Secondary | ICD-10-CM | POA: Diagnosis not present

## 2017-10-28 DIAGNOSIS — M6281 Muscle weakness (generalized): Secondary | ICD-10-CM | POA: Diagnosis not present

## 2017-10-28 DIAGNOSIS — M25572 Pain in left ankle and joints of left foot: Secondary | ICD-10-CM | POA: Diagnosis not present

## 2017-10-28 DIAGNOSIS — M25672 Stiffness of left ankle, not elsewhere classified: Secondary | ICD-10-CM | POA: Diagnosis not present

## 2017-10-30 DIAGNOSIS — M6281 Muscle weakness (generalized): Secondary | ICD-10-CM | POA: Diagnosis not present

## 2017-10-30 DIAGNOSIS — M21612 Bunion of left foot: Secondary | ICD-10-CM | POA: Diagnosis not present

## 2017-10-30 DIAGNOSIS — R2689 Other abnormalities of gait and mobility: Secondary | ICD-10-CM | POA: Diagnosis not present

## 2017-10-30 DIAGNOSIS — M25671 Stiffness of right ankle, not elsewhere classified: Secondary | ICD-10-CM | POA: Diagnosis not present

## 2017-10-30 DIAGNOSIS — R2681 Unsteadiness on feet: Secondary | ICD-10-CM | POA: Diagnosis not present

## 2017-10-30 DIAGNOSIS — M25672 Stiffness of left ankle, not elsewhere classified: Secondary | ICD-10-CM | POA: Diagnosis not present

## 2017-10-30 DIAGNOSIS — M25475 Effusion, left foot: Secondary | ICD-10-CM | POA: Diagnosis not present

## 2017-10-30 DIAGNOSIS — M25675 Stiffness of left foot, not elsewhere classified: Secondary | ICD-10-CM | POA: Diagnosis not present

## 2017-10-30 DIAGNOSIS — M25572 Pain in left ankle and joints of left foot: Secondary | ICD-10-CM | POA: Diagnosis not present

## 2017-10-30 DIAGNOSIS — M25472 Effusion, left ankle: Secondary | ICD-10-CM | POA: Diagnosis not present

## 2017-11-02 DIAGNOSIS — E785 Hyperlipidemia, unspecified: Secondary | ICD-10-CM | POA: Diagnosis not present

## 2017-11-03 DIAGNOSIS — M25671 Stiffness of right ankle, not elsewhere classified: Secondary | ICD-10-CM | POA: Diagnosis not present

## 2017-11-03 DIAGNOSIS — M21612 Bunion of left foot: Secondary | ICD-10-CM | POA: Diagnosis not present

## 2017-11-03 DIAGNOSIS — M25572 Pain in left ankle and joints of left foot: Secondary | ICD-10-CM | POA: Diagnosis not present

## 2017-11-03 DIAGNOSIS — R2689 Other abnormalities of gait and mobility: Secondary | ICD-10-CM | POA: Diagnosis not present

## 2017-11-03 DIAGNOSIS — M25675 Stiffness of left foot, not elsewhere classified: Secondary | ICD-10-CM | POA: Diagnosis not present

## 2017-11-03 DIAGNOSIS — M25672 Stiffness of left ankle, not elsewhere classified: Secondary | ICD-10-CM | POA: Diagnosis not present

## 2017-11-03 DIAGNOSIS — M25472 Effusion, left ankle: Secondary | ICD-10-CM | POA: Diagnosis not present

## 2017-11-03 DIAGNOSIS — R2681 Unsteadiness on feet: Secondary | ICD-10-CM | POA: Diagnosis not present

## 2017-11-03 DIAGNOSIS — M6281 Muscle weakness (generalized): Secondary | ICD-10-CM | POA: Diagnosis not present

## 2017-11-03 DIAGNOSIS — M25475 Effusion, left foot: Secondary | ICD-10-CM | POA: Diagnosis not present

## 2017-11-05 DIAGNOSIS — M25671 Stiffness of right ankle, not elsewhere classified: Secondary | ICD-10-CM | POA: Diagnosis not present

## 2017-11-05 DIAGNOSIS — M25572 Pain in left ankle and joints of left foot: Secondary | ICD-10-CM | POA: Diagnosis not present

## 2017-11-05 DIAGNOSIS — M21612 Bunion of left foot: Secondary | ICD-10-CM | POA: Diagnosis not present

## 2017-11-05 DIAGNOSIS — M25675 Stiffness of left foot, not elsewhere classified: Secondary | ICD-10-CM | POA: Diagnosis not present

## 2017-11-05 DIAGNOSIS — R2681 Unsteadiness on feet: Secondary | ICD-10-CM | POA: Diagnosis not present

## 2017-11-05 DIAGNOSIS — M25672 Stiffness of left ankle, not elsewhere classified: Secondary | ICD-10-CM | POA: Diagnosis not present

## 2017-11-05 DIAGNOSIS — M25475 Effusion, left foot: Secondary | ICD-10-CM | POA: Diagnosis not present

## 2017-11-05 DIAGNOSIS — R2689 Other abnormalities of gait and mobility: Secondary | ICD-10-CM | POA: Diagnosis not present

## 2017-11-05 DIAGNOSIS — M6281 Muscle weakness (generalized): Secondary | ICD-10-CM | POA: Diagnosis not present

## 2017-11-05 DIAGNOSIS — M25472 Effusion, left ankle: Secondary | ICD-10-CM | POA: Diagnosis not present

## 2017-11-11 DIAGNOSIS — M6281 Muscle weakness (generalized): Secondary | ICD-10-CM | POA: Diagnosis not present

## 2017-11-11 DIAGNOSIS — M25572 Pain in left ankle and joints of left foot: Secondary | ICD-10-CM | POA: Diagnosis not present

## 2017-11-11 DIAGNOSIS — M25672 Stiffness of left ankle, not elsewhere classified: Secondary | ICD-10-CM | POA: Diagnosis not present

## 2017-11-11 DIAGNOSIS — M25675 Stiffness of left foot, not elsewhere classified: Secondary | ICD-10-CM | POA: Diagnosis not present

## 2017-11-11 DIAGNOSIS — R2681 Unsteadiness on feet: Secondary | ICD-10-CM | POA: Diagnosis not present

## 2017-11-11 DIAGNOSIS — M25475 Effusion, left foot: Secondary | ICD-10-CM | POA: Diagnosis not present

## 2017-11-11 DIAGNOSIS — M25671 Stiffness of right ankle, not elsewhere classified: Secondary | ICD-10-CM | POA: Diagnosis not present

## 2017-11-11 DIAGNOSIS — M21612 Bunion of left foot: Secondary | ICD-10-CM | POA: Diagnosis not present

## 2017-11-11 DIAGNOSIS — R2689 Other abnormalities of gait and mobility: Secondary | ICD-10-CM | POA: Diagnosis not present

## 2017-11-11 DIAGNOSIS — M25472 Effusion, left ankle: Secondary | ICD-10-CM | POA: Diagnosis not present

## 2017-11-12 DIAGNOSIS — M25475 Effusion, left foot: Secondary | ICD-10-CM | POA: Diagnosis not present

## 2017-11-12 DIAGNOSIS — M25675 Stiffness of left foot, not elsewhere classified: Secondary | ICD-10-CM | POA: Diagnosis not present

## 2017-11-12 DIAGNOSIS — M25472 Effusion, left ankle: Secondary | ICD-10-CM | POA: Diagnosis not present

## 2017-11-12 DIAGNOSIS — R2681 Unsteadiness on feet: Secondary | ICD-10-CM | POA: Diagnosis not present

## 2017-11-12 DIAGNOSIS — M25572 Pain in left ankle and joints of left foot: Secondary | ICD-10-CM | POA: Diagnosis not present

## 2017-11-12 DIAGNOSIS — M25671 Stiffness of right ankle, not elsewhere classified: Secondary | ICD-10-CM | POA: Diagnosis not present

## 2017-11-12 DIAGNOSIS — M6281 Muscle weakness (generalized): Secondary | ICD-10-CM | POA: Diagnosis not present

## 2017-11-12 DIAGNOSIS — M25672 Stiffness of left ankle, not elsewhere classified: Secondary | ICD-10-CM | POA: Diagnosis not present

## 2017-11-12 DIAGNOSIS — M21612 Bunion of left foot: Secondary | ICD-10-CM | POA: Diagnosis not present

## 2017-11-12 DIAGNOSIS — R2689 Other abnormalities of gait and mobility: Secondary | ICD-10-CM | POA: Diagnosis not present

## 2017-11-13 DIAGNOSIS — M2012 Hallux valgus (acquired), left foot: Secondary | ICD-10-CM | POA: Diagnosis not present

## 2017-11-17 DIAGNOSIS — M25672 Stiffness of left ankle, not elsewhere classified: Secondary | ICD-10-CM | POA: Diagnosis not present

## 2017-11-17 DIAGNOSIS — M21612 Bunion of left foot: Secondary | ICD-10-CM | POA: Diagnosis not present

## 2017-11-17 DIAGNOSIS — M25572 Pain in left ankle and joints of left foot: Secondary | ICD-10-CM | POA: Diagnosis not present

## 2017-11-17 DIAGNOSIS — M25671 Stiffness of right ankle, not elsewhere classified: Secondary | ICD-10-CM | POA: Diagnosis not present

## 2017-11-17 DIAGNOSIS — R2689 Other abnormalities of gait and mobility: Secondary | ICD-10-CM | POA: Diagnosis not present

## 2017-11-17 DIAGNOSIS — M25675 Stiffness of left foot, not elsewhere classified: Secondary | ICD-10-CM | POA: Diagnosis not present

## 2017-11-17 DIAGNOSIS — M25472 Effusion, left ankle: Secondary | ICD-10-CM | POA: Diagnosis not present

## 2017-11-17 DIAGNOSIS — R2681 Unsteadiness on feet: Secondary | ICD-10-CM | POA: Diagnosis not present

## 2017-11-17 DIAGNOSIS — M25475 Effusion, left foot: Secondary | ICD-10-CM | POA: Diagnosis not present

## 2017-11-17 DIAGNOSIS — M6281 Muscle weakness (generalized): Secondary | ICD-10-CM | POA: Diagnosis not present

## 2017-11-19 DIAGNOSIS — M25675 Stiffness of left foot, not elsewhere classified: Secondary | ICD-10-CM | POA: Diagnosis not present

## 2017-11-19 DIAGNOSIS — M25672 Stiffness of left ankle, not elsewhere classified: Secondary | ICD-10-CM | POA: Diagnosis not present

## 2017-11-19 DIAGNOSIS — R2689 Other abnormalities of gait and mobility: Secondary | ICD-10-CM | POA: Diagnosis not present

## 2017-11-19 DIAGNOSIS — M21612 Bunion of left foot: Secondary | ICD-10-CM | POA: Diagnosis not present

## 2017-11-19 DIAGNOSIS — C50912 Malignant neoplasm of unspecified site of left female breast: Secondary | ICD-10-CM | POA: Diagnosis not present

## 2017-11-19 DIAGNOSIS — M25475 Effusion, left foot: Secondary | ICD-10-CM | POA: Diagnosis not present

## 2017-11-19 DIAGNOSIS — M810 Age-related osteoporosis without current pathological fracture: Secondary | ICD-10-CM | POA: Diagnosis not present

## 2017-11-19 DIAGNOSIS — M25671 Stiffness of right ankle, not elsewhere classified: Secondary | ICD-10-CM | POA: Diagnosis not present

## 2017-11-19 DIAGNOSIS — M6281 Muscle weakness (generalized): Secondary | ICD-10-CM | POA: Diagnosis not present

## 2017-11-19 DIAGNOSIS — M25472 Effusion, left ankle: Secondary | ICD-10-CM | POA: Diagnosis not present

## 2017-11-19 DIAGNOSIS — R29898 Other symptoms and signs involving the musculoskeletal system: Secondary | ICD-10-CM | POA: Diagnosis not present

## 2017-11-19 DIAGNOSIS — M25572 Pain in left ankle and joints of left foot: Secondary | ICD-10-CM | POA: Diagnosis not present

## 2017-11-19 DIAGNOSIS — R2681 Unsteadiness on feet: Secondary | ICD-10-CM | POA: Diagnosis not present

## 2017-11-19 DIAGNOSIS — M159 Polyosteoarthritis, unspecified: Secondary | ICD-10-CM | POA: Diagnosis not present

## 2017-11-24 DIAGNOSIS — M25475 Effusion, left foot: Secondary | ICD-10-CM | POA: Diagnosis not present

## 2017-11-24 DIAGNOSIS — M25572 Pain in left ankle and joints of left foot: Secondary | ICD-10-CM | POA: Diagnosis not present

## 2017-11-24 DIAGNOSIS — M21612 Bunion of left foot: Secondary | ICD-10-CM | POA: Diagnosis not present

## 2017-11-24 DIAGNOSIS — M25675 Stiffness of left foot, not elsewhere classified: Secondary | ICD-10-CM | POA: Diagnosis not present

## 2017-11-24 DIAGNOSIS — R2681 Unsteadiness on feet: Secondary | ICD-10-CM | POA: Diagnosis not present

## 2017-11-24 DIAGNOSIS — M6281 Muscle weakness (generalized): Secondary | ICD-10-CM | POA: Diagnosis not present

## 2017-11-24 DIAGNOSIS — M25472 Effusion, left ankle: Secondary | ICD-10-CM | POA: Diagnosis not present

## 2017-11-24 DIAGNOSIS — M25672 Stiffness of left ankle, not elsewhere classified: Secondary | ICD-10-CM | POA: Diagnosis not present

## 2017-11-24 DIAGNOSIS — R2689 Other abnormalities of gait and mobility: Secondary | ICD-10-CM | POA: Diagnosis not present

## 2017-11-24 DIAGNOSIS — M25671 Stiffness of right ankle, not elsewhere classified: Secondary | ICD-10-CM | POA: Diagnosis not present

## 2017-11-25 DIAGNOSIS — M21612 Bunion of left foot: Secondary | ICD-10-CM | POA: Diagnosis not present

## 2017-11-25 DIAGNOSIS — M25675 Stiffness of left foot, not elsewhere classified: Secondary | ICD-10-CM | POA: Diagnosis not present

## 2017-11-25 DIAGNOSIS — M25672 Stiffness of left ankle, not elsewhere classified: Secondary | ICD-10-CM | POA: Diagnosis not present

## 2017-11-25 DIAGNOSIS — R2689 Other abnormalities of gait and mobility: Secondary | ICD-10-CM | POA: Diagnosis not present

## 2017-11-25 DIAGNOSIS — M25572 Pain in left ankle and joints of left foot: Secondary | ICD-10-CM | POA: Diagnosis not present

## 2017-11-25 DIAGNOSIS — M25671 Stiffness of right ankle, not elsewhere classified: Secondary | ICD-10-CM | POA: Diagnosis not present

## 2017-11-25 DIAGNOSIS — M25472 Effusion, left ankle: Secondary | ICD-10-CM | POA: Diagnosis not present

## 2017-11-25 DIAGNOSIS — R2681 Unsteadiness on feet: Secondary | ICD-10-CM | POA: Diagnosis not present

## 2017-11-25 DIAGNOSIS — M6281 Muscle weakness (generalized): Secondary | ICD-10-CM | POA: Diagnosis not present

## 2017-11-25 DIAGNOSIS — M25475 Effusion, left foot: Secondary | ICD-10-CM | POA: Diagnosis not present

## 2017-11-30 DIAGNOSIS — M25475 Effusion, left foot: Secondary | ICD-10-CM | POA: Diagnosis not present

## 2017-11-30 DIAGNOSIS — M25472 Effusion, left ankle: Secondary | ICD-10-CM | POA: Diagnosis not present

## 2017-11-30 DIAGNOSIS — M25672 Stiffness of left ankle, not elsewhere classified: Secondary | ICD-10-CM | POA: Diagnosis not present

## 2017-11-30 DIAGNOSIS — M25675 Stiffness of left foot, not elsewhere classified: Secondary | ICD-10-CM | POA: Diagnosis not present

## 2017-11-30 DIAGNOSIS — R2681 Unsteadiness on feet: Secondary | ICD-10-CM | POA: Diagnosis not present

## 2017-11-30 DIAGNOSIS — M21612 Bunion of left foot: Secondary | ICD-10-CM | POA: Diagnosis not present

## 2017-11-30 DIAGNOSIS — M6281 Muscle weakness (generalized): Secondary | ICD-10-CM | POA: Diagnosis not present

## 2017-11-30 DIAGNOSIS — R2689 Other abnormalities of gait and mobility: Secondary | ICD-10-CM | POA: Diagnosis not present

## 2017-11-30 DIAGNOSIS — M25671 Stiffness of right ankle, not elsewhere classified: Secondary | ICD-10-CM | POA: Diagnosis not present

## 2017-11-30 DIAGNOSIS — M25572 Pain in left ankle and joints of left foot: Secondary | ICD-10-CM | POA: Diagnosis not present

## 2017-12-02 DIAGNOSIS — M545 Low back pain: Secondary | ICD-10-CM | POA: Diagnosis not present

## 2017-12-02 DIAGNOSIS — Z79899 Other long term (current) drug therapy: Secondary | ICD-10-CM | POA: Diagnosis not present

## 2017-12-02 DIAGNOSIS — M961 Postlaminectomy syndrome, not elsewhere classified: Secondary | ICD-10-CM | POA: Diagnosis not present

## 2017-12-02 DIAGNOSIS — G894 Chronic pain syndrome: Secondary | ICD-10-CM | POA: Diagnosis not present

## 2017-12-03 DIAGNOSIS — M25475 Effusion, left foot: Secondary | ICD-10-CM | POA: Diagnosis not present

## 2017-12-03 DIAGNOSIS — M25675 Stiffness of left foot, not elsewhere classified: Secondary | ICD-10-CM | POA: Diagnosis not present

## 2017-12-03 DIAGNOSIS — R2681 Unsteadiness on feet: Secondary | ICD-10-CM | POA: Diagnosis not present

## 2017-12-03 DIAGNOSIS — R2689 Other abnormalities of gait and mobility: Secondary | ICD-10-CM | POA: Diagnosis not present

## 2017-12-03 DIAGNOSIS — M21612 Bunion of left foot: Secondary | ICD-10-CM | POA: Diagnosis not present

## 2017-12-03 DIAGNOSIS — M25671 Stiffness of right ankle, not elsewhere classified: Secondary | ICD-10-CM | POA: Diagnosis not present

## 2017-12-03 DIAGNOSIS — M6281 Muscle weakness (generalized): Secondary | ICD-10-CM | POA: Diagnosis not present

## 2017-12-03 DIAGNOSIS — M25472 Effusion, left ankle: Secondary | ICD-10-CM | POA: Diagnosis not present

## 2017-12-03 DIAGNOSIS — M25572 Pain in left ankle and joints of left foot: Secondary | ICD-10-CM | POA: Diagnosis not present

## 2017-12-03 DIAGNOSIS — M25672 Stiffness of left ankle, not elsewhere classified: Secondary | ICD-10-CM | POA: Diagnosis not present

## 2017-12-15 DIAGNOSIS — G2401 Drug induced subacute dyskinesia: Secondary | ICD-10-CM | POA: Diagnosis not present

## 2017-12-20 DIAGNOSIS — M159 Polyosteoarthritis, unspecified: Secondary | ICD-10-CM | POA: Diagnosis not present

## 2017-12-20 DIAGNOSIS — R29898 Other symptoms and signs involving the musculoskeletal system: Secondary | ICD-10-CM | POA: Diagnosis not present

## 2017-12-21 DIAGNOSIS — C50412 Malignant neoplasm of upper-outer quadrant of left female breast: Secondary | ICD-10-CM | POA: Diagnosis not present

## 2017-12-21 DIAGNOSIS — Z170 Estrogen receptor positive status [ER+]: Secondary | ICD-10-CM | POA: Diagnosis not present

## 2017-12-21 DIAGNOSIS — Z923 Personal history of irradiation: Secondary | ICD-10-CM | POA: Diagnosis not present

## 2017-12-21 DIAGNOSIS — Z79811 Long term (current) use of aromatase inhibitors: Secondary | ICD-10-CM | POA: Diagnosis not present

## 2017-12-21 DIAGNOSIS — Z853 Personal history of malignant neoplasm of breast: Secondary | ICD-10-CM | POA: Diagnosis not present

## 2017-12-25 DIAGNOSIS — M79672 Pain in left foot: Secondary | ICD-10-CM | POA: Diagnosis not present

## 2017-12-25 DIAGNOSIS — M25771 Osteophyte, right ankle: Secondary | ICD-10-CM | POA: Diagnosis not present

## 2017-12-25 DIAGNOSIS — L97509 Non-pressure chronic ulcer of other part of unspecified foot with unspecified severity: Secondary | ICD-10-CM | POA: Diagnosis not present

## 2017-12-30 DIAGNOSIS — R251 Tremor, unspecified: Secondary | ICD-10-CM | POA: Diagnosis not present

## 2018-01-12 ENCOUNTER — Other Ambulatory Visit: Payer: Self-pay | Admitting: Orthopedic Surgery

## 2018-01-12 DIAGNOSIS — L97312 Non-pressure chronic ulcer of right ankle with fat layer exposed: Secondary | ICD-10-CM | POA: Diagnosis not present

## 2018-01-12 DIAGNOSIS — M25771 Osteophyte, right ankle: Secondary | ICD-10-CM | POA: Diagnosis not present

## 2018-01-12 DIAGNOSIS — G8918 Other acute postprocedural pain: Secondary | ICD-10-CM | POA: Diagnosis not present

## 2018-01-12 DIAGNOSIS — L83 Acanthosis nigricans: Secondary | ICD-10-CM | POA: Diagnosis not present

## 2018-01-12 DIAGNOSIS — M898X7 Other specified disorders of bone, ankle and foot: Secondary | ICD-10-CM | POA: Diagnosis not present

## 2018-01-19 DIAGNOSIS — R29898 Other symptoms and signs involving the musculoskeletal system: Secondary | ICD-10-CM | POA: Diagnosis not present

## 2018-01-19 DIAGNOSIS — M159 Polyosteoarthritis, unspecified: Secondary | ICD-10-CM | POA: Diagnosis not present

## 2018-02-15 ENCOUNTER — Telehealth: Payer: Self-pay | Admitting: *Deleted

## 2018-02-15 ENCOUNTER — Ambulatory Visit: Payer: Medicare Other | Admitting: Neurology

## 2018-02-15 NOTE — Telephone Encounter (Signed)
Patient called to cancel new patient appt same day due to sickness.  She called prior to our office opening and was instructed by our after hours operators to call back at 8am.  She called to cancel when the office opened.

## 2018-02-19 DIAGNOSIS — R29898 Other symptoms and signs involving the musculoskeletal system: Secondary | ICD-10-CM | POA: Diagnosis not present

## 2018-02-19 DIAGNOSIS — M159 Polyosteoarthritis, unspecified: Secondary | ICD-10-CM | POA: Diagnosis not present

## 2018-02-21 DIAGNOSIS — K5730 Diverticulosis of large intestine without perforation or abscess without bleeding: Secondary | ICD-10-CM | POA: Diagnosis not present

## 2018-02-21 DIAGNOSIS — E876 Hypokalemia: Secondary | ICD-10-CM | POA: Diagnosis not present

## 2018-02-21 DIAGNOSIS — N390 Urinary tract infection, site not specified: Secondary | ICD-10-CM | POA: Diagnosis not present

## 2018-02-21 DIAGNOSIS — R1013 Epigastric pain: Secondary | ICD-10-CM | POA: Diagnosis not present

## 2018-02-21 DIAGNOSIS — R112 Nausea with vomiting, unspecified: Secondary | ICD-10-CM | POA: Diagnosis not present

## 2018-02-22 DIAGNOSIS — M810 Age-related osteoporosis without current pathological fracture: Secondary | ICD-10-CM | POA: Diagnosis not present

## 2018-02-22 DIAGNOSIS — N390 Urinary tract infection, site not specified: Secondary | ICD-10-CM | POA: Diagnosis not present

## 2018-02-22 DIAGNOSIS — Z87891 Personal history of nicotine dependence: Secondary | ICD-10-CM | POA: Diagnosis not present

## 2018-02-22 DIAGNOSIS — R51 Headache: Secondary | ICD-10-CM | POA: Diagnosis not present

## 2018-02-22 DIAGNOSIS — R1084 Generalized abdominal pain: Secondary | ICD-10-CM | POA: Diagnosis not present

## 2018-02-22 DIAGNOSIS — K219 Gastro-esophageal reflux disease without esophagitis: Secondary | ICD-10-CM | POA: Diagnosis not present

## 2018-02-22 DIAGNOSIS — R5381 Other malaise: Secondary | ICD-10-CM | POA: Diagnosis not present

## 2018-02-22 DIAGNOSIS — R52 Pain, unspecified: Secondary | ICD-10-CM | POA: Diagnosis not present

## 2018-02-22 DIAGNOSIS — E860 Dehydration: Secondary | ICD-10-CM | POA: Diagnosis not present

## 2018-02-22 DIAGNOSIS — G8929 Other chronic pain: Secondary | ICD-10-CM | POA: Diagnosis not present

## 2018-02-22 DIAGNOSIS — R531 Weakness: Secondary | ICD-10-CM | POA: Diagnosis not present

## 2018-02-22 DIAGNOSIS — R112 Nausea with vomiting, unspecified: Secondary | ICD-10-CM | POA: Diagnosis not present

## 2018-02-22 DIAGNOSIS — Z79899 Other long term (current) drug therapy: Secondary | ICD-10-CM | POA: Diagnosis not present

## 2018-02-22 DIAGNOSIS — R1013 Epigastric pain: Secondary | ICD-10-CM | POA: Diagnosis not present

## 2018-02-22 DIAGNOSIS — N12 Tubulo-interstitial nephritis, not specified as acute or chronic: Secondary | ICD-10-CM | POA: Diagnosis not present

## 2018-02-22 DIAGNOSIS — N10 Acute tubulo-interstitial nephritis: Secondary | ICD-10-CM | POA: Diagnosis not present

## 2018-02-22 DIAGNOSIS — R1011 Right upper quadrant pain: Secondary | ICD-10-CM | POA: Diagnosis not present

## 2018-02-22 DIAGNOSIS — Z853 Personal history of malignant neoplasm of breast: Secondary | ICD-10-CM | POA: Diagnosis not present

## 2018-02-22 DIAGNOSIS — I700 Atherosclerosis of aorta: Secondary | ICD-10-CM | POA: Diagnosis not present

## 2018-02-23 DIAGNOSIS — R1013 Epigastric pain: Secondary | ICD-10-CM | POA: Diagnosis not present

## 2018-02-23 DIAGNOSIS — N10 Acute tubulo-interstitial nephritis: Secondary | ICD-10-CM | POA: Diagnosis not present

## 2018-02-23 DIAGNOSIS — R531 Weakness: Secondary | ICD-10-CM | POA: Diagnosis not present

## 2018-02-23 DIAGNOSIS — R112 Nausea with vomiting, unspecified: Secondary | ICD-10-CM | POA: Diagnosis not present

## 2018-02-23 DIAGNOSIS — N390 Urinary tract infection, site not specified: Secondary | ICD-10-CM | POA: Diagnosis not present

## 2018-03-19 DIAGNOSIS — J069 Acute upper respiratory infection, unspecified: Secondary | ICD-10-CM | POA: Diagnosis not present

## 2018-03-19 DIAGNOSIS — R251 Tremor, unspecified: Secondary | ICD-10-CM | POA: Diagnosis not present

## 2018-03-19 DIAGNOSIS — Z79899 Other long term (current) drug therapy: Secondary | ICD-10-CM | POA: Diagnosis not present

## 2018-03-19 DIAGNOSIS — R609 Edema, unspecified: Secondary | ICD-10-CM | POA: Diagnosis not present

## 2018-03-21 DIAGNOSIS — R29898 Other symptoms and signs involving the musculoskeletal system: Secondary | ICD-10-CM | POA: Diagnosis not present

## 2018-03-21 DIAGNOSIS — M159 Polyosteoarthritis, unspecified: Secondary | ICD-10-CM | POA: Diagnosis not present

## 2018-03-30 DIAGNOSIS — M961 Postlaminectomy syndrome, not elsewhere classified: Secondary | ICD-10-CM | POA: Diagnosis not present

## 2018-03-30 DIAGNOSIS — G894 Chronic pain syndrome: Secondary | ICD-10-CM | POA: Diagnosis not present

## 2018-03-30 DIAGNOSIS — Z79899 Other long term (current) drug therapy: Secondary | ICD-10-CM | POA: Diagnosis not present

## 2018-03-30 DIAGNOSIS — M545 Low back pain: Secondary | ICD-10-CM | POA: Diagnosis not present

## 2018-03-30 DIAGNOSIS — Z79891 Long term (current) use of opiate analgesic: Secondary | ICD-10-CM | POA: Diagnosis not present

## 2018-04-21 DIAGNOSIS — M159 Polyosteoarthritis, unspecified: Secondary | ICD-10-CM | POA: Diagnosis not present

## 2018-04-21 DIAGNOSIS — R29898 Other symptoms and signs involving the musculoskeletal system: Secondary | ICD-10-CM | POA: Diagnosis not present

## 2018-04-22 DIAGNOSIS — Z170 Estrogen receptor positive status [ER+]: Secondary | ICD-10-CM | POA: Diagnosis not present

## 2018-04-22 DIAGNOSIS — R05 Cough: Secondary | ICD-10-CM | POA: Diagnosis not present

## 2018-04-22 DIAGNOSIS — Z6822 Body mass index (BMI) 22.0-22.9, adult: Secondary | ICD-10-CM | POA: Diagnosis not present

## 2018-04-22 DIAGNOSIS — Z853 Personal history of malignant neoplasm of breast: Secondary | ICD-10-CM | POA: Diagnosis not present

## 2018-04-22 DIAGNOSIS — R062 Wheezing: Secondary | ICD-10-CM | POA: Diagnosis not present

## 2018-04-22 DIAGNOSIS — C50412 Malignant neoplasm of upper-outer quadrant of left female breast: Secondary | ICD-10-CM | POA: Diagnosis not present

## 2018-04-22 DIAGNOSIS — Z79811 Long term (current) use of aromatase inhibitors: Secondary | ICD-10-CM | POA: Diagnosis not present

## 2018-05-07 DIAGNOSIS — L97309 Non-pressure chronic ulcer of unspecified ankle with unspecified severity: Secondary | ICD-10-CM | POA: Diagnosis not present

## 2018-05-07 DIAGNOSIS — M7741 Metatarsalgia, right foot: Secondary | ICD-10-CM | POA: Diagnosis not present

## 2018-05-20 DIAGNOSIS — E785 Hyperlipidemia, unspecified: Secondary | ICD-10-CM | POA: Diagnosis not present

## 2018-05-20 DIAGNOSIS — R05 Cough: Secondary | ICD-10-CM | POA: Diagnosis not present

## 2018-05-20 DIAGNOSIS — Z6824 Body mass index (BMI) 24.0-24.9, adult: Secondary | ICD-10-CM | POA: Diagnosis not present

## 2018-05-20 DIAGNOSIS — J302 Other seasonal allergic rhinitis: Secondary | ICD-10-CM | POA: Diagnosis not present

## 2018-05-22 DIAGNOSIS — R29898 Other symptoms and signs involving the musculoskeletal system: Secondary | ICD-10-CM | POA: Diagnosis not present

## 2018-05-22 DIAGNOSIS — M159 Polyosteoarthritis, unspecified: Secondary | ICD-10-CM | POA: Diagnosis not present

## 2018-07-09 IMAGING — CR DG PORTABLE PELVIS
2 series · 2 of 2 positions shown · non-contrast
Comparison: Intraoperative imaging this same day. CT abdomen and
pelvis 12/19/2015.

CLINICAL DATA: Status post left hip replacement today.

EXAM:
PORTABLE PELVIS 1-2 VIEWS

[AP (1 of 2)]
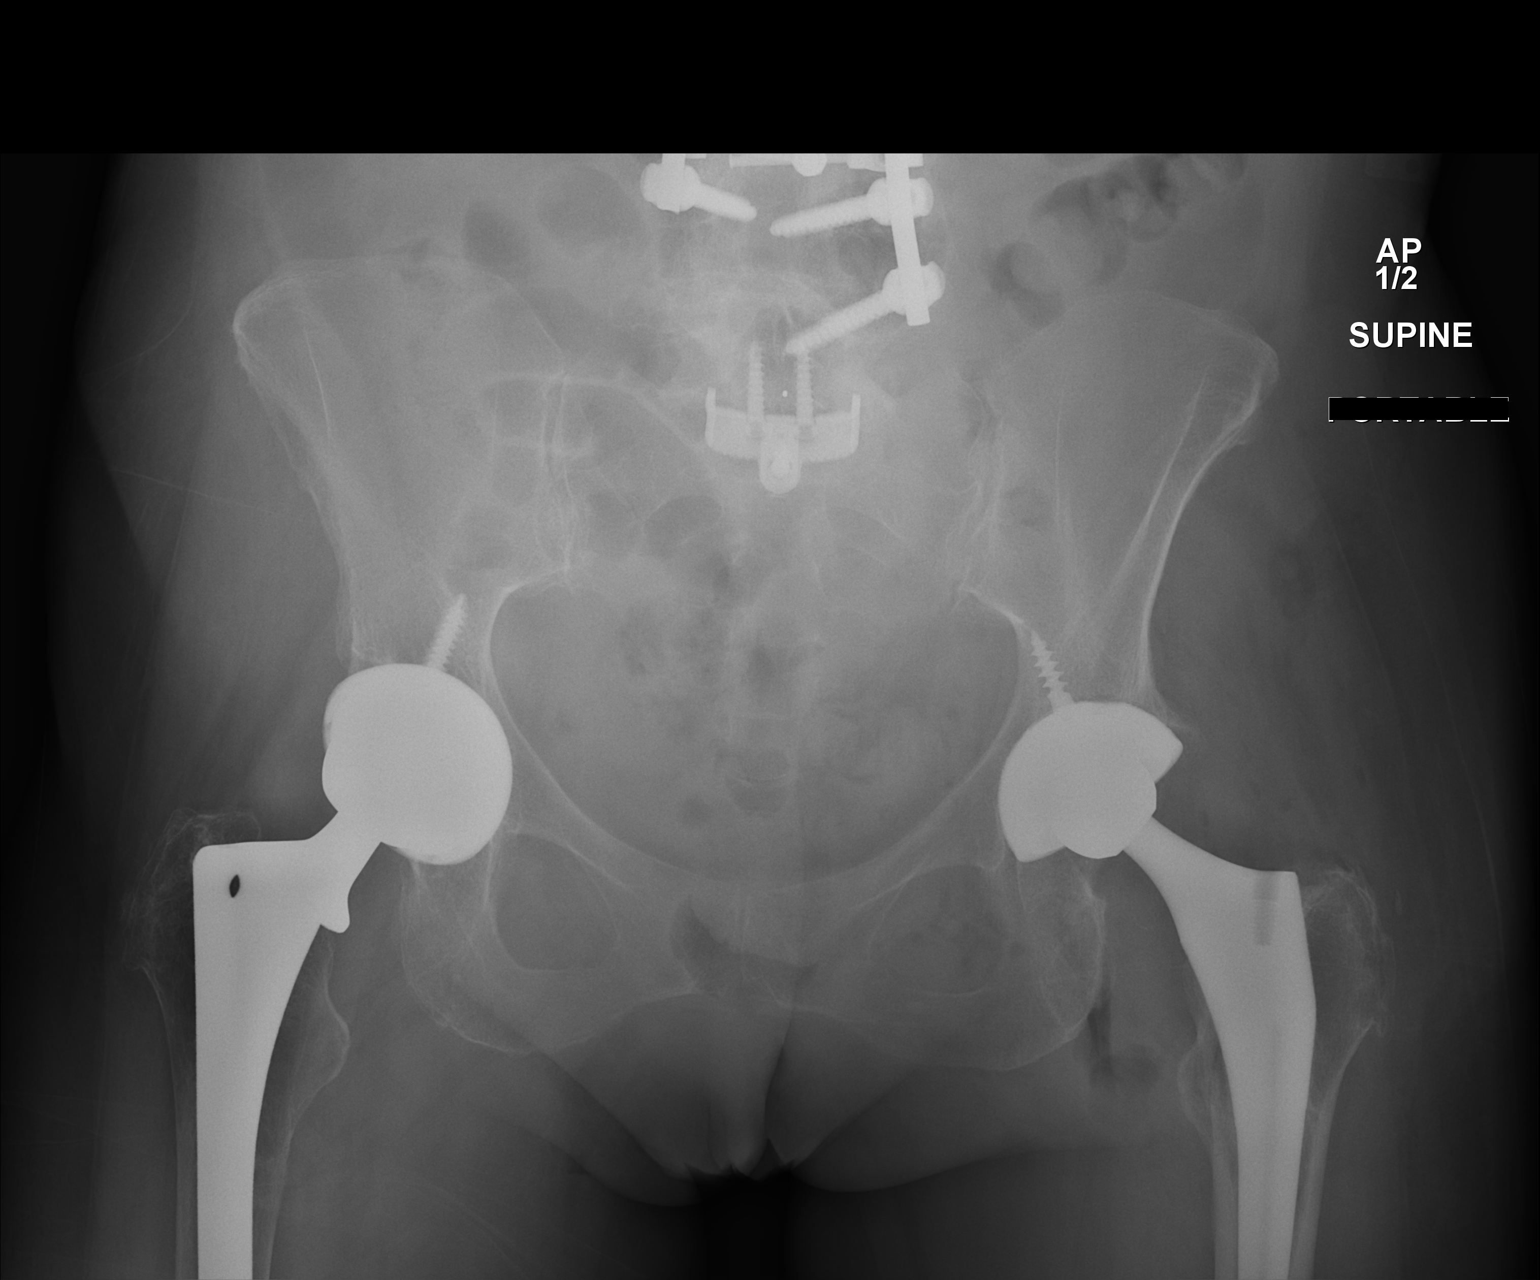

[AP (2 of 2)]
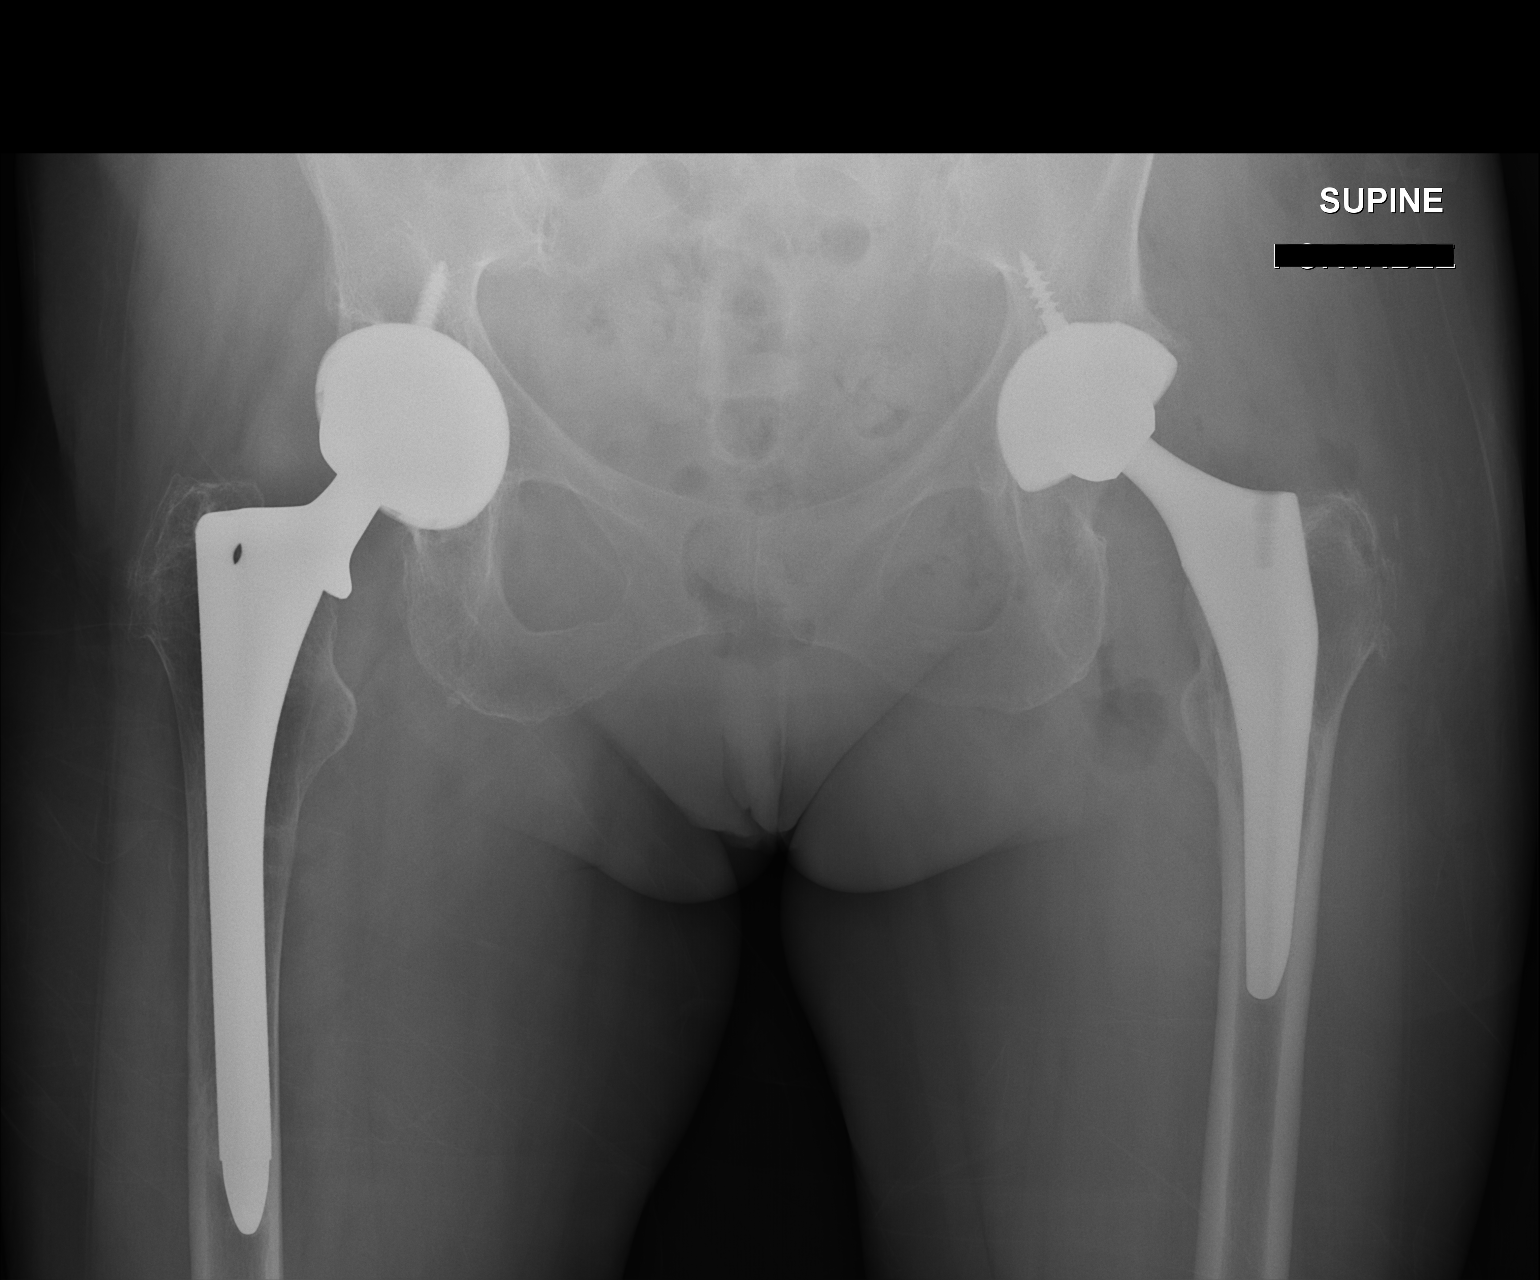

[2 of 2 positions shown; findings below may reference images not displayed]

FINDINGS: Bilateral total hip arthroplasties are in place. Left hip
hemiarthroplasty seen on the prior CT scan has been converted to a
new total hip replacement. The device is located. There is no
fracture. Lower lumbar fusion hardware is partially imaged.
IMPRESSION: Conversion of a left hip hemiarthroplasty to a total hip replacement
without evidence of complication. No acute finding.

## 2018-07-09 IMAGING — RF DG C-ARM 61-120 MIN
1 series · 2 of 2 positions shown · non-contrast
Comparison: CT scan of the abdomen and pelvis dated 12/19/2015

CLINICAL DATA: Inversion from left hip hemiarthroplasty to left
total hip replacement.

EXAM:
OPERATIVE left HIP (WITH PELVIS IF PERFORMED) 1 VIEW
TECHNIQUE: Fluoroscopic spot image(s) were submitted for interpretation
post-operatively.

[Series 1: run · 2 of 2 slices shown]
[im 1/2]
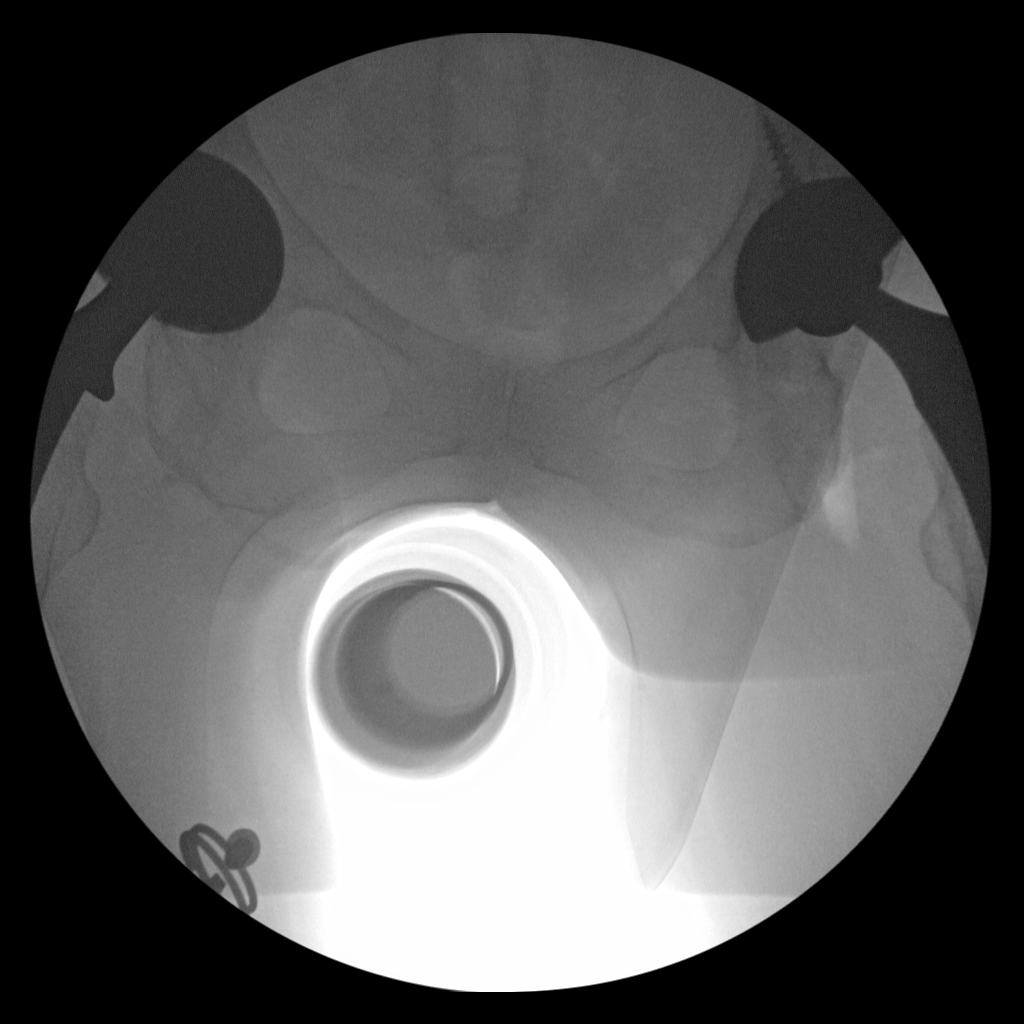
[im 2/2]
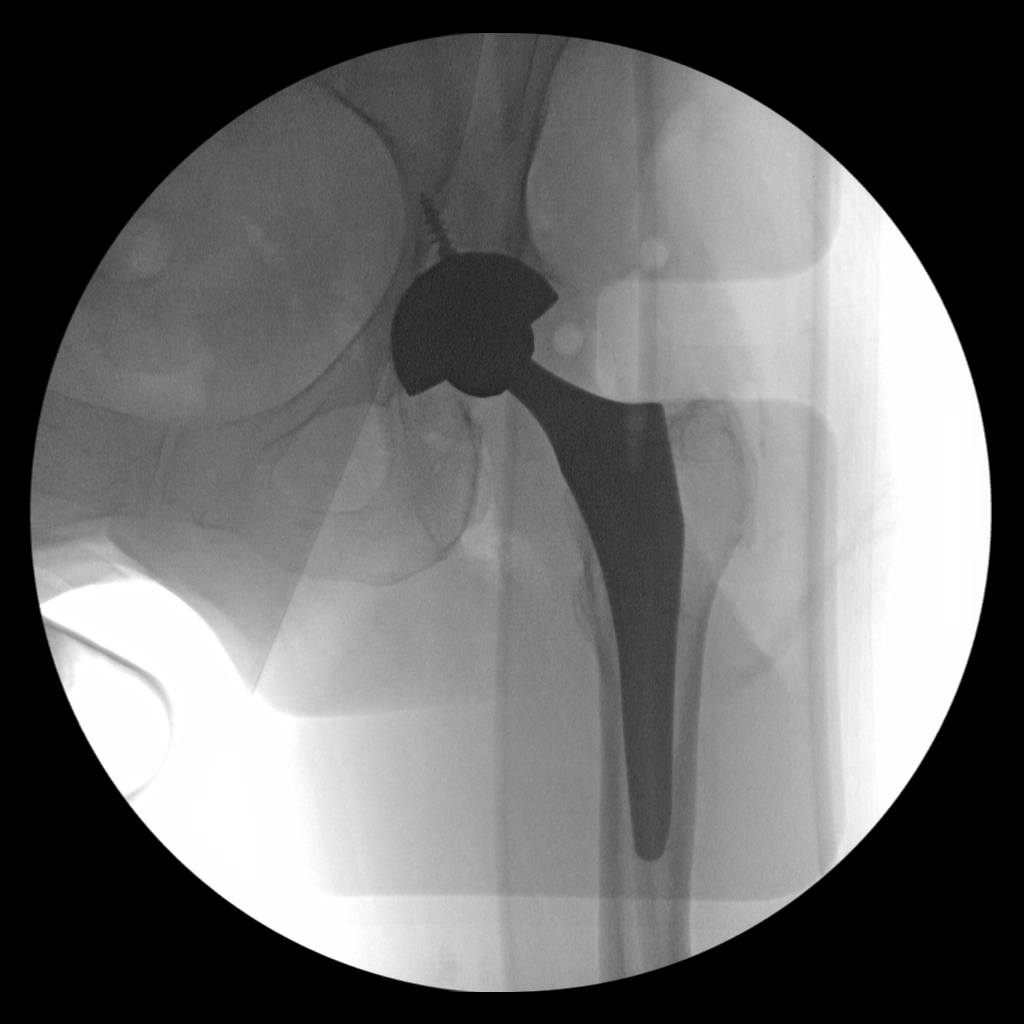

[2 of 2 positions shown; findings below may reference images not displayed]

FINDINGS: The patient has, undergone conversion of the hemiarthroplasty to
total hip prosthesis. The acetabular and femoral components appear
in excellent position in the AP projection.
IMPRESSION: Satisfactory appearance of the left hip in the AP projection after
left total hip prosthesis insertion.

## 2018-07-19 DIAGNOSIS — R05 Cough: Secondary | ICD-10-CM | POA: Diagnosis not present

## 2018-07-19 DIAGNOSIS — E785 Hyperlipidemia, unspecified: Secondary | ICD-10-CM | POA: Diagnosis not present

## 2018-08-04 DIAGNOSIS — G894 Chronic pain syndrome: Secondary | ICD-10-CM | POA: Diagnosis not present

## 2018-08-18 DIAGNOSIS — Z0000 Encounter for general adult medical examination without abnormal findings: Secondary | ICD-10-CM | POA: Diagnosis not present

## 2018-08-18 DIAGNOSIS — E785 Hyperlipidemia, unspecified: Secondary | ICD-10-CM | POA: Diagnosis not present

## 2018-08-18 DIAGNOSIS — Z1231 Encounter for screening mammogram for malignant neoplasm of breast: Secondary | ICD-10-CM | POA: Diagnosis not present

## 2018-08-18 DIAGNOSIS — Z9181 History of falling: Secondary | ICD-10-CM | POA: Diagnosis not present

## 2018-08-18 DIAGNOSIS — N959 Unspecified menopausal and perimenopausal disorder: Secondary | ICD-10-CM | POA: Diagnosis not present

## 2018-08-18 DIAGNOSIS — Z139 Encounter for screening, unspecified: Secondary | ICD-10-CM | POA: Diagnosis not present

## 2018-09-13 DIAGNOSIS — K219 Gastro-esophageal reflux disease without esophagitis: Secondary | ICD-10-CM | POA: Diagnosis not present

## 2018-09-13 DIAGNOSIS — Z79899 Other long term (current) drug therapy: Secondary | ICD-10-CM | POA: Diagnosis not present

## 2018-09-13 DIAGNOSIS — G4700 Insomnia, unspecified: Secondary | ICD-10-CM | POA: Diagnosis not present

## 2018-09-13 DIAGNOSIS — E785 Hyperlipidemia, unspecified: Secondary | ICD-10-CM | POA: Diagnosis not present

## 2018-11-15 DIAGNOSIS — E785 Hyperlipidemia, unspecified: Secondary | ICD-10-CM | POA: Diagnosis not present

## 2018-11-15 DIAGNOSIS — J449 Chronic obstructive pulmonary disease, unspecified: Secondary | ICD-10-CM | POA: Diagnosis not present

## 2018-12-01 DIAGNOSIS — M545 Low back pain: Secondary | ICD-10-CM | POA: Diagnosis not present

## 2018-12-01 DIAGNOSIS — Z79891 Long term (current) use of opiate analgesic: Secondary | ICD-10-CM | POA: Diagnosis not present

## 2018-12-01 DIAGNOSIS — Z79899 Other long term (current) drug therapy: Secondary | ICD-10-CM | POA: Diagnosis not present

## 2018-12-01 DIAGNOSIS — G894 Chronic pain syndrome: Secondary | ICD-10-CM | POA: Diagnosis not present

## 2018-12-01 DIAGNOSIS — Z5181 Encounter for therapeutic drug level monitoring: Secondary | ICD-10-CM | POA: Diagnosis not present

## 2018-12-20 DIAGNOSIS — Z79811 Long term (current) use of aromatase inhibitors: Secondary | ICD-10-CM | POA: Diagnosis not present

## 2018-12-20 DIAGNOSIS — C50412 Malignant neoplasm of upper-outer quadrant of left female breast: Secondary | ICD-10-CM | POA: Diagnosis not present

## 2019-01-17 DIAGNOSIS — J449 Chronic obstructive pulmonary disease, unspecified: Secondary | ICD-10-CM | POA: Diagnosis not present

## 2019-01-17 DIAGNOSIS — Z79899 Other long term (current) drug therapy: Secondary | ICD-10-CM | POA: Diagnosis not present

## 2019-01-17 DIAGNOSIS — Z23 Encounter for immunization: Secondary | ICD-10-CM | POA: Diagnosis not present

## 2019-01-17 DIAGNOSIS — E785 Hyperlipidemia, unspecified: Secondary | ICD-10-CM | POA: Diagnosis not present

## 2019-03-21 DIAGNOSIS — K219 Gastro-esophageal reflux disease without esophagitis: Secondary | ICD-10-CM | POA: Diagnosis not present

## 2019-03-21 DIAGNOSIS — E785 Hyperlipidemia, unspecified: Secondary | ICD-10-CM | POA: Diagnosis not present

## 2019-03-21 DIAGNOSIS — J449 Chronic obstructive pulmonary disease, unspecified: Secondary | ICD-10-CM | POA: Diagnosis not present

## 2019-03-21 DIAGNOSIS — Z6820 Body mass index (BMI) 20.0-20.9, adult: Secondary | ICD-10-CM | POA: Diagnosis not present

## 2019-04-01 DIAGNOSIS — G894 Chronic pain syndrome: Secondary | ICD-10-CM | POA: Diagnosis not present

## 2019-05-17 DIAGNOSIS — R29898 Other symptoms and signs involving the musculoskeletal system: Secondary | ICD-10-CM | POA: Diagnosis not present

## 2019-05-17 DIAGNOSIS — K219 Gastro-esophageal reflux disease without esophagitis: Secondary | ICD-10-CM | POA: Diagnosis not present

## 2019-05-17 DIAGNOSIS — L989 Disorder of the skin and subcutaneous tissue, unspecified: Secondary | ICD-10-CM | POA: Diagnosis not present

## 2019-05-26 DIAGNOSIS — L853 Xerosis cutis: Secondary | ICD-10-CM | POA: Diagnosis not present

## 2019-05-26 DIAGNOSIS — L821 Other seborrheic keratosis: Secondary | ICD-10-CM | POA: Diagnosis not present

## 2019-05-26 DIAGNOSIS — L570 Actinic keratosis: Secondary | ICD-10-CM | POA: Diagnosis not present

## 2019-06-30 DIAGNOSIS — G894 Chronic pain syndrome: Secondary | ICD-10-CM | POA: Diagnosis not present

## 2019-07-20 DIAGNOSIS — K219 Gastro-esophageal reflux disease without esophagitis: Secondary | ICD-10-CM | POA: Diagnosis not present

## 2019-07-20 DIAGNOSIS — J449 Chronic obstructive pulmonary disease, unspecified: Secondary | ICD-10-CM | POA: Diagnosis not present

## 2019-07-20 DIAGNOSIS — E785 Hyperlipidemia, unspecified: Secondary | ICD-10-CM | POA: Diagnosis not present

## 2019-07-20 DIAGNOSIS — Z79899 Other long term (current) drug therapy: Secondary | ICD-10-CM | POA: Diagnosis not present

## 2019-08-04 DIAGNOSIS — L570 Actinic keratosis: Secondary | ICD-10-CM | POA: Diagnosis not present

## 2019-08-04 DIAGNOSIS — L821 Other seborrheic keratosis: Secondary | ICD-10-CM | POA: Diagnosis not present

## 2019-08-04 DIAGNOSIS — L578 Other skin changes due to chronic exposure to nonionizing radiation: Secondary | ICD-10-CM | POA: Diagnosis not present

## 2019-08-04 DIAGNOSIS — L820 Inflamed seborrheic keratosis: Secondary | ICD-10-CM | POA: Diagnosis not present

## 2019-08-04 DIAGNOSIS — D485 Neoplasm of uncertain behavior of skin: Secondary | ICD-10-CM | POA: Diagnosis not present

## 2019-08-23 DIAGNOSIS — Z139 Encounter for screening, unspecified: Secondary | ICD-10-CM | POA: Diagnosis not present

## 2019-08-23 DIAGNOSIS — Z1231 Encounter for screening mammogram for malignant neoplasm of breast: Secondary | ICD-10-CM | POA: Diagnosis not present

## 2019-08-23 DIAGNOSIS — Z1211 Encounter for screening for malignant neoplasm of colon: Secondary | ICD-10-CM | POA: Diagnosis not present

## 2019-08-23 DIAGNOSIS — E785 Hyperlipidemia, unspecified: Secondary | ICD-10-CM | POA: Diagnosis not present

## 2019-08-23 DIAGNOSIS — Z0000 Encounter for general adult medical examination without abnormal findings: Secondary | ICD-10-CM | POA: Diagnosis not present

## 2019-08-23 DIAGNOSIS — Z9181 History of falling: Secondary | ICD-10-CM | POA: Diagnosis not present

## 2019-09-02 DIAGNOSIS — R928 Other abnormal and inconclusive findings on diagnostic imaging of breast: Secondary | ICD-10-CM | POA: Diagnosis not present

## 2019-09-02 DIAGNOSIS — Z853 Personal history of malignant neoplasm of breast: Secondary | ICD-10-CM | POA: Diagnosis not present

## 2019-09-02 DIAGNOSIS — N959 Unspecified menopausal and perimenopausal disorder: Secondary | ICD-10-CM | POA: Diagnosis not present

## 2019-09-02 DIAGNOSIS — M810 Age-related osteoporosis without current pathological fracture: Secondary | ICD-10-CM | POA: Diagnosis not present

## 2019-09-19 DIAGNOSIS — G4700 Insomnia, unspecified: Secondary | ICD-10-CM | POA: Diagnosis not present

## 2019-09-19 DIAGNOSIS — M810 Age-related osteoporosis without current pathological fracture: Secondary | ICD-10-CM | POA: Diagnosis not present

## 2019-09-19 DIAGNOSIS — K219 Gastro-esophageal reflux disease without esophagitis: Secondary | ICD-10-CM | POA: Diagnosis not present

## 2019-09-19 DIAGNOSIS — Z6820 Body mass index (BMI) 20.0-20.9, adult: Secondary | ICD-10-CM | POA: Diagnosis not present

## 2019-09-21 DIAGNOSIS — R110 Nausea: Secondary | ICD-10-CM | POA: Diagnosis not present

## 2019-09-30 DIAGNOSIS — Z1159 Encounter for screening for other viral diseases: Secondary | ICD-10-CM | POA: Diagnosis not present

## 2019-10-06 DIAGNOSIS — K254 Chronic or unspecified gastric ulcer with hemorrhage: Secondary | ICD-10-CM | POA: Diagnosis not present

## 2019-10-06 DIAGNOSIS — K2970 Gastritis, unspecified, without bleeding: Secondary | ICD-10-CM | POA: Diagnosis not present

## 2019-10-06 DIAGNOSIS — R1310 Dysphagia, unspecified: Secondary | ICD-10-CM | POA: Diagnosis not present

## 2019-10-06 DIAGNOSIS — R112 Nausea with vomiting, unspecified: Secondary | ICD-10-CM | POA: Diagnosis not present

## 2019-10-06 DIAGNOSIS — K228 Other specified diseases of esophagus: Secondary | ICD-10-CM | POA: Diagnosis not present

## 2019-10-06 DIAGNOSIS — Z85828 Personal history of other malignant neoplasm of skin: Secondary | ICD-10-CM | POA: Diagnosis not present

## 2019-10-06 DIAGNOSIS — Z79899 Other long term (current) drug therapy: Secondary | ICD-10-CM | POA: Diagnosis not present

## 2019-10-25 DIAGNOSIS — Z01818 Encounter for other preprocedural examination: Secondary | ICD-10-CM | POA: Diagnosis not present

## 2019-10-25 DIAGNOSIS — H5340 Unspecified visual field defects: Secondary | ICD-10-CM | POA: Diagnosis not present

## 2019-10-25 DIAGNOSIS — H353131 Nonexudative age-related macular degeneration, bilateral, early dry stage: Secondary | ICD-10-CM | POA: Diagnosis not present

## 2019-10-25 DIAGNOSIS — H25812 Combined forms of age-related cataract, left eye: Secondary | ICD-10-CM | POA: Diagnosis not present

## 2019-10-28 DIAGNOSIS — H5340 Unspecified visual field defects: Secondary | ICD-10-CM | POA: Diagnosis not present

## 2019-11-02 DIAGNOSIS — K219 Gastro-esophageal reflux disease without esophagitis: Secondary | ICD-10-CM | POA: Diagnosis not present

## 2019-11-02 DIAGNOSIS — G894 Chronic pain syndrome: Secondary | ICD-10-CM | POA: Diagnosis not present

## 2019-11-02 DIAGNOSIS — G4700 Insomnia, unspecified: Secondary | ICD-10-CM | POA: Diagnosis not present

## 2019-11-02 DIAGNOSIS — M810 Age-related osteoporosis without current pathological fracture: Secondary | ICD-10-CM | POA: Diagnosis not present

## 2019-11-02 DIAGNOSIS — Z6820 Body mass index (BMI) 20.0-20.9, adult: Secondary | ICD-10-CM | POA: Diagnosis not present

## 2019-11-10 DIAGNOSIS — R110 Nausea: Secondary | ICD-10-CM | POA: Diagnosis not present

## 2019-11-14 DIAGNOSIS — Z79899 Other long term (current) drug therapy: Secondary | ICD-10-CM | POA: Diagnosis not present

## 2019-11-15 DIAGNOSIS — R0683 Snoring: Secondary | ICD-10-CM | POA: Diagnosis not present

## 2019-11-15 DIAGNOSIS — H259 Unspecified age-related cataract: Secondary | ICD-10-CM | POA: Diagnosis not present

## 2019-11-15 DIAGNOSIS — K5909 Other constipation: Secondary | ICD-10-CM | POA: Diagnosis not present

## 2019-11-15 DIAGNOSIS — H25812 Combined forms of age-related cataract, left eye: Secondary | ICD-10-CM | POA: Diagnosis not present

## 2019-11-15 DIAGNOSIS — Z87891 Personal history of nicotine dependence: Secondary | ICD-10-CM | POA: Diagnosis not present

## 2019-11-15 DIAGNOSIS — K219 Gastro-esophageal reflux disease without esophagitis: Secondary | ICD-10-CM | POA: Diagnosis not present

## 2019-11-22 DIAGNOSIS — M810 Age-related osteoporosis without current pathological fracture: Secondary | ICD-10-CM | POA: Diagnosis not present

## 2019-12-14 DIAGNOSIS — K259 Gastric ulcer, unspecified as acute or chronic, without hemorrhage or perforation: Secondary | ICD-10-CM | POA: Diagnosis not present

## 2019-12-20 DIAGNOSIS — Z79899 Other long term (current) drug therapy: Secondary | ICD-10-CM | POA: Diagnosis not present

## 2019-12-20 DIAGNOSIS — M1990 Unspecified osteoarthritis, unspecified site: Secondary | ICD-10-CM | POA: Diagnosis not present

## 2019-12-20 DIAGNOSIS — Z96643 Presence of artificial hip joint, bilateral: Secondary | ICD-10-CM | POA: Diagnosis not present

## 2019-12-20 DIAGNOSIS — K219 Gastro-esophageal reflux disease without esophagitis: Secondary | ICD-10-CM | POA: Diagnosis not present

## 2019-12-20 DIAGNOSIS — H259 Unspecified age-related cataract: Secondary | ICD-10-CM | POA: Diagnosis not present

## 2019-12-20 DIAGNOSIS — Z87891 Personal history of nicotine dependence: Secondary | ICD-10-CM | POA: Diagnosis not present

## 2019-12-20 DIAGNOSIS — H25811 Combined forms of age-related cataract, right eye: Secondary | ICD-10-CM | POA: Diagnosis not present

## 2019-12-20 DIAGNOSIS — Z96652 Presence of left artificial knee joint: Secondary | ICD-10-CM | POA: Diagnosis not present

## 2020-01-02 DIAGNOSIS — G4700 Insomnia, unspecified: Secondary | ICD-10-CM | POA: Diagnosis not present

## 2020-01-02 DIAGNOSIS — K219 Gastro-esophageal reflux disease without esophagitis: Secondary | ICD-10-CM | POA: Diagnosis not present

## 2020-01-02 DIAGNOSIS — Z23 Encounter for immunization: Secondary | ICD-10-CM | POA: Diagnosis not present

## 2020-01-02 DIAGNOSIS — M810 Age-related osteoporosis without current pathological fracture: Secondary | ICD-10-CM | POA: Diagnosis not present

## 2020-01-20 DIAGNOSIS — K449 Diaphragmatic hernia without obstruction or gangrene: Secondary | ICD-10-CM | POA: Diagnosis not present

## 2020-01-20 DIAGNOSIS — K311 Adult hypertrophic pyloric stenosis: Secondary | ICD-10-CM | POA: Diagnosis not present

## 2020-01-20 DIAGNOSIS — K219 Gastro-esophageal reflux disease without esophagitis: Secondary | ICD-10-CM | POA: Diagnosis not present

## 2020-01-20 DIAGNOSIS — K259 Gastric ulcer, unspecified as acute or chronic, without hemorrhage or perforation: Secondary | ICD-10-CM | POA: Diagnosis not present

## 2020-01-20 DIAGNOSIS — K2970 Gastritis, unspecified, without bleeding: Secondary | ICD-10-CM | POA: Diagnosis not present

## 2020-01-24 DIAGNOSIS — Z79891 Long term (current) use of opiate analgesic: Secondary | ICD-10-CM | POA: Diagnosis not present

## 2020-01-24 DIAGNOSIS — M961 Postlaminectomy syndrome, not elsewhere classified: Secondary | ICD-10-CM | POA: Diagnosis not present

## 2020-01-24 DIAGNOSIS — Z5181 Encounter for therapeutic drug level monitoring: Secondary | ICD-10-CM | POA: Diagnosis not present

## 2020-01-24 DIAGNOSIS — Z79899 Other long term (current) drug therapy: Secondary | ICD-10-CM | POA: Diagnosis not present

## 2020-01-24 DIAGNOSIS — G894 Chronic pain syndrome: Secondary | ICD-10-CM | POA: Diagnosis not present

## 2020-02-06 DIAGNOSIS — Z9849 Cataract extraction status, unspecified eye: Secondary | ICD-10-CM | POA: Diagnosis not present

## 2020-02-06 DIAGNOSIS — D0439 Carcinoma in situ of skin of other parts of face: Secondary | ICD-10-CM | POA: Diagnosis not present

## 2020-02-06 DIAGNOSIS — L570 Actinic keratosis: Secondary | ICD-10-CM | POA: Diagnosis not present

## 2020-03-01 DIAGNOSIS — C44212 Basal cell carcinoma of skin of right ear and external auricular canal: Secondary | ICD-10-CM | POA: Diagnosis not present

## 2020-03-01 DIAGNOSIS — D0439 Carcinoma in situ of skin of other parts of face: Secondary | ICD-10-CM | POA: Diagnosis not present

## 2020-03-01 DIAGNOSIS — L570 Actinic keratosis: Secondary | ICD-10-CM | POA: Diagnosis not present

## 2020-03-05 DIAGNOSIS — Z79899 Other long term (current) drug therapy: Secondary | ICD-10-CM | POA: Diagnosis not present

## 2020-03-05 DIAGNOSIS — K219 Gastro-esophageal reflux disease without esophagitis: Secondary | ICD-10-CM | POA: Diagnosis not present

## 2020-03-05 DIAGNOSIS — J449 Chronic obstructive pulmonary disease, unspecified: Secondary | ICD-10-CM | POA: Diagnosis not present

## 2020-03-05 DIAGNOSIS — K589 Irritable bowel syndrome without diarrhea: Secondary | ICD-10-CM | POA: Diagnosis not present

## 2020-03-05 DIAGNOSIS — E785 Hyperlipidemia, unspecified: Secondary | ICD-10-CM | POA: Diagnosis not present

## 2020-03-19 DIAGNOSIS — K219 Gastro-esophageal reflux disease without esophagitis: Secondary | ICD-10-CM | POA: Diagnosis not present

## 2020-04-25 DIAGNOSIS — G894 Chronic pain syndrome: Secondary | ICD-10-CM | POA: Diagnosis not present

## 2020-05-08 DIAGNOSIS — T466X5A Adverse effect of antihyperlipidemic and antiarteriosclerotic drugs, initial encounter: Secondary | ICD-10-CM | POA: Diagnosis not present

## 2020-05-08 DIAGNOSIS — E785 Hyperlipidemia, unspecified: Secondary | ICD-10-CM | POA: Diagnosis not present

## 2020-05-08 DIAGNOSIS — M810 Age-related osteoporosis without current pathological fracture: Secondary | ICD-10-CM | POA: Diagnosis not present

## 2020-05-21 DIAGNOSIS — D0439 Carcinoma in situ of skin of other parts of face: Secondary | ICD-10-CM | POA: Diagnosis not present

## 2020-05-21 DIAGNOSIS — L570 Actinic keratosis: Secondary | ICD-10-CM | POA: Diagnosis not present

## 2020-05-28 DIAGNOSIS — M810 Age-related osteoporosis without current pathological fracture: Secondary | ICD-10-CM | POA: Diagnosis not present

## 2020-07-04 DIAGNOSIS — Z79899 Other long term (current) drug therapy: Secondary | ICD-10-CM | POA: Diagnosis not present

## 2020-07-04 DIAGNOSIS — Z6820 Body mass index (BMI) 20.0-20.9, adult: Secondary | ICD-10-CM | POA: Diagnosis not present

## 2020-07-04 DIAGNOSIS — J449 Chronic obstructive pulmonary disease, unspecified: Secondary | ICD-10-CM | POA: Diagnosis not present

## 2020-07-04 DIAGNOSIS — T466X5A Adverse effect of antihyperlipidemic and antiarteriosclerotic drugs, initial encounter: Secondary | ICD-10-CM | POA: Diagnosis not present

## 2020-07-04 DIAGNOSIS — M810 Age-related osteoporosis without current pathological fracture: Secondary | ICD-10-CM | POA: Diagnosis not present

## 2020-07-04 DIAGNOSIS — E785 Hyperlipidemia, unspecified: Secondary | ICD-10-CM | POA: Diagnosis not present

## 2020-07-25 DIAGNOSIS — M5459 Other low back pain: Secondary | ICD-10-CM | POA: Diagnosis not present

## 2020-09-03 DIAGNOSIS — Z6820 Body mass index (BMI) 20.0-20.9, adult: Secondary | ICD-10-CM | POA: Diagnosis not present

## 2020-09-03 DIAGNOSIS — Z1231 Encounter for screening mammogram for malignant neoplasm of breast: Secondary | ICD-10-CM | POA: Diagnosis not present

## 2020-09-03 DIAGNOSIS — M810 Age-related osteoporosis without current pathological fracture: Secondary | ICD-10-CM | POA: Diagnosis not present

## 2020-09-03 DIAGNOSIS — J449 Chronic obstructive pulmonary disease, unspecified: Secondary | ICD-10-CM | POA: Diagnosis not present

## 2020-09-03 DIAGNOSIS — K219 Gastro-esophageal reflux disease without esophagitis: Secondary | ICD-10-CM | POA: Diagnosis not present

## 2020-09-09 ENCOUNTER — Other Ambulatory Visit: Payer: Self-pay | Admitting: Oncology

## 2020-09-10 DIAGNOSIS — C44222 Squamous cell carcinoma of skin of right ear and external auricular canal: Secondary | ICD-10-CM | POA: Diagnosis not present

## 2020-09-10 DIAGNOSIS — D485 Neoplasm of uncertain behavior of skin: Secondary | ICD-10-CM | POA: Diagnosis not present

## 2020-09-12 DIAGNOSIS — Z9181 History of falling: Secondary | ICD-10-CM | POA: Diagnosis not present

## 2020-09-12 DIAGNOSIS — E785 Hyperlipidemia, unspecified: Secondary | ICD-10-CM | POA: Diagnosis not present

## 2020-09-12 DIAGNOSIS — Z0000 Encounter for general adult medical examination without abnormal findings: Secondary | ICD-10-CM | POA: Diagnosis not present

## 2020-11-01 DIAGNOSIS — M810 Age-related osteoporosis without current pathological fracture: Secondary | ICD-10-CM | POA: Diagnosis not present

## 2020-11-01 DIAGNOSIS — K219 Gastro-esophageal reflux disease without esophagitis: Secondary | ICD-10-CM | POA: Diagnosis not present

## 2020-11-01 DIAGNOSIS — Z6820 Body mass index (BMI) 20.0-20.9, adult: Secondary | ICD-10-CM | POA: Diagnosis not present

## 2020-11-01 DIAGNOSIS — E785 Hyperlipidemia, unspecified: Secondary | ICD-10-CM | POA: Diagnosis not present

## 2020-11-01 DIAGNOSIS — Z1231 Encounter for screening mammogram for malignant neoplasm of breast: Secondary | ICD-10-CM | POA: Diagnosis not present

## 2020-11-08 DIAGNOSIS — M5459 Other low back pain: Secondary | ICD-10-CM | POA: Diagnosis not present

## 2020-11-08 DIAGNOSIS — M542 Cervicalgia: Secondary | ICD-10-CM | POA: Diagnosis not present

## 2020-11-09 DIAGNOSIS — L02415 Cutaneous abscess of right lower limb: Secondary | ICD-10-CM | POA: Diagnosis not present

## 2020-11-09 DIAGNOSIS — L570 Actinic keratosis: Secondary | ICD-10-CM | POA: Diagnosis not present

## 2020-11-09 DIAGNOSIS — L97319 Non-pressure chronic ulcer of right ankle with unspecified severity: Secondary | ICD-10-CM | POA: Diagnosis not present

## 2020-11-09 DIAGNOSIS — L97911 Non-pressure chronic ulcer of unspecified part of right lower leg limited to breakdown of skin: Secondary | ICD-10-CM | POA: Diagnosis not present

## 2020-11-29 DIAGNOSIS — R922 Inconclusive mammogram: Secondary | ICD-10-CM | POA: Diagnosis not present

## 2020-11-29 DIAGNOSIS — Z853 Personal history of malignant neoplasm of breast: Secondary | ICD-10-CM | POA: Diagnosis not present

## 2020-11-29 DIAGNOSIS — R928 Other abnormal and inconclusive findings on diagnostic imaging of breast: Secondary | ICD-10-CM | POA: Diagnosis not present

## 2020-12-04 ENCOUNTER — Other Ambulatory Visit: Payer: Self-pay | Admitting: Hematology and Oncology

## 2020-12-04 MED ORDER — ANASTROZOLE 1 MG PO TABS: 1.0000 mg | ORAL_TABLET | Freq: Every day | ORAL | 3 refills | Status: DC

## 2020-12-10 DIAGNOSIS — L570 Actinic keratosis: Secondary | ICD-10-CM | POA: Diagnosis not present

## 2020-12-10 DIAGNOSIS — L97911 Non-pressure chronic ulcer of unspecified part of right lower leg limited to breakdown of skin: Secondary | ICD-10-CM | POA: Diagnosis not present

## 2020-12-10 DIAGNOSIS — L0390 Cellulitis, unspecified: Secondary | ICD-10-CM | POA: Diagnosis not present

## 2021-01-01 DIAGNOSIS — Z23 Encounter for immunization: Secondary | ICD-10-CM | POA: Diagnosis not present

## 2021-01-01 DIAGNOSIS — K219 Gastro-esophageal reflux disease without esophagitis: Secondary | ICD-10-CM | POA: Diagnosis not present

## 2021-01-01 DIAGNOSIS — M810 Age-related osteoporosis without current pathological fracture: Secondary | ICD-10-CM | POA: Diagnosis not present

## 2021-01-01 DIAGNOSIS — E785 Hyperlipidemia, unspecified: Secondary | ICD-10-CM | POA: Diagnosis not present

## 2021-01-01 DIAGNOSIS — Z6820 Body mass index (BMI) 20.0-20.9, adult: Secondary | ICD-10-CM | POA: Diagnosis not present

## 2021-01-01 DIAGNOSIS — G4700 Insomnia, unspecified: Secondary | ICD-10-CM | POA: Diagnosis not present

## 2021-01-01 DIAGNOSIS — Z79899 Other long term (current) drug therapy: Secondary | ICD-10-CM | POA: Diagnosis not present

## 2021-01-11 DIAGNOSIS — M810 Age-related osteoporosis without current pathological fracture: Secondary | ICD-10-CM | POA: Diagnosis not present

## 2021-01-22 DIAGNOSIS — Z79891 Long term (current) use of opiate analgesic: Secondary | ICD-10-CM | POA: Diagnosis not present

## 2021-01-22 DIAGNOSIS — G894 Chronic pain syndrome: Secondary | ICD-10-CM | POA: Diagnosis not present

## 2021-01-22 DIAGNOSIS — M961 Postlaminectomy syndrome, not elsewhere classified: Secondary | ICD-10-CM | POA: Diagnosis not present

## 2021-01-22 DIAGNOSIS — Z5181 Encounter for therapeutic drug level monitoring: Secondary | ICD-10-CM | POA: Diagnosis not present

## 2021-02-13 ENCOUNTER — Telehealth: Payer: Self-pay

## 2021-02-13 ENCOUNTER — Other Ambulatory Visit: Payer: Self-pay | Admitting: Hematology and Oncology

## 2021-02-13 DIAGNOSIS — C50412 Malignant neoplasm of upper-outer quadrant of left female breast: Secondary | ICD-10-CM

## 2021-02-13 NOTE — Telephone Encounter (Signed)
I called pt and told her that the provider is refilling her anastrozole for 30 days. He needs to see her in clinic before giving more. Pt agreed, appt made.

## 2021-02-24 NOTE — Progress Notes (Signed)
[facility]  [address] [place],  [place]  [address] [phone]  Clinic Day:  02/25/2021  Referring physician: Janine Limbo, PA-C   HISTORY OF PRESENT ILLNESS:  The patient is a 70 y.o. female with  stage IA (T1c N0 M0) hormone positive breast cancer, status post a left breast lumpectomy in August 2018.  As Oncotype testing showed her to have a low risk of distant breast cancer recurrence, adjuvant chemotherapy was not given.  She did complete adjuvant breast radiation.  She currently takes anastrozole on a daily basis for the adjuvant endocrine management of her breast cancer.  Since her last visit, the patient has been doing well.  She denies having any particular breast changes which concern her for disease recurrence.  Of note, her annual mammogram in September 2022 showed no evidence of disease recurrence.  PHYSICAL EXAM:  Blood pressure (!) 148/71, pulse 100, temperature 98.6 F (37 C), resp. rate 16, height 5\' 5"  (1.651 m), weight 128 lb 8 oz (58.3 kg), SpO2 97 %. Wt Readings from Last 3 Encounters:  02/25/21 128 lb 8 oz (58.3 kg)  06/27/16 149 lb 0.5 oz (67.6 kg)  06/23/16 (P) 149 lb (67.6 kg)   Body mass index is 21.38 kg/m. Performance status (ECOG): 1 - Symptomatic but completely ambulatory Physical Exam Constitutional:      Appearance: Normal appearance.  HENT:     Mouth/Throat:     Pharynx: Oropharynx is clear. No oropharyngeal exudate.  Cardiovascular:     Rate and Rhythm: Normal rate and regular rhythm.     Heart sounds: No murmur heard.   No friction rub. No gallop.  Pulmonary:     Breath sounds: Normal breath sounds.  Chest:  Breasts:    Right: No swelling, bleeding, inverted nipple, mass, nipple discharge or skin change.     Left: No swelling, bleeding, inverted nipple, mass, nipple discharge or skin change.  Abdominal:     General: Bowel sounds are normal. There is no distension.     Palpations: Abdomen is soft.  There is no mass.     Tenderness: There is no abdominal tenderness.  Musculoskeletal:        General: No tenderness.     Cervical back: Normal range of motion and neck supple.     Right lower leg: No edema.     Left lower leg: No edema.  Lymphadenopathy:     Cervical: No cervical adenopathy.     Right cervical: No superficial, deep or posterior cervical adenopathy.    Left cervical: No superficial, deep or posterior cervical adenopathy.     Upper Body:     Right upper body: No supraclavicular or axillary adenopathy.     Left upper body: No supraclavicular or axillary adenopathy.     Lower Body: No right inguinal adenopathy. No left inguinal adenopathy.  Skin:    Coloration: Skin is not jaundiced.     Findings: No lesion or rash.  Neurological:     General: No focal deficit present.     Mental Status: She is alert and oriented to person, place, and time. Mental status is at baseline.  Psychiatric:        Mood and Affect: Mood normal.        Behavior: Behavior normal.        Thought Content: Thought content normal.        Judgment: Judgment normal.   STUDIES:  Diagnostic Mammogram 11-29-20  FINDINGS:  Right  breast: No suspicious mass, distortion, or microcalcifications  are identified to suggest presence of malignancy.  Left breast: A spot 2D magnification view of the lumpectomy site was  performed in addition to standard views. There are stable  postsurgical changes. No suspicious mass, distortion, or  microcalcifications are identified to suggest presence of  malignancy.   IMPRESSION:  Stable postsurgical changes in the left   ASSESSMENT & PLAN:  Assessment/Plan:  A 70 y.o. female with stage IA hormone positive breast cancer, status post a left breast lumpectomy in August 2018.  She also completed adjuvant breast radiation.  Based upon her clinical breast exam today and her recent annual mammogram, the patient remains disease free.  She knows to continue to take anastrozole  daily for her 5 years for her adjuvant endocrine therapy.  I will see her back in 6 months for a repeat clinical breast exam.  The patient understands all the plans discussed today and is in agreement with them.    Verlin Duke Macarthur Critchley, MD

## 2021-02-25 ENCOUNTER — Telehealth: Payer: Self-pay | Admitting: Oncology

## 2021-02-25 ENCOUNTER — Inpatient Hospital Stay: Payer: Medicare Other | Attending: Oncology | Admitting: Oncology

## 2021-02-25 DIAGNOSIS — Z170 Estrogen receptor positive status [ER+]: Secondary | ICD-10-CM | POA: Diagnosis not present

## 2021-02-25 DIAGNOSIS — C50412 Malignant neoplasm of upper-outer quadrant of left female breast: Secondary | ICD-10-CM

## 2021-02-25 DIAGNOSIS — C50919 Malignant neoplasm of unspecified site of unspecified female breast: Secondary | ICD-10-CM | POA: Insufficient documentation

## 2021-02-25 NOTE — Telephone Encounter (Signed)
Per 12/5 los next appt scheduled and given to patient 

## 2021-03-04 DIAGNOSIS — Z6821 Body mass index (BMI) 21.0-21.9, adult: Secondary | ICD-10-CM | POA: Diagnosis not present

## 2021-03-04 DIAGNOSIS — K219 Gastro-esophageal reflux disease without esophagitis: Secondary | ICD-10-CM | POA: Diagnosis not present

## 2021-03-04 DIAGNOSIS — M810 Age-related osteoporosis without current pathological fracture: Secondary | ICD-10-CM | POA: Diagnosis not present

## 2021-03-04 DIAGNOSIS — J449 Chronic obstructive pulmonary disease, unspecified: Secondary | ICD-10-CM | POA: Diagnosis not present

## 2021-03-12 ENCOUNTER — Other Ambulatory Visit: Payer: Self-pay | Admitting: Hematology and Oncology

## 2021-03-12 DIAGNOSIS — Z170 Estrogen receptor positive status [ER+]: Secondary | ICD-10-CM

## 2021-04-23 DIAGNOSIS — E785 Hyperlipidemia, unspecified: Secondary | ICD-10-CM | POA: Diagnosis not present

## 2021-04-23 DIAGNOSIS — J449 Chronic obstructive pulmonary disease, unspecified: Secondary | ICD-10-CM | POA: Diagnosis not present

## 2021-05-06 DIAGNOSIS — J449 Chronic obstructive pulmonary disease, unspecified: Secondary | ICD-10-CM | POA: Diagnosis not present

## 2021-05-06 DIAGNOSIS — K219 Gastro-esophageal reflux disease without esophagitis: Secondary | ICD-10-CM | POA: Diagnosis not present

## 2021-05-06 DIAGNOSIS — Z6821 Body mass index (BMI) 21.0-21.9, adult: Secondary | ICD-10-CM | POA: Diagnosis not present

## 2021-05-06 DIAGNOSIS — J309 Allergic rhinitis, unspecified: Secondary | ICD-10-CM | POA: Diagnosis not present

## 2021-05-23 DIAGNOSIS — M961 Postlaminectomy syndrome, not elsewhere classified: Secondary | ICD-10-CM | POA: Diagnosis not present

## 2021-06-20 ENCOUNTER — Other Ambulatory Visit: Payer: Self-pay | Admitting: Hematology and Oncology

## 2021-06-20 DIAGNOSIS — C50412 Malignant neoplasm of upper-outer quadrant of left female breast: Secondary | ICD-10-CM

## 2021-07-04 DIAGNOSIS — G4700 Insomnia, unspecified: Secondary | ICD-10-CM | POA: Diagnosis not present

## 2021-07-04 DIAGNOSIS — Z79899 Other long term (current) drug therapy: Secondary | ICD-10-CM | POA: Diagnosis not present

## 2021-07-04 DIAGNOSIS — E785 Hyperlipidemia, unspecified: Secondary | ICD-10-CM | POA: Diagnosis not present

## 2021-07-04 DIAGNOSIS — J449 Chronic obstructive pulmonary disease, unspecified: Secondary | ICD-10-CM | POA: Diagnosis not present

## 2021-07-04 DIAGNOSIS — Z6821 Body mass index (BMI) 21.0-21.9, adult: Secondary | ICD-10-CM | POA: Diagnosis not present

## 2021-07-18 DIAGNOSIS — M810 Age-related osteoporosis without current pathological fracture: Secondary | ICD-10-CM | POA: Diagnosis not present

## 2021-08-25 NOTE — Progress Notes (Signed)
[facility]  [address] [place],  [place]  [address] [phone]  Clinic Day:  08/26/2021  Referring physician: Janine Limbo, PA-C   HISTORY OF PRESENT ILLNESS:  The patient is a 71 y.o. female with  stage IA (T1c N0 M0) hormone positive breast cancer, status post a left breast lumpectomy in August 2018.  As Oncotype testing showed her to have a low risk of distant breast cancer recurrence, adjuvant chemotherapy was not given.  She did complete adjuvant breast radiation.  She currently takes anastrozole on a daily basis for her adjuvant endocrine therapy.  She comes in today for routine follow-up.  Since her last visit, the patient has been doing well.  She denies having any particular breast changes which concern her for disease recurrence.    PHYSICAL EXAM:  Blood pressure (!) 144/67, pulse 83, temperature 97.7 F (36.5 C), resp. rate 14, height '5\' 5"'$  (1.651 m), weight 128 lb 14.4 oz (58.5 kg), SpO2 95 %. Wt Readings from Last 3 Encounters:  08/26/21 128 lb 14.4 oz (58.5 kg)  02/25/21 128 lb 8 oz (58.3 kg)  06/27/16 149 lb 0.5 oz (67.6 kg)   Body mass index is 21.45 kg/m. Performance status (ECOG): 1 - Symptomatic but completely ambulatory Physical Exam Constitutional:      Appearance: Normal appearance.  HENT:     Mouth/Throat:     Pharynx: Oropharynx is clear. No oropharyngeal exudate.  Cardiovascular:     Rate and Rhythm: Normal rate and regular rhythm.     Heart sounds: No murmur heard.   No friction rub. No gallop.  Pulmonary:     Breath sounds: Normal breath sounds.  Chest:  Breasts:    Right: No swelling, bleeding, inverted nipple, mass, nipple discharge or skin change.     Left: No swelling, bleeding, inverted nipple, mass, nipple discharge or skin change.  Abdominal:     General: Bowel sounds are normal. There is no distension.     Palpations: Abdomen is soft. There is no mass.     Tenderness: There is no abdominal  tenderness.  Musculoskeletal:        General: No tenderness.     Cervical back: Normal range of motion and neck supple.     Right lower leg: No edema.     Left lower leg: No edema.  Lymphadenopathy:     Cervical: No cervical adenopathy.     Right cervical: No superficial, deep or posterior cervical adenopathy.    Left cervical: No superficial, deep or posterior cervical adenopathy.     Upper Body:     Right upper body: No supraclavicular or axillary adenopathy.     Left upper body: No supraclavicular or axillary adenopathy.     Lower Body: No right inguinal adenopathy. No left inguinal adenopathy.  Skin:    Coloration: Skin is not jaundiced.     Findings: No lesion or rash.  Neurological:     General: No focal deficit present.     Mental Status: She is alert and oriented to person, place, and time. Mental status is at baseline.  Psychiatric:        Mood and Affect: Mood normal.        Behavior: Behavior normal.        Thought Content: Thought content normal.        Judgment: Judgment normal.   ASSESSMENT & PLAN:  Assessment/Plan:  A 71 y.o. female with stage IA hormone positive breast cancer,  status post a left breast lumpectomy in August 2018.  She also completed adjuvant breast radiation.  Based upon her clinical breast exam today, the patient remains disease free.  She knows to continue taking her anastrozole daily, for which she is nearing the completion of her 5 years of adjuvant endocrine therapy.  I will see her back in 6 months for a repeat clinical breast exam.  Her annual mammogram will be scheduled before her next visit for her continued radiographic breast cancer surveillance.  If her next clinical breast exam and mammogram are both normal, I will begin spacing all future appointments out to once per year.  The patient understands all the plans discussed today and is in agreement with them.  Mahalie Kanner Macarthur Critchley, MD

## 2021-08-26 ENCOUNTER — Other Ambulatory Visit: Payer: Self-pay | Admitting: Oncology

## 2021-08-26 ENCOUNTER — Inpatient Hospital Stay: Payer: Medicare Other | Attending: Oncology | Admitting: Oncology

## 2021-08-26 VITALS — BP 144/67 | HR 83 | Temp 97.7°F | Resp 14 | Ht 65.0 in | Wt 128.9 lb

## 2021-08-26 DIAGNOSIS — Z170 Estrogen receptor positive status [ER+]: Secondary | ICD-10-CM

## 2021-08-26 DIAGNOSIS — C50011 Malignant neoplasm of nipple and areola, right female breast: Secondary | ICD-10-CM

## 2021-08-26 DIAGNOSIS — C50012 Malignant neoplasm of nipple and areola, left female breast: Secondary | ICD-10-CM | POA: Diagnosis not present

## 2021-08-27 ENCOUNTER — Telehealth: Payer: Self-pay | Admitting: Oncology

## 2021-08-27 NOTE — Telephone Encounter (Signed)
08/27/21 left msg -next appt scheduled.Mammogram order sent to scheduling.

## 2021-09-03 DIAGNOSIS — Z6821 Body mass index (BMI) 21.0-21.9, adult: Secondary | ICD-10-CM | POA: Diagnosis not present

## 2021-09-03 DIAGNOSIS — K219 Gastro-esophageal reflux disease without esophagitis: Secondary | ICD-10-CM | POA: Diagnosis not present

## 2021-09-03 DIAGNOSIS — G4700 Insomnia, unspecified: Secondary | ICD-10-CM | POA: Diagnosis not present

## 2021-09-03 DIAGNOSIS — J449 Chronic obstructive pulmonary disease, unspecified: Secondary | ICD-10-CM | POA: Diagnosis not present

## 2021-09-17 DIAGNOSIS — Z139 Encounter for screening, unspecified: Secondary | ICD-10-CM | POA: Diagnosis not present

## 2021-09-17 DIAGNOSIS — E785 Hyperlipidemia, unspecified: Secondary | ICD-10-CM | POA: Diagnosis not present

## 2021-09-17 DIAGNOSIS — Z0000 Encounter for general adult medical examination without abnormal findings: Secondary | ICD-10-CM | POA: Diagnosis not present

## 2021-09-17 DIAGNOSIS — Z9181 History of falling: Secondary | ICD-10-CM | POA: Diagnosis not present

## 2021-09-26 DIAGNOSIS — M961 Postlaminectomy syndrome, not elsewhere classified: Secondary | ICD-10-CM | POA: Diagnosis not present

## 2021-09-26 DIAGNOSIS — G894 Chronic pain syndrome: Secondary | ICD-10-CM | POA: Diagnosis not present

## 2021-09-26 DIAGNOSIS — M5459 Other low back pain: Secondary | ICD-10-CM | POA: Diagnosis not present

## 2021-10-01 ENCOUNTER — Telehealth: Payer: Self-pay

## 2021-10-01 NOTE — Telephone Encounter (Signed)
-  patient called wanting anastozole refilled called [facility] one refill remainied they will fill this . Patient aware.

## 2021-10-15 DIAGNOSIS — L570 Actinic keratosis: Secondary | ICD-10-CM | POA: Diagnosis not present

## 2021-10-15 DIAGNOSIS — L578 Other skin changes due to chronic exposure to nonionizing radiation: Secondary | ICD-10-CM | POA: Diagnosis not present

## 2021-10-15 DIAGNOSIS — L821 Other seborrheic keratosis: Secondary | ICD-10-CM | POA: Diagnosis not present

## 2021-11-12 DIAGNOSIS — G4700 Insomnia, unspecified: Secondary | ICD-10-CM | POA: Diagnosis not present

## 2021-11-12 DIAGNOSIS — E785 Hyperlipidemia, unspecified: Secondary | ICD-10-CM | POA: Diagnosis not present

## 2021-11-12 DIAGNOSIS — Z6821 Body mass index (BMI) 21.0-21.9, adult: Secondary | ICD-10-CM | POA: Diagnosis not present

## 2021-12-24 DIAGNOSIS — Z1231 Encounter for screening mammogram for malignant neoplasm of breast: Secondary | ICD-10-CM | POA: Diagnosis not present

## 2022-01-13 DIAGNOSIS — E785 Hyperlipidemia, unspecified: Secondary | ICD-10-CM | POA: Diagnosis not present

## 2022-01-13 DIAGNOSIS — Z6822 Body mass index (BMI) 22.0-22.9, adult: Secondary | ICD-10-CM | POA: Diagnosis not present

## 2022-01-13 DIAGNOSIS — G4700 Insomnia, unspecified: Secondary | ICD-10-CM | POA: Diagnosis not present

## 2022-01-13 DIAGNOSIS — Z23 Encounter for immunization: Secondary | ICD-10-CM | POA: Diagnosis not present

## 2022-01-13 DIAGNOSIS — Z79899 Other long term (current) drug therapy: Secondary | ICD-10-CM | POA: Diagnosis not present

## 2022-01-13 DIAGNOSIS — J449 Chronic obstructive pulmonary disease, unspecified: Secondary | ICD-10-CM | POA: Diagnosis not present

## 2022-01-13 DIAGNOSIS — K589 Irritable bowel syndrome without diarrhea: Secondary | ICD-10-CM | POA: Diagnosis not present

## 2022-01-16 ENCOUNTER — Encounter: Payer: Self-pay | Admitting: Oncology

## 2022-01-16 DIAGNOSIS — M810 Age-related osteoporosis without current pathological fracture: Secondary | ICD-10-CM | POA: Diagnosis not present

## 2022-01-27 DIAGNOSIS — Z79891 Long term (current) use of opiate analgesic: Secondary | ICD-10-CM | POA: Diagnosis not present

## 2022-01-27 DIAGNOSIS — M961 Postlaminectomy syndrome, not elsewhere classified: Secondary | ICD-10-CM | POA: Diagnosis not present

## 2022-01-27 DIAGNOSIS — M5459 Other low back pain: Secondary | ICD-10-CM | POA: Diagnosis not present

## 2022-01-27 DIAGNOSIS — Z5181 Encounter for therapeutic drug level monitoring: Secondary | ICD-10-CM | POA: Diagnosis not present

## 2022-01-27 DIAGNOSIS — Z79899 Other long term (current) drug therapy: Secondary | ICD-10-CM | POA: Diagnosis not present

## 2022-01-27 DIAGNOSIS — G894 Chronic pain syndrome: Secondary | ICD-10-CM | POA: Diagnosis not present

## 2022-02-17 DIAGNOSIS — L570 Actinic keratosis: Secondary | ICD-10-CM | POA: Diagnosis not present

## 2022-02-17 DIAGNOSIS — C44722 Squamous cell carcinoma of skin of right lower limb, including hip: Secondary | ICD-10-CM | POA: Diagnosis not present

## 2022-02-17 DIAGNOSIS — D0439 Carcinoma in situ of skin of other parts of face: Secondary | ICD-10-CM | POA: Diagnosis not present

## 2022-02-25 ENCOUNTER — Inpatient Hospital Stay: Payer: Medicare Other | Attending: Oncology | Admitting: Oncology

## 2022-02-25 ENCOUNTER — Other Ambulatory Visit: Payer: Self-pay | Admitting: Oncology

## 2022-02-25 VITALS — BP 151/80 | HR 94 | Temp 98.6°F | Resp 18 | Ht 65.0 in | Wt 133.8 lb

## 2022-02-25 DIAGNOSIS — Z170 Estrogen receptor positive status [ER+]: Secondary | ICD-10-CM

## 2022-02-25 DIAGNOSIS — C50412 Malignant neoplasm of upper-outer quadrant of left female breast: Secondary | ICD-10-CM | POA: Diagnosis not present

## 2022-02-25 NOTE — Progress Notes (Signed)
[facility]  [address] [place],  [place]  [address] [phone]  Clinic Day:  02/25/2022  Referring physician: Janine Limbo, PA-C   HISTORY OF PRESENT ILLNESS:  The patient is a 71 y.o. female with  stage IA (T1c N0 M0) hormone positive breast cancer, status post a left breast lumpectomy in August 2018.  As Oncotype testing showed her to have a low risk of distant breast cancer recurrence, adjuvant chemotherapy was not given.  She did complete adjuvant breast radiation.  She recently completed her 5 years of anastrozole for her adjuvant endocrine therapy in September 2023.  She comes in today for routine follow-up.  Since her last visit, the patient has been doing well.  She denies having any particular breast changes which concern her for disease recurrence.  Of note, her annual mammogram in October 2023 continued to show no evidence of disease recurrence.    PHYSICAL EXAM:  Blood pressure (!) 151/80, pulse 94, temperature 98.6 F (37 C), temperature source Oral, resp. rate 18, height '5\' 5"'$  (1.651 m), weight 133 lb 12.8 oz (60.7 kg), SpO2 94 %. Wt Readings from Last 3 Encounters:  02/25/22 133 lb 12.8 oz (60.7 kg)  08/26/21 128 lb 14.4 oz (58.5 kg)  02/25/21 128 lb 8 oz (58.3 kg)   Body mass index is 22.27 kg/m. Performance status (ECOG): 1 - Symptomatic but completely ambulatory Physical Exam Constitutional:      Appearance: Normal appearance.  HENT:     Mouth/Throat:     Pharynx: Oropharynx is clear. No oropharyngeal exudate.  Cardiovascular:     Rate and Rhythm: Normal rate and regular rhythm.     Heart sounds: No murmur heard.    No friction rub. No gallop.  Pulmonary:     Breath sounds: Normal breath sounds.  Chest:  Breasts:    Right: No swelling, bleeding, inverted nipple, mass, nipple discharge or skin change.     Left: No swelling, bleeding, inverted nipple, mass, nipple discharge or skin change.  Abdominal:     General:  Bowel sounds are normal. There is no distension.     Palpations: Abdomen is soft. There is no mass.     Tenderness: There is no abdominal tenderness.  Musculoskeletal:        General: No tenderness.     Cervical back: Normal range of motion and neck supple.     Right lower leg: No edema.     Left lower leg: No edema.  Lymphadenopathy:     Cervical: No cervical adenopathy.     Right cervical: No superficial, deep or posterior cervical adenopathy.    Left cervical: No superficial, deep or posterior cervical adenopathy.     Upper Body:     Right upper body: No supraclavicular or axillary adenopathy.     Left upper body: No supraclavicular or axillary adenopathy.     Lower Body: No right inguinal adenopathy. No left inguinal adenopathy.  Skin:    Coloration: Skin is not jaundiced.     Findings: No lesion or rash.  Neurological:     General: No focal deficit present.     Mental Status: She is alert and oriented to person, place, and time. Mental status is at baseline.  Psychiatric:        Mood and Affect: Mood normal.        Behavior: Behavior normal.        Thought Content: Thought content normal.  Judgment: Judgment normal.    ASSESSMENT & PLAN:  Assessment/Plan:  A 71 y.o. female with stage IA hormone positive breast cancer, status post a left breast lumpectomy in August 2018.  She also completed adjuvant breast radiation and her 5 years of adjuvant endocrine therapy.  Based upon her clinical breast exam today and her recent mammogram, the patient remains disease free.  Clinically, the patient appears to be doing well.  I do feel comfortable spacing her appointments out to once per year.  I will ensure her annual mammogram is done in October 2024 for her continued radiographic breast cancer surveillance.  The patient understands all the plans discussed today and is in agreement with them.  Renea Schoonmaker Macarthur Critchley, MD

## 2022-02-26 ENCOUNTER — Telehealth: Payer: Self-pay | Admitting: Oncology

## 2022-02-26 NOTE — Telephone Encounter (Signed)
02/26/22 Next appt scheduled and confirmed with patient

## 2022-02-27 DIAGNOSIS — D485 Neoplasm of uncertain behavior of skin: Secondary | ICD-10-CM | POA: Diagnosis not present

## 2022-02-27 DIAGNOSIS — D0439 Carcinoma in situ of skin of other parts of face: Secondary | ICD-10-CM | POA: Diagnosis not present

## 2022-03-10 DIAGNOSIS — L0201 Cutaneous abscess of face: Secondary | ICD-10-CM | POA: Diagnosis not present

## 2022-03-13 DIAGNOSIS — Z6821 Body mass index (BMI) 21.0-21.9, adult: Secondary | ICD-10-CM | POA: Diagnosis not present

## 2022-03-13 DIAGNOSIS — G4700 Insomnia, unspecified: Secondary | ICD-10-CM | POA: Diagnosis not present

## 2022-04-28 DIAGNOSIS — H61001 Unspecified perichondritis of right external ear: Secondary | ICD-10-CM | POA: Diagnosis not present

## 2022-04-28 DIAGNOSIS — C44319 Basal cell carcinoma of skin of other parts of face: Secondary | ICD-10-CM | POA: Diagnosis not present

## 2022-04-28 DIAGNOSIS — L570 Actinic keratosis: Secondary | ICD-10-CM | POA: Diagnosis not present

## 2022-05-21 DIAGNOSIS — Z6821 Body mass index (BMI) 21.0-21.9, adult: Secondary | ICD-10-CM | POA: Diagnosis not present

## 2022-05-21 DIAGNOSIS — K219 Gastro-esophageal reflux disease without esophagitis: Secondary | ICD-10-CM | POA: Diagnosis not present

## 2022-05-21 DIAGNOSIS — G4700 Insomnia, unspecified: Secondary | ICD-10-CM | POA: Diagnosis not present

## 2022-07-31 DIAGNOSIS — M810 Age-related osteoporosis without current pathological fracture: Secondary | ICD-10-CM | POA: Diagnosis not present

## 2022-08-04 DIAGNOSIS — D0439 Carcinoma in situ of skin of other parts of face: Secondary | ICD-10-CM | POA: Diagnosis not present

## 2022-08-04 DIAGNOSIS — H61001 Unspecified perichondritis of right external ear: Secondary | ICD-10-CM | POA: Diagnosis not present

## 2022-08-04 DIAGNOSIS — L570 Actinic keratosis: Secondary | ICD-10-CM | POA: Diagnosis not present

## 2022-08-25 DIAGNOSIS — L02415 Cutaneous abscess of right lower limb: Secondary | ICD-10-CM | POA: Diagnosis not present

## 2022-08-25 DIAGNOSIS — R531 Weakness: Secondary | ICD-10-CM | POA: Diagnosis not present

## 2022-08-25 DIAGNOSIS — D485 Neoplasm of uncertain behavior of skin: Secondary | ICD-10-CM | POA: Diagnosis not present

## 2022-08-25 DIAGNOSIS — L281 Prurigo nodularis: Secondary | ICD-10-CM | POA: Diagnosis not present

## 2022-08-28 DIAGNOSIS — H524 Presbyopia: Secondary | ICD-10-CM | POA: Diagnosis not present

## 2022-08-28 DIAGNOSIS — Z9841 Cataract extraction status, right eye: Secondary | ICD-10-CM | POA: Diagnosis not present

## 2022-08-28 DIAGNOSIS — Z961 Presence of intraocular lens: Secondary | ICD-10-CM | POA: Diagnosis not present

## 2022-09-12 DIAGNOSIS — D485 Neoplasm of uncertain behavior of skin: Secondary | ICD-10-CM | POA: Diagnosis not present

## 2022-09-19 DIAGNOSIS — L02415 Cutaneous abscess of right lower limb: Secondary | ICD-10-CM | POA: Diagnosis not present

## 2022-09-19 DIAGNOSIS — L97911 Non-pressure chronic ulcer of unspecified part of right lower leg limited to breakdown of skin: Secondary | ICD-10-CM | POA: Diagnosis not present

## 2022-09-29 DIAGNOSIS — M62830 Muscle spasm of back: Secondary | ICD-10-CM | POA: Diagnosis not present

## 2022-09-29 DIAGNOSIS — G894 Chronic pain syndrome: Secondary | ICD-10-CM | POA: Diagnosis not present

## 2022-09-29 DIAGNOSIS — Z79891 Long term (current) use of opiate analgesic: Secondary | ICD-10-CM | POA: Diagnosis not present

## 2022-09-29 DIAGNOSIS — M961 Postlaminectomy syndrome, not elsewhere classified: Secondary | ICD-10-CM | POA: Diagnosis not present

## 2022-10-01 DIAGNOSIS — G4700 Insomnia, unspecified: Secondary | ICD-10-CM | POA: Diagnosis not present

## 2022-10-01 DIAGNOSIS — M25579 Pain in unspecified ankle and joints of unspecified foot: Secondary | ICD-10-CM | POA: Diagnosis not present

## 2022-10-01 DIAGNOSIS — Z6821 Body mass index (BMI) 21.0-21.9, adult: Secondary | ICD-10-CM | POA: Diagnosis not present

## 2022-10-02 DIAGNOSIS — L97911 Non-pressure chronic ulcer of unspecified part of right lower leg limited to breakdown of skin: Secondary | ICD-10-CM | POA: Diagnosis not present

## 2022-10-09 DIAGNOSIS — L97911 Non-pressure chronic ulcer of unspecified part of right lower leg limited to breakdown of skin: Secondary | ICD-10-CM | POA: Diagnosis not present

## 2022-10-23 DIAGNOSIS — L02415 Cutaneous abscess of right lower limb: Secondary | ICD-10-CM | POA: Diagnosis not present

## 2022-10-23 DIAGNOSIS — L0291 Cutaneous abscess, unspecified: Secondary | ICD-10-CM | POA: Diagnosis not present

## 2022-10-29 DIAGNOSIS — L97911 Non-pressure chronic ulcer of unspecified part of right lower leg limited to breakdown of skin: Secondary | ICD-10-CM | POA: Diagnosis not present

## 2022-11-05 DIAGNOSIS — L97911 Non-pressure chronic ulcer of unspecified part of right lower leg limited to breakdown of skin: Secondary | ICD-10-CM | POA: Diagnosis not present

## 2022-12-01 DIAGNOSIS — Z6821 Body mass index (BMI) 21.0-21.9, adult: Secondary | ICD-10-CM | POA: Diagnosis not present

## 2022-12-01 DIAGNOSIS — G4700 Insomnia, unspecified: Secondary | ICD-10-CM | POA: Diagnosis not present

## 2022-12-01 DIAGNOSIS — E785 Hyperlipidemia, unspecified: Secondary | ICD-10-CM | POA: Diagnosis not present

## 2022-12-16 DIAGNOSIS — L02415 Cutaneous abscess of right lower limb: Secondary | ICD-10-CM | POA: Diagnosis not present

## 2023-01-03 DIAGNOSIS — L02415 Cutaneous abscess of right lower limb: Secondary | ICD-10-CM | POA: Diagnosis not present

## 2023-01-12 DIAGNOSIS — Z1231 Encounter for screening mammogram for malignant neoplasm of breast: Secondary | ICD-10-CM | POA: Diagnosis not present

## 2023-01-12 LAB — HM MAMMOGRAPHY

## 2023-01-27 DIAGNOSIS — Z23 Encounter for immunization: Secondary | ICD-10-CM | POA: Diagnosis not present

## 2023-01-27 DIAGNOSIS — Z6821 Body mass index (BMI) 21.0-21.9, adult: Secondary | ICD-10-CM | POA: Diagnosis not present

## 2023-01-27 DIAGNOSIS — Z9181 History of falling: Secondary | ICD-10-CM | POA: Diagnosis not present

## 2023-01-27 DIAGNOSIS — E785 Hyperlipidemia, unspecified: Secondary | ICD-10-CM | POA: Diagnosis not present

## 2023-01-27 DIAGNOSIS — Z79899 Other long term (current) drug therapy: Secondary | ICD-10-CM | POA: Diagnosis not present

## 2023-01-27 DIAGNOSIS — J449 Chronic obstructive pulmonary disease, unspecified: Secondary | ICD-10-CM | POA: Diagnosis not present

## 2023-01-27 DIAGNOSIS — Z139 Encounter for screening, unspecified: Secondary | ICD-10-CM | POA: Diagnosis not present

## 2023-02-02 ENCOUNTER — Encounter: Payer: Self-pay | Admitting: Oncology

## 2023-02-03 DIAGNOSIS — Z5181 Encounter for therapeutic drug level monitoring: Secondary | ICD-10-CM | POA: Diagnosis not present

## 2023-02-03 DIAGNOSIS — M25571 Pain in right ankle and joints of right foot: Secondary | ICD-10-CM | POA: Diagnosis not present

## 2023-02-03 DIAGNOSIS — Z79899 Other long term (current) drug therapy: Secondary | ICD-10-CM | POA: Diagnosis not present

## 2023-02-03 DIAGNOSIS — Z79891 Long term (current) use of opiate analgesic: Secondary | ICD-10-CM | POA: Diagnosis not present

## 2023-02-03 DIAGNOSIS — M5459 Other low back pain: Secondary | ICD-10-CM | POA: Diagnosis not present

## 2023-02-03 DIAGNOSIS — G894 Chronic pain syndrome: Secondary | ICD-10-CM | POA: Diagnosis not present

## 2023-02-03 DIAGNOSIS — M961 Postlaminectomy syndrome, not elsewhere classified: Secondary | ICD-10-CM | POA: Diagnosis not present

## 2023-02-04 DIAGNOSIS — Z0000 Encounter for general adult medical examination without abnormal findings: Secondary | ICD-10-CM | POA: Diagnosis not present

## 2023-02-04 DIAGNOSIS — Z9181 History of falling: Secondary | ICD-10-CM | POA: Diagnosis not present

## 2023-02-12 DIAGNOSIS — M810 Age-related osteoporosis without current pathological fracture: Secondary | ICD-10-CM | POA: Diagnosis not present

## 2023-02-25 NOTE — Progress Notes (Unsigned)
[facility]  [address] [place],  [place]  [address] [phone]  Clinic Day:  02/25/2023  Referring physician: Eunice Blase, PA-C   HISTORY OF PRESENT ILLNESS:  The patient is a 72 y.o. female with  stage IA (T1c N0 M0) hormone positive breast cancer, status post a left breast lumpectomy in August 2018.  As Oncotype testing showed her to have a low risk of distant breast cancer recurrence, adjuvant chemotherapy was not given.  She did complete adjuvant breast radiation.  She recently completed her 5 years of anastrozole for her adjuvant endocrine therapy in September 2023.  She comes in today for routine follow-up.  Since her last visit, the patient has been doing well.  She denies having any particular breast changes which concern her for disease recurrence.  Of note, her annual mammogram in October 2024 continued to show no evidence of disease recurrence.    PHYSICAL EXAM:  There were no vitals taken for this visit. Wt Readings from Last 3 Encounters:  02/25/22 133 lb 12.8 oz (60.7 kg)  08/26/21 128 lb 14.4 oz (58.5 kg)  02/25/21 128 lb 8 oz (58.3 kg)   There is no height or weight on file to calculate BMI. Performance status (ECOG): 1 - Symptomatic but completely ambulatory Physical Exam Constitutional:      Appearance: Normal appearance.  HENT:     Mouth/Throat:     Pharynx: Oropharynx is clear. No oropharyngeal exudate.  Cardiovascular:     Rate and Rhythm: Normal rate and regular rhythm.     Heart sounds: No murmur heard.    No friction rub. No gallop.  Pulmonary:     Breath sounds: Normal breath sounds.  Chest:  Breasts:    Right: No swelling, bleeding, inverted nipple, mass, nipple discharge or skin change.     Left: No swelling, bleeding, inverted nipple, mass, nipple discharge or skin change.  Abdominal:     General: Bowel sounds are normal. There is no distension.     Palpations: Abdomen is soft. There is no mass.      Tenderness: There is no abdominal tenderness.  Musculoskeletal:        General: No tenderness.     Cervical back: Normal range of motion and neck supple.     Right lower leg: No edema.     Left lower leg: No edema.  Lymphadenopathy:     Cervical: No cervical adenopathy.     Right cervical: No superficial, deep or posterior cervical adenopathy.    Left cervical: No superficial, deep or posterior cervical adenopathy.     Upper Body:     Right upper body: No supraclavicular or axillary adenopathy.     Left upper body: No supraclavicular or axillary adenopathy.     Lower Body: No right inguinal adenopathy. No left inguinal adenopathy.  Skin:    Coloration: Skin is not jaundiced.     Findings: No lesion or rash.  Neurological:     General: No focal deficit present.     Mental Status: She is alert and oriented to person, place, and time. Mental status is at baseline.  Psychiatric:        Mood and Affect: Mood normal.        Behavior: Behavior normal.        Thought Content: Thought content normal.        Judgment: Judgment normal.    ASSESSMENT & PLAN:  Assessment/Plan:  A 72 y.o. female with  stage IA hormone positive breast cancer, status post a left breast lumpectomy in August 2018.  She also completed adjuvant breast radiation and her 5 years of adjuvant endocrine therapy.  Based upon her clinical breast exam today and her recent mammogram, the patient remains disease free.  Clinically, the patient appears to be doing well.  I do feel comfortable spacing her appointments out to once per year.  I will ensure her annual mammogram is done in October 2024 for her continued radiographic breast cancer surveillance.  The patient understands all the plans discussed today and is in agreement with them.  Arnella Pralle Kirby Funk, MD

## 2023-02-26 ENCOUNTER — Inpatient Hospital Stay: Payer: 59 | Attending: Oncology | Admitting: Oncology

## 2023-03-03 DIAGNOSIS — H16142 Punctate keratitis, left eye: Secondary | ICD-10-CM | POA: Diagnosis not present

## 2023-04-01 DIAGNOSIS — J441 Chronic obstructive pulmonary disease with (acute) exacerbation: Secondary | ICD-10-CM | POA: Diagnosis not present

## 2023-04-01 DIAGNOSIS — Z6821 Body mass index (BMI) 21.0-21.9, adult: Secondary | ICD-10-CM | POA: Diagnosis not present

## 2023-04-01 DIAGNOSIS — J449 Chronic obstructive pulmonary disease, unspecified: Secondary | ICD-10-CM | POA: Diagnosis not present

## 2023-04-01 DIAGNOSIS — G4700 Insomnia, unspecified: Secondary | ICD-10-CM | POA: Diagnosis not present

## 2023-04-09 DIAGNOSIS — L02415 Cutaneous abscess of right lower limb: Secondary | ICD-10-CM | POA: Diagnosis not present

## 2023-04-09 DIAGNOSIS — L0100 Impetigo, unspecified: Secondary | ICD-10-CM | POA: Diagnosis not present

## 2023-04-09 DIAGNOSIS — L97911 Non-pressure chronic ulcer of unspecified part of right lower leg limited to breakdown of skin: Secondary | ICD-10-CM | POA: Diagnosis not present

## 2023-05-07 DIAGNOSIS — L0100 Impetigo, unspecified: Secondary | ICD-10-CM | POA: Diagnosis not present

## 2023-05-07 DIAGNOSIS — L97911 Non-pressure chronic ulcer of unspecified part of right lower leg limited to breakdown of skin: Secondary | ICD-10-CM | POA: Diagnosis not present

## 2023-05-07 DIAGNOSIS — L02415 Cutaneous abscess of right lower limb: Secondary | ICD-10-CM | POA: Diagnosis not present

## 2023-05-08 DIAGNOSIS — M810 Age-related osteoporosis without current pathological fracture: Secondary | ICD-10-CM | POA: Diagnosis not present

## 2023-05-21 DIAGNOSIS — G4700 Insomnia, unspecified: Secondary | ICD-10-CM | POA: Diagnosis not present

## 2023-05-21 DIAGNOSIS — E785 Hyperlipidemia, unspecified: Secondary | ICD-10-CM | POA: Diagnosis not present

## 2023-05-21 DIAGNOSIS — Z6821 Body mass index (BMI) 21.0-21.9, adult: Secondary | ICD-10-CM | POA: Diagnosis not present

## 2023-06-04 DIAGNOSIS — G894 Chronic pain syndrome: Secondary | ICD-10-CM | POA: Diagnosis not present

## 2023-06-04 DIAGNOSIS — Z79899 Other long term (current) drug therapy: Secondary | ICD-10-CM | POA: Diagnosis not present

## 2023-06-04 DIAGNOSIS — M5459 Other low back pain: Secondary | ICD-10-CM | POA: Diagnosis not present

## 2023-06-04 DIAGNOSIS — M25579 Pain in unspecified ankle and joints of unspecified foot: Secondary | ICD-10-CM | POA: Diagnosis not present

## 2023-06-04 DIAGNOSIS — M961 Postlaminectomy syndrome, not elsewhere classified: Secondary | ICD-10-CM | POA: Diagnosis not present

## 2023-06-04 DIAGNOSIS — Z79891 Long term (current) use of opiate analgesic: Secondary | ICD-10-CM | POA: Diagnosis not present

## 2023-07-21 DIAGNOSIS — Z79899 Other long term (current) drug therapy: Secondary | ICD-10-CM | POA: Diagnosis not present

## 2023-07-21 DIAGNOSIS — G4700 Insomnia, unspecified: Secondary | ICD-10-CM | POA: Diagnosis not present

## 2023-07-21 DIAGNOSIS — J449 Chronic obstructive pulmonary disease, unspecified: Secondary | ICD-10-CM | POA: Diagnosis not present

## 2023-07-21 DIAGNOSIS — Z6822 Body mass index (BMI) 22.0-22.9, adult: Secondary | ICD-10-CM | POA: Diagnosis not present

## 2023-07-21 DIAGNOSIS — E785 Hyperlipidemia, unspecified: Secondary | ICD-10-CM | POA: Diagnosis not present

## 2023-08-06 DIAGNOSIS — L0100 Impetigo, unspecified: Secondary | ICD-10-CM | POA: Diagnosis not present

## 2023-08-06 DIAGNOSIS — L97911 Non-pressure chronic ulcer of unspecified part of right lower leg limited to breakdown of skin: Secondary | ICD-10-CM | POA: Diagnosis not present

## 2023-08-19 DIAGNOSIS — M810 Age-related osteoporosis without current pathological fracture: Secondary | ICD-10-CM | POA: Diagnosis not present

## 2023-09-11 DIAGNOSIS — L97911 Non-pressure chronic ulcer of unspecified part of right lower leg limited to breakdown of skin: Secondary | ICD-10-CM | POA: Diagnosis not present

## 2023-09-11 DIAGNOSIS — L820 Inflamed seborrheic keratosis: Secondary | ICD-10-CM | POA: Diagnosis not present

## 2023-09-11 DIAGNOSIS — H61001 Unspecified perichondritis of right external ear: Secondary | ICD-10-CM | POA: Diagnosis not present

## 2023-09-17 DIAGNOSIS — G4700 Insomnia, unspecified: Secondary | ICD-10-CM | POA: Diagnosis not present

## 2023-09-17 DIAGNOSIS — Z6821 Body mass index (BMI) 21.0-21.9, adult: Secondary | ICD-10-CM | POA: Diagnosis not present

## 2023-10-04 DIAGNOSIS — Z79891 Long term (current) use of opiate analgesic: Secondary | ICD-10-CM | POA: Diagnosis not present

## 2023-10-04 DIAGNOSIS — G894 Chronic pain syndrome: Secondary | ICD-10-CM | POA: Diagnosis not present

## 2023-10-04 DIAGNOSIS — M961 Postlaminectomy syndrome, not elsewhere classified: Secondary | ICD-10-CM | POA: Diagnosis not present

## 2023-10-04 DIAGNOSIS — G8929 Other chronic pain: Secondary | ICD-10-CM | POA: Diagnosis not present

## 2023-10-10 DIAGNOSIS — L97911 Non-pressure chronic ulcer of unspecified part of right lower leg limited to breakdown of skin: Secondary | ICD-10-CM | POA: Diagnosis not present

## 2023-10-10 DIAGNOSIS — L0100 Impetigo, unspecified: Secondary | ICD-10-CM | POA: Diagnosis not present

## 2023-11-17 DIAGNOSIS — Z6821 Body mass index (BMI) 21.0-21.9, adult: Secondary | ICD-10-CM | POA: Diagnosis not present

## 2023-11-17 DIAGNOSIS — E785 Hyperlipidemia, unspecified: Secondary | ICD-10-CM | POA: Diagnosis not present

## 2023-11-17 DIAGNOSIS — G4700 Insomnia, unspecified: Secondary | ICD-10-CM | POA: Diagnosis not present

## 2024-01-18 DIAGNOSIS — E785 Hyperlipidemia, unspecified: Secondary | ICD-10-CM | POA: Diagnosis not present

## 2024-01-18 DIAGNOSIS — Z6821 Body mass index (BMI) 21.0-21.9, adult: Secondary | ICD-10-CM | POA: Diagnosis not present

## 2024-01-18 DIAGNOSIS — M810 Age-related osteoporosis without current pathological fracture: Secondary | ICD-10-CM | POA: Diagnosis not present

## 2024-01-18 DIAGNOSIS — J449 Chronic obstructive pulmonary disease, unspecified: Secondary | ICD-10-CM | POA: Diagnosis not present

## 2024-01-18 DIAGNOSIS — G4700 Insomnia, unspecified: Secondary | ICD-10-CM | POA: Diagnosis not present

## 2024-01-18 DIAGNOSIS — Z79899 Other long term (current) drug therapy: Secondary | ICD-10-CM | POA: Diagnosis not present

## 2024-03-22 LAB — HM MAMMOGRAPHY

## 2024-04-07 ENCOUNTER — Encounter: Payer: Self-pay | Admitting: Oncology
